# Patient Record
Sex: Male | Born: 1935
Health system: Southern US, Community
[De-identification: ages and names within clinical notes are randomized; demographics above are authoritative.]

## PROBLEM LIST (undated history)

## (undated) DIAGNOSIS — E559 Vitamin D deficiency, unspecified: Secondary | ICD-10-CM

## (undated) DIAGNOSIS — K649 Unspecified hemorrhoids: Secondary | ICD-10-CM

## (undated) DIAGNOSIS — J189 Pneumonia, unspecified organism: Secondary | ICD-10-CM

## (undated) DIAGNOSIS — K219 Gastro-esophageal reflux disease without esophagitis: Secondary | ICD-10-CM

## (undated) DIAGNOSIS — I10 Essential (primary) hypertension: Secondary | ICD-10-CM

## (undated) DIAGNOSIS — R972 Elevated prostate specific antigen [PSA]: Secondary | ICD-10-CM

## (undated) DIAGNOSIS — C801 Malignant (primary) neoplasm, unspecified: Secondary | ICD-10-CM

## (undated) DIAGNOSIS — E785 Hyperlipidemia, unspecified: Secondary | ICD-10-CM

## (undated) HISTORY — DX: Hyperlipidemia, unspecified: E78.5

## (undated) HISTORY — DX: Gastro-esophageal reflux disease without esophagitis: K21.9

## (undated) HISTORY — PX: HERNIA REPAIR: SHX51

## (undated) HISTORY — DX: Essential (primary) hypertension: I10

## (undated) HISTORY — DX: Elevated prostate specific antigen (PSA): R97.20

## (undated) HISTORY — DX: Vitamin D deficiency, unspecified: E55.9

---

## 1999-08-08 ENCOUNTER — Encounter (INDEPENDENT_AMBULATORY_CARE_PROVIDER_SITE_OTHER): Payer: Self-pay

## 1999-08-08 ENCOUNTER — Ambulatory Visit (HOSPITAL_COMMUNITY): Admission: RE | Admit: 1999-08-08 | Discharge: 1999-08-08 | Payer: Self-pay | Admitting: Gastroenterology

## 2008-01-08 ENCOUNTER — Ambulatory Visit (HOSPITAL_COMMUNITY): Admission: RE | Admit: 2008-01-08 | Discharge: 2008-01-08 | Payer: Self-pay | Admitting: Pulmonary Disease

## 2009-01-28 ENCOUNTER — Emergency Department (HOSPITAL_COMMUNITY): Admission: EM | Admit: 2009-01-28 | Discharge: 2009-01-28 | Payer: Self-pay | Admitting: Emergency Medicine

## 2009-04-25 LAB — HM COLONOSCOPY

## 2010-09-04 ENCOUNTER — Ambulatory Visit (HOSPITAL_COMMUNITY)
Admission: RE | Admit: 2010-09-04 | Discharge: 2010-09-04 | Payer: Self-pay | Source: Home / Self Care | Admitting: General Surgery

## 2010-12-19 LAB — DIFFERENTIAL
Eosinophils Absolute: 0.3 10*3/uL (ref 0.0–0.7)
Eosinophils Relative: 5 % (ref 0–5)
Lymphs Abs: 1.6 10*3/uL (ref 0.7–4.0)
Monocytes Absolute: 0.5 10*3/uL (ref 0.1–1.0)
Monocytes Relative: 9 % (ref 3–12)

## 2010-12-19 LAB — CBC
HCT: 42.4 % (ref 39.0–52.0)
Hemoglobin: 14.4 g/dL (ref 13.0–17.0)
MCH: 30.6 pg (ref 26.0–34.0)
MCV: 90.3 fL (ref 78.0–100.0)
RBC: 4.69 MIL/uL (ref 4.22–5.81)

## 2010-12-19 LAB — BASIC METABOLIC PANEL
CO2: 30 mEq/L (ref 19–32)
Chloride: 107 mEq/L (ref 96–112)
GFR calc Af Amer: >60 mL/min (ref >60)
Sodium: 143 mEq/L (ref 135–145)

## 2011-01-17 LAB — COMPREHENSIVE METABOLIC PANEL
Alkaline Phosphatase: 51 U/L (ref 39–117)
BUN: 14 mg/dL (ref 6–23)
Chloride: 100 mEq/L (ref 96–112)
GFR calc non Af Amer: >60 mL/min (ref >60)
Glucose, Bld: 135 mg/dL — ABNORMAL HIGH (ref 70–99)
Potassium: 3.9 mEq/L (ref 3.5–5.1)
Total Bilirubin: 1.1 mg/dL (ref 0.3–1.2)
Total Protein: 6.2 g/dL (ref 6.0–8.3)

## 2011-01-17 LAB — URINALYSIS, ROUTINE W REFLEX MICROSCOPIC
Glucose, UA: NEGATIVE mg/dL
Hgb urine dipstick: NEGATIVE
Protein, ur: NEGATIVE mg/dL

## 2011-01-17 LAB — DIFFERENTIAL
Basophils Absolute: 0 10*3/uL (ref 0.0–0.1)
Basophils Relative: 0 % (ref 0–1)
Neutro Abs: 6.8 10*3/uL (ref 1.7–7.7)
Neutrophils Relative %: 80 % — ABNORMAL HIGH (ref 43–77)

## 2011-01-17 LAB — CBC
HCT: 44.1 % (ref 39.0–52.0)
Hemoglobin: 15 g/dL (ref 13.0–17.0)
RDW: 13.1 % (ref 11.5–15.5)

## 2011-01-17 LAB — LACTIC ACID, PLASMA: Lactic Acid, Venous: 1.2 mmol/L (ref 0.5–2.2)

## 2011-01-17 LAB — LIPASE, BLOOD: Lipase: 20 U/L (ref 11–59)

## 2011-03-13 ENCOUNTER — Ambulatory Visit (HOSPITAL_COMMUNITY)
Admission: RE | Admit: 2011-03-13 | Discharge: 2011-03-13 | Disposition: A | Discharge status: Home / Self Care | Payer: Medicare Other | Source: Ambulatory Visit | Attending: Internal Medicine | Admitting: Internal Medicine

## 2011-03-13 ENCOUNTER — Other Ambulatory Visit (HOSPITAL_COMMUNITY): Payer: Self-pay | Admitting: Internal Medicine

## 2011-03-13 DIAGNOSIS — M546 Pain in thoracic spine: Secondary | ICD-10-CM | POA: Insufficient documentation

## 2011-03-13 DIAGNOSIS — M4120 Other idiopathic scoliosis, site unspecified: Secondary | ICD-10-CM | POA: Insufficient documentation

## 2011-03-13 DIAGNOSIS — M25519 Pain in unspecified shoulder: Secondary | ICD-10-CM | POA: Insufficient documentation

## 2011-03-13 DIAGNOSIS — I77810 Thoracic aortic ectasia: Secondary | ICD-10-CM | POA: Insufficient documentation

## 2011-03-13 DIAGNOSIS — R52 Pain, unspecified: Secondary | ICD-10-CM

## 2011-03-13 DIAGNOSIS — M479 Spondylosis, unspecified: Secondary | ICD-10-CM | POA: Insufficient documentation

## 2013-08-28 ENCOUNTER — Other Ambulatory Visit: Payer: Self-pay | Admitting: Physician Assistant

## 2013-08-28 MED ORDER — CLOTRIMAZOLE-BETAMETHASONE 1-0.05 % EX CREA: 1.0000 "application " | TOPICAL_CREAM | Freq: Two times a day (BID) | CUTANEOUS | Status: DC

## 2013-09-13 ENCOUNTER — Encounter: Payer: Self-pay | Admitting: Internal Medicine

## 2013-09-13 DIAGNOSIS — E782 Mixed hyperlipidemia: Secondary | ICD-10-CM | POA: Insufficient documentation

## 2013-09-13 DIAGNOSIS — R0989 Other specified symptoms and signs involving the circulatory and respiratory systems: Secondary | ICD-10-CM | POA: Insufficient documentation

## 2013-09-13 DIAGNOSIS — K219 Gastro-esophageal reflux disease without esophagitis: Secondary | ICD-10-CM | POA: Insufficient documentation

## 2013-09-13 DIAGNOSIS — E559 Vitamin D deficiency, unspecified: Secondary | ICD-10-CM | POA: Insufficient documentation

## 2013-09-13 DIAGNOSIS — R972 Elevated prostate specific antigen [PSA]: Secondary | ICD-10-CM | POA: Insufficient documentation

## 2013-09-14 ENCOUNTER — Encounter: Payer: Self-pay | Admitting: Internal Medicine

## 2013-09-14 ENCOUNTER — Ambulatory Visit (INDEPENDENT_AMBULATORY_CARE_PROVIDER_SITE_OTHER): Payer: Medicare Other | Admitting: Internal Medicine

## 2013-09-14 VITALS — BP 138/70 | HR 72 | Temp 97.9°F | Resp 18 | Wt 164.6 lb

## 2013-09-14 DIAGNOSIS — D485 Neoplasm of uncertain behavior of skin: Secondary | ICD-10-CM

## 2013-09-14 DIAGNOSIS — R7303 Prediabetes: Secondary | ICD-10-CM | POA: Insufficient documentation

## 2013-09-14 DIAGNOSIS — I10 Essential (primary) hypertension: Secondary | ICD-10-CM

## 2013-09-14 DIAGNOSIS — M839 Adult osteomalacia, unspecified: Secondary | ICD-10-CM

## 2013-09-14 DIAGNOSIS — E782 Mixed hyperlipidemia: Secondary | ICD-10-CM

## 2013-09-14 DIAGNOSIS — R7309 Other abnormal glucose: Secondary | ICD-10-CM

## 2013-09-14 DIAGNOSIS — Z79899 Other long term (current) drug therapy: Secondary | ICD-10-CM

## 2013-09-14 DIAGNOSIS — E559 Vitamin D deficiency, unspecified: Secondary | ICD-10-CM

## 2013-09-14 LAB — LIPID PANEL
Cholesterol: 149 mg/dL (ref 0–200)
HDL: 44 mg/dL (ref >39)
LDL Cholesterol: 74 mg/dL (ref 0–99)
Triglycerides: 155 mg/dL — ABNORMAL HIGH (ref <150)

## 2013-09-14 LAB — HEPATIC FUNCTION PANEL
Albumin: 3.7 g/dL (ref 3.5–5.2)
Alkaline Phosphatase: 65 U/L (ref 39–117)
Total Bilirubin: 1.3 mg/dL — ABNORMAL HIGH (ref 0.3–1.2)

## 2013-09-14 LAB — BASIC METABOLIC PANEL WITH GFR
Chloride: 104 mEq/L (ref 96–112)
Creat: 0.81 mg/dL (ref 0.50–1.35)
GFR, Est African American: >89 mL/min
GFR, Est Non African American: 85 mL/min
Potassium: 4.3 mEq/L (ref 3.5–5.3)

## 2013-09-14 LAB — CBC WITH DIFFERENTIAL/PLATELET
Basophils Absolute: 0 10*3/uL (ref 0.0–0.1)
Basophils Relative: 1 % (ref 0–1)
Lymphocytes Relative: 25 % (ref 12–46)
Neutro Abs: 3.8 10*3/uL (ref 1.7–7.7)
Platelets: 168 10*3/uL (ref 150–400)
RDW: 13.9 % (ref 11.5–15.5)
WBC: 6 10*3/uL (ref 4.0–10.5)

## 2013-09-14 LAB — HEMOGLOBIN A1C: Mean Plasma Glucose: 126 mg/dL — ABNORMAL HIGH (ref <117)

## 2013-09-14 NOTE — Patient Instructions (Signed)

## 2013-09-14 NOTE — Progress Notes (Signed)
Patient ID: Keith [name], male   DOB: 1931/10/20, 79 y.o.   MRN: [id number]   This very nice 79 yo MWM presents for 3 month follow up with Hypertension, Hyperlipidemia, Pre-Diabetes and Vitamin D Deficiency and also for excision of a suspicious skin lesion of his Lt upper back.    BP has been controlled at home. Today's BP is 138/70. Patient denies any cardiac type chest pain, palpitations, dyspnea/orthopnea/PND, dizziness, claudication, or dependent edema.   Hyperlipidemia is controlled with diet & meds. Last Cholesterol was 168, Triglycerides were 142, HDL 49 and LDL 83 in August - all at goal. Patient denies myalgias or other med SE's.    Also, the patient has history of PreDiabetes with last A1c of 5.8% in August ( was 5.9% in February). Patient denies any symptoms of reactive hypoglycemia, diabetic polys, paresthesias or visual blurring.   Further, Patient has history of Vitamin D Deficiency with last vitamin D of 60 in August (was 27 in 2009). Patient supplements vitamin D without any suspected side-effects.  Current Outpatient Prescriptions on File Prior to Visit  Medication Sig Dispense Refill  . atorvastatin (LIPITOR) 40 MG tablet Take 40 mg by mouth daily.      . Cholecalciferol (VITAMIN D PO) Take 2,000 Int'l Units by mouth daily.      . clotrimazole-betamethasone (LOTRISONE) cream Apply 1 application topically 2 (two) times daily.  15 g  2  . Ranitidine HCl (ZANTAC PO) Take by mouth daily.         Allergies  Allergen Reactions  . Penicillins     PMHx:   Past Medical History  Diagnosis Date  . Hypertension   . Hyperlipidemia   . GERD (gastroesophageal reflux disease)   . Vitamin D deficiency   . Elevated PSA     FHx:    Reviewed / unchanged  SHx:    Reviewed / unchanged  Systems Review: Constitutional: Denies fever, chills, wt changes, headaches, insomnia, fatigue, night sweats, change in appetite. Eyes: Denies redness, blurred vision, diplopia, discharge, itchy,  watery eyes.  ENT: Denies discharge, congestion, post nasal drip, epistaxis, sore throat, earache, hearing loss, dental pain, tinnitus, vertigo, sinus pain, snoring.  CV: Denies chest pain, palpitations, irregular heartbeat, syncope, dyspnea, diaphoresis, orthopnea, PND, claudication, edema. Respiratory: denies cough, dyspnea, DOE, pleurisy, hoarseness, laryngitis, wheezing.  Gastrointestinal: Denies dysphagia, odynophagia, heartburn, reflux, water brash, abdominal pain or cramps, nausea, vomiting, bloating, diarrhea, constipation, hematemesis, melena, hematochezia,  or hemorrhoids. Genitourinary: Denies dysuria, frequency, urgency, nocturia, hesitancy, discharge, hematuria, flank pain. Musculoskeletal: Denies arthralgias, myalgias, stiffness, jt. swelling, pain, limp, strain/sprain.  Skin: Denies pruritus, rash, hives, warts, acne, eczema, but does have suspicious skin lesion as above.. Neuro: No weakness, tremor, incoordination, spasms, paresthesia, or pain. Psychiatric: Denies confusion, memory loss, or sensory loss. Endo: Denies change in weight, skin, hair change.  Heme/Lymph: No excessive bleeding, bruising, or enlarged lymph nodes.  Filed Vitals:   09/14/13 0901  BP: 138/70  Pulse: 72  Temp: 97.9 F (36.6 C)  Resp: 18    There is no height on file to calculate BMI.  On Exam: Appears well nourished - in no distress. Eyes: PERRLA, EOMs, conjunctiva no swelling or erythema. Sinuses: No frontal/maxillary tenderness ENT/Mouth: EAC's clear, TM's nl w/o erythema, bulging. Nares clear w/o erythema, swelling, exudates. Oropharynx clear without erythema or exudates. Oral hygiene is good. Tongue normal, non obstructing. Hearing intact.  Neck: Supple. Thyroid nl. Car 2+/2+ without bruits, nodes or JVD. Chest: Respirations nl with BS clear &  equal w/o rales, rhonchi, wheezing or stridor.  Cor: Heart sounds normal w/ regular rate and rhythm without sig. murmurs, gallops, clicks, or rubs.  Peripheral pulses normal and equal  without edema.  Abdomen: Soft & bowel sounds normal. Non-tender w/o guarding, rebound, hernias, masses, or organomegaly.  Lymphatics: Unremarkable.  Musculoskeletal: Full ROM all peripheral extremities, joint stability, 5/5 strength, and normal gait.  Skin: Warm, dry without exposed rashes, lesions, ecchymosis apparent.  Neuro: Cranial nerves intact, reflexes equal bilaterally. Sensory-motor testing grossly intact. Tendon reflexes grossly intact.  Pysch: Alert & oriented x 3. Insight and judgement nl & appropriate. No ideations.  Assessment and Plan:  1. Hypertension - Continue monitor blood pressure at home. Continue diet/meds same.  2. Hyperlipidemia - Continue diet/meds, exercise,& lifestyle modifications. Continue monitor periodic cholesterol/liver & renal functions   3. Pre-diabetes/Insulin Resistance - Continue diet, exercise, lifestyle modifications. Monitor appropriate labs.  4. Vitamin D Deficiency - Continue supplementation.  Recommended regular exercise, BP monitoring, weight control, and discussed med and SE's. Recommended labs to assess and monitor clinical status. Further disposition pending results of labs.  ================================================================================  Exam: there is an 8 x 12 mm erythematous raised crusty lesion in the Left upper back area    The risks, benefits, indications, potential complications, and alternatives were explained to the patient.  Procedure Details  After informed consent and local anesthesia with xylocaine   marcaine with epi 1%, the area was prepped by sterile/aseptic technique with isopropyl alcohol.  A  # 15 blade was used to excise an elliptical area of skin approximately   12 mm x 18mm with an ~ 1-2 mm margin around the lesion. The lesion was sharply dissected and delivered for pathologic examination. The wound was closed with   using # 6 vertical mattress  sutures with Nylon  3-0.  Antibiotic ointment and a sterile dressing applied and covered with a 4" x 6" tegoderm gauzeThe specimen was sent for pathologic examination. The patient tolerated the procedure well with minimal blood loss.   Condition: Stable  Complications:  None  Diagnosis:  1. Skin lesion, uknown significance - r/o atypia   Procedure code: 16109   Plan: 1.Patient was instructed to keep the wound dry and covered for 24-48 hours and clean thereafter. 2. Warning signs of infection were reviewed & pt advised to call if any questions/problems.    3. Recommended that the patient use Tylenol, Advil or Aleve as needed for pain.   4. Return for suture removal in  2 weeks

## 2013-09-15 LAB — INSULIN, FASTING: Insulin fasting, serum: 12 u[IU]/mL (ref 3–28)

## 2013-09-15 LAB — VITAMIN D 25 HYDROXY (VIT D DEFICIENCY, FRACTURES): Vit D, 25-Hydroxy: 61 ng/mL (ref 30–89)

## 2013-09-16 ENCOUNTER — Telehealth: Payer: Self-pay | Admitting: *Deleted

## 2013-09-16 ENCOUNTER — Other Ambulatory Visit: Payer: Self-pay | Admitting: Internal Medicine

## 2013-09-16 DIAGNOSIS — K2100 Gastro-esophageal reflux disease with esophagitis, without bleeding: Secondary | ICD-10-CM

## 2013-09-16 DIAGNOSIS — E782 Mixed hyperlipidemia: Secondary | ICD-10-CM

## 2013-09-16 MED ORDER — ATORVASTATIN CALCIUM 40 MG PO TABS: 40.0000 mg | ORAL_TABLET | Freq: Every day | ORAL | Status: DC

## 2013-09-16 MED ORDER — RANITIDINE HCL 300 MG PO TABS: 300.0000 mg | ORAL_TABLET | Freq: Every day | ORAL | Status: DC

## 2013-09-16 NOTE — Telephone Encounter (Signed)
Message copied by Reggy Eye on Wed Sep 16, 2013 12:48 PM ------      Message from: Lucky Cowboy      Created: Tue Sep 15, 2013 11:21 PM       A1c 6.0% and ave glucose 126 in early diabetic range - need stricter diet - avoid sweets/candy- white rice- white potatoes - whote flour- pizza crust and white pasta -       Vegetarian pastas - brown/wild rice - sweet potatoes and multigrain breads ok      Vit D 61 - sl low ideal betw 70 to 100 - suggest add 2,000 units more on a daily basis       All else nl and ok ------

## 2013-09-28 ENCOUNTER — Ambulatory Visit: Payer: Commercial Managed Care - HMO | Admitting: Internal Medicine

## 2013-09-28 ENCOUNTER — Encounter: Payer: Self-pay | Admitting: Internal Medicine

## 2013-09-28 VITALS — BP 122/64 | HR 60 | Temp 97.7°F | Resp 16 | Wt 164.9 lb

## 2013-09-28 DIAGNOSIS — D485 Neoplasm of uncertain behavior of skin: Secondary | ICD-10-CM

## 2013-09-28 NOTE — Progress Notes (Signed)
Subjective:     Patient ID: Keith [name], male   DOB: July 05, 1932, 79 y.o.   MRN: [id number]  HPI   F/U post excisional Bx of lesion Left upper back - path report still pending   Review of Systems denies problems after the Bx     Objective:   Physical Exam Area of recent Bx appears clean and well healed w/o sign of infection - sutures removed and bacitracin applied and covered with 2"x2" gauze      Assessment:     Skin lesion of ukn significance (238.2)      Plan:     F/u pending path

## 2013-10-09 ENCOUNTER — Encounter: Payer: Self-pay | Admitting: Internal Medicine

## 2013-10-09 ENCOUNTER — Ambulatory Visit (INDEPENDENT_AMBULATORY_CARE_PROVIDER_SITE_OTHER): Payer: Medicare HMO | Admitting: Internal Medicine

## 2013-10-09 VITALS — BP 132/80 | HR 64 | Temp 98.1°F | Resp 18 | Wt 165.8 lb

## 2013-10-09 DIAGNOSIS — L723 Sebaceous cyst: Secondary | ICD-10-CM

## 2013-10-09 NOTE — Progress Notes (Signed)
Subjective:     Patient ID: Keith [name], male   DOB: 10-01-1932, 80 y.o.   MRN: [id number]  HPI patients presents for concern about recent wound site of Lt upper back appearing slightly red and concerned re: possible infection   Review of Systems no fever and no tenderness or drainage from the wound site     Objective:   Physical Exam    Wound site appears clean with slight  separation at incision and no tenderness , erythema, warmth, drainage or signs of cellulitis. There is normal appearing redness of healing at the wound margins . Wound base is clean and dry.     Assessment:     Healing wound by 2sd intention w/o signs of infection.    Plan:     Patient reassured and advised shower as usual and cleanse area with soap and water, blot dry & leave open to air. RTC as needed.

## 2013-10-23 ENCOUNTER — Encounter: Payer: Self-pay | Admitting: Physician Assistant

## 2013-10-23 ENCOUNTER — Ambulatory Visit (INDEPENDENT_AMBULATORY_CARE_PROVIDER_SITE_OTHER): Payer: Medicare HMO | Admitting: Physician Assistant

## 2013-10-23 VITALS — BP 130/62 | HR 60 | Temp 97.9°F | Resp 16 | Wt 165.0 lb

## 2013-10-23 DIAGNOSIS — L259 Unspecified contact dermatitis, unspecified cause: Secondary | ICD-10-CM

## 2013-10-23 MED ORDER — PERMETHRIN 5 % EX CREA: TOPICAL_CREAM | CUTANEOUS | Status: DC

## 2013-10-23 MED ORDER — PREDNISONE 20 MG PO TABS: ORAL_TABLET | ORAL | Status: DC

## 2013-10-23 MED ORDER — HYDROXYZINE HCL 25 MG PO TABS: 25.0000 mg | ORAL_TABLET | Freq: Four times a day (QID) | ORAL | Status: DC | PRN

## 2013-10-23 NOTE — Patient Instructions (Signed)
Please stop the Vitamin D, fish oil and the lipitor for now. Use dove soap and no dryer sheets.  Start the prednisone and follow up in 2-4 weeks.   Rash A rash is a change in the color or texture of your skin. There are many different types of rashes. You may have other problems that accompany your rash. CAUSES   Infections.  Allergic reactions. This can include allergies to pets or foods.  Certain medicines.  Exposure to certain chemicals, soaps, or cosmetics.  Heat.  Exposure to poisonous plants.  Tumors, both cancerous and noncancerous. SYMPTOMS   Redness.  Scaly skin.  Itchy skin.  Dry or cracked skin.  Bumps.  Blisters.  Pain. DIAGNOSIS  Your caregiver may do a physical exam to determine what type of rash you have. A skin sample (biopsy) may be taken and examined under a microscope. TREATMENT  Treatment depends on the type of rash you have. Your caregiver may prescribe certain medicines. For serious conditions, you may need to see a skin doctor (dermatologist). HOME CARE INSTRUCTIONS   Avoid the substance that caused your rash.  Do not scratch your rash. This can cause infection.  You may take cool baths to help stop itching.  Only take over-the-counter or prescription medicines as directed by your caregiver.  Keep all follow-up appointments as directed by your caregiver. SEEK IMMEDIATE MEDICAL CARE IF:  You have increasing pain, swelling, or redness.  You have a fever.  You have new or severe symptoms.  You have body aches, diarrhea, or vomiting.  Your rash is not better after 3 days. MAKE SURE YOU:  Understand these instructions.  Will watch your condition.  Will get help right away if you are not doing well or get worse. Document Released: 09/14/2002 Document Revised: 12/17/2011 Document Reviewed: 07/09/2011 [facility] Patient Information 2014 [place], [place].

## 2013-10-23 NOTE — Progress Notes (Signed)
   Subjective:    Patient ID: Keith [name], male    DOB: April 26, 1932, 80 y.o.   MRN: [id number]  Rash This is a recurrent problem. Episode onset: 2-3 months. The problem has been waxing and waning since onset. The rash is diffuse. The rash is characterized by itchiness and redness (grouped vesicles/macules). He was exposed to nothing. Pertinent negatives include no anorexia, congestion, cough, diarrhea, eye pain, facial edema, fatigue, fever, joint pain, nail changes, rhinorrhea, shortness of breath, sore throat or vomiting. Past treatments include topical steroids, anti-itch cream and antibiotic cream. The treatment provided mild relief.  ? Possible new addition of fish oil and increase in Vitamin D   Review of Systems  Constitutional: Negative.  Negative for fever, chills and fatigue.  HENT: Negative.  Negative for congestion, rhinorrhea and sore throat.   Eyes: Negative.  Negative for pain.  Respiratory: Negative.  Negative for cough and shortness of breath.   Cardiovascular: Negative.   Gastrointestinal: Negative.  Negative for vomiting, diarrhea and anorexia.  Endocrine: Negative.   Genitourinary: Negative.   Musculoskeletal: Negative.  Negative for joint pain.  Skin: Positive for rash and wound (nonhealing 15x8 mm ulcer on left back from mole removal). Negative for nail changes.  Neurological: Negative.   Hematological: Negative.        Objective:   Physical Exam  Constitutional: He is oriented to person, place, and time. He appears well-developed and well-nourished.  HENT:  Head: Normocephalic and atraumatic.  Right Ear: External ear normal.  Left Ear: External ear normal.  Mouth/Throat: Oropharynx is clear and moist.  Eyes: Conjunctivae and EOM are normal. Pupils are equal, round, and reactive to light.  Neck: Normal range of motion. Neck supple.  Cardiovascular: Normal rate, regular rhythm and normal heart sounds.   Pulmonary/Chest: Effort normal and breath sounds normal.   Abdominal: Soft. Bowel sounds are normal.  Musculoskeletal: Normal range of motion.  Neurological: He is alert and oriented to person, place, and time. No cranial nerve deficit.  Skin: Skin is warm and dry. Rash noted. Rash is maculopapular. No cyanosis. Nails show no clubbing.  nonhealing 15x8 mm erythematous ulcer without exudate/warmth on left back from mole removal In Dec.  Diffuse clustered macular/papular erythematous non blanchable rash along stomach, back, arms, legs.   Psychiatric: He has a normal mood and affect. His behavior is normal.       Assessment & Plan:  Contact dermatitis/Drug eruption - Plan: hydrOXYzine (ATARAX/VISTARIL) 25 MG tablet, predniSONE (DELTASONE) 20 MG tablet  Stop the fish oil, Vitamin D and Lipitor for now. Follow up in 2-4 weeks and we will restart the Lipitor first then Vitamin D, then maybe fish oil.

## 2013-10-27 ENCOUNTER — Other Ambulatory Visit: Payer: Self-pay

## 2013-10-27 DIAGNOSIS — L723 Sebaceous cyst: Secondary | ICD-10-CM

## 2013-10-27 DIAGNOSIS — Z1283 Encounter for screening for malignant neoplasm of skin: Secondary | ICD-10-CM

## 2013-11-12 ENCOUNTER — Encounter: Payer: Self-pay | Admitting: Physician Assistant

## 2013-11-12 ENCOUNTER — Ambulatory Visit (INDEPENDENT_AMBULATORY_CARE_PROVIDER_SITE_OTHER): Payer: Medicare HMO | Admitting: Physician Assistant

## 2013-11-12 VITALS — BP 120/62 | HR 64 | Temp 98.1°F | Resp 16 | Ht 71.5 in | Wt 164.0 lb

## 2013-11-12 DIAGNOSIS — L259 Unspecified contact dermatitis, unspecified cause: Secondary | ICD-10-CM

## 2013-11-12 DIAGNOSIS — R5381 Other malaise: Secondary | ICD-10-CM

## 2013-11-12 DIAGNOSIS — M2550 Pain in unspecified joint: Secondary | ICD-10-CM

## 2013-11-12 DIAGNOSIS — R5383 Other fatigue: Secondary | ICD-10-CM

## 2013-11-12 LAB — BASIC METABOLIC PANEL WITH GFR
BUN: 18 mg/dL (ref 6–23)
CO2: 29 mEq/L (ref 19–32)
CREATININE: 0.81 mg/dL (ref 0.50–1.35)
Calcium: 9.5 mg/dL (ref 8.4–10.5)
Chloride: 99 mEq/L (ref 96–112)
GFR, EST NON AFRICAN AMERICAN: 85 mL/min
Glucose, Bld: 88 mg/dL (ref 70–99)
Potassium: 4.7 mEq/L (ref 3.5–5.3)
SODIUM: 140 meq/L (ref 135–145)

## 2013-11-12 LAB — CBC WITH DIFFERENTIAL/PLATELET
BASOS ABS: 0 10*3/uL (ref 0.0–0.1)
BASOS PCT: 0 % (ref 0–1)
EOS ABS: 0.2 10*3/uL (ref 0.0–0.7)
Eosinophils Relative: 4 % (ref 0–5)
HCT: 46.4 % (ref 39.0–52.0)
HEMOGLOBIN: 15.5 g/dL (ref 13.0–17.0)
Lymphocytes Relative: 22 % (ref 12–46)
Lymphs Abs: 1.4 10*3/uL (ref 0.7–4.0)
MCH: 29.8 pg (ref 26.0–34.0)
MCHC: 33.4 g/dL (ref 30.0–36.0)
MCV: 89.2 fL (ref 78.0–100.0)
MONO ABS: 0.6 10*3/uL (ref 0.1–1.0)
MONOS PCT: 9 % (ref 3–12)
NEUTROS PCT: 65 % (ref 43–77)
Neutro Abs: 3.9 10*3/uL (ref 1.7–7.7)
Platelets: 158 10*3/uL (ref 150–400)
RBC: 5.2 MIL/uL (ref 4.22–5.81)
RDW: 14.1 % (ref 11.5–15.5)
WBC: 6.1 10*3/uL (ref 4.0–10.5)

## 2013-11-12 LAB — HEPATIC FUNCTION PANEL
ALT: 17 U/L (ref 0–53)
AST: 19 U/L (ref 0–37)
Albumin: 3.9 g/dL (ref 3.5–5.2)
Alkaline Phosphatase: 59 U/L (ref 39–117)
BILIRUBIN DIRECT: 0.2 mg/dL (ref 0.0–0.3)
BILIRUBIN TOTAL: 0.9 mg/dL (ref 0.2–1.2)
Indirect Bilirubin: 0.7 mg/dL (ref 0.2–1.2)
Total Protein: 6.4 g/dL (ref 6.0–8.3)

## 2013-11-12 LAB — SEDIMENTATION RATE: Sed Rate: 4 mm/hr (ref 0–16)

## 2013-11-12 MED ORDER — HYDROXYZINE HCL 25 MG PO TABS: 25.0000 mg | ORAL_TABLET | Freq: Four times a day (QID) | ORAL | Status: DC | PRN

## 2013-11-12 MED ORDER — DEXAMETHASONE SODIUM PHOSPHATE 100 MG/10ML IJ SOLN
10.0000 mg | Freq: Once | INTRAMUSCULAR | Status: AC
  Administered 2013-11-12: 10 mg via INTRAMUSCULAR

## 2013-11-12 NOTE — Progress Notes (Signed)
   Subjective:    Patient ID: Keith [name], male    DOB: Jan 03, 1932, 80 y.o.   MRN: [id number]  HPI Patient was here 10/23/2013 for recurrent rash. At that visit we stopped his lipitor, fish oil and vitamin D and put him on Prednisone and Atarax, he was doing better the first week but feels the rash is back on his back, ankles and arms. 8-9 months ago had a tick bit.    Review of Systems  Constitutional: Positive for fatigue. Negative for fever and chills.  HENT: Negative.  Negative for congestion, rhinorrhea and sore throat.   Eyes: Negative.  Negative for pain.  Respiratory: Negative.  Negative for cough and shortness of breath.   Cardiovascular: Negative.   Gastrointestinal: Negative.  Negative for vomiting, diarrhea and anorexia.  Endocrine: Negative.   Genitourinary: Negative.   Musculoskeletal: Negative.  Negative for joint pain.  Skin: Positive for rash and wound (nonhealing 15x8 mm ulcer on left back from mole removal). Negative for nail changes.  Neurological: Negative.   Hematological: Negative.        Objective:   Physical Exam  Constitutional: He is oriented to person, place, and time. He appears well-developed and well-nourished.  HENT:  Head: Normocephalic and atraumatic.  Right Ear: External ear normal.  Left Ear: External ear normal.  Mouth/Throat: Oropharynx is clear and moist.  Eyes: Conjunctivae and EOM are normal. Pupils are equal, round, and reactive to light.  Neck: Normal range of motion. Neck supple.  Cardiovascular: Normal rate, regular rhythm and normal heart sounds.   Pulmonary/Chest: Effort normal and breath sounds normal.  Abdominal: Soft. Bowel sounds are normal.  Musculoskeletal: Normal range of motion.  Neurological: He is alert and oriented to person, place, and time. No cranial nerve deficit.  Skin: Skin is warm and dry. Rash noted. Rash is maculopapular. No cyanosis. Nails show no clubbing.  Slowly healing 10x8 mm erythematous ulcer without  exudate/warmth on left back from mole removal In Dec.  Diffuse clustered macular/papular erythematous non blanchable rash along back, arms, legs with questionable burrows.   Psychiatric: He has a normal mood and affect. His behavior is normal.      Assessment & Plan:  Dermatitis/Scabies- get on elimite, wash clothes, Prednisone 56m IM, Atarax 272mrefill.   Will do referral to Derm and  if not better.   History of Tick bite with fatigue- check CBC, BMP, LFTs, ESR, Lyme and RMSF

## 2013-11-12 NOTE — Patient Instructions (Signed)
Scabies Scabies are small bugs (mites) that burrow under the skin and cause red bumps and severe itching. These bugs can only be seen with a microscope. Scabies are highly contagious. They can spread easily from person to person by direct contact. They are also spread through sharing clothing or linens that have the scabies mites living in them. It is not unusual for an entire family to become infected through shared towels, clothing, or bedding.  HOME CARE INSTRUCTIONS   Your caregiver may prescribe a cream or lotion to kill the mites. If cream is prescribed, massage the cream into the entire body from the neck to the bottom of both feet. Also massage the cream into the scalp and face if your child is less than 44 year old. Avoid the eyes and mouth. Do not wash your hands after application.  Leave the cream on for 8 to 12 hours. Your child should bathe or shower after the 8 to 12 hour application period. Sometimes it is helpful to apply the cream to your child right before bedtime.  One treatment is usually effective and will eliminate approximately 95% of infestations. For severe cases, your caregiver may decide to repeat the treatment in 1 week. Everyone in your household should be treated with one application of the cream.  New rashes or burrows should not appear within 24 to 48 hours after successful treatment. However, the itching and rash may last for 2 to 4 weeks after successful treatment. Your caregiver may prescribe a medicine to help with the itching or to help the rash go away more quickly.  Scabies can live on clothing or linens for up to 3 days. All of your child's recently used clothing, towels, stuffed toys, and bed linens should be washed in hot water and then dried in a dryer for at least 20 minutes on high heat. Items that cannot be washed should be enclosed in a plastic bag for at least 3 days.  To help relieve itching, bathe your child in a cool bath or apply cool washcloths to the  affected areas.  Your child may return to school after treatment with the prescribed cream. SEEK MEDICAL CARE IF:   The itching persists longer than 4 weeks after treatment.  The rash spreads or becomes infected. Signs of infection include red blisters or yellow-tan crust. Document Released: 09/24/2005 Document Revised: 12/17/2011 Document Reviewed: 02/02/2009 [facility] Patient Information 2014 [place]. Contact Dermatitis Contact dermatitis is a reaction to certain substances that touch the skin. Contact dermatitis can be either irritant contact dermatitis or allergic contact dermatitis. Irritant contact dermatitis does not require previous exposure to the substance for a reaction to occur.Allergic contact dermatitis only occurs if you have been exposed to the substance before. Upon a repeat exposure, your body reacts to the substance.  CAUSES  Many substances can cause contact dermatitis. Irritant dermatitis is most commonly caused by repeated exposure to mildly irritating substances, such as:  Makeup.  Soaps.  Detergents.  Bleaches.  Acids.  Metal salts, such as nickel. Allergic contact dermatitis is most commonly caused by exposure to:  Poisonous plants.  Chemicals (deodorants, shampoos).  Jewelry.  Latex.  Neomycin in triple antibiotic cream.  Preservatives in products, including clothing. SYMPTOMS  The area of skin that is exposed may develop:  Dryness or flaking.  Redness.  Cracks.  Itching.  Pain or a burning sensation.  Blisters. With allergic contact dermatitis, there may also be swelling in areas such as the eyelids, mouth, or  genitals.  DIAGNOSIS  Your caregiver can usually tell what the problem is by doing a physical exam. In cases where the cause is uncertain and an allergic contact dermatitis is suspected, a patch skin test may be performed to help determine the cause of your dermatitis. TREATMENT Treatment includes protecting the skin  from further contact with the irritating substance by avoiding that substance if possible. Barrier creams, powders, and gloves may be helpful. Your caregiver may also recommend:  Steroid creams or ointments applied 2 times daily. For best results, soak the rash area in cool water for 20 minutes. Then apply the medicine. Cover the area with a plastic wrap. You can store the steroid cream in the refrigerator for a "chilly" effect on your rash. That may decrease itching. Oral steroid medicines may be needed in more severe cases.  Antibiotics or antibacterial ointments if a skin infection is present.  Antihistamine lotion or an antihistamine taken by mouth to ease itching.  Lubricants to keep moisture in your skin.  Burow's solution to reduce redness and soreness or to dry a weeping rash. Mix one packet or tablet of solution in 2 cups cool water. Dip a clean washcloth in the mixture, wring it out a bit, and put it on the affected area. Leave the cloth in place for 30 minutes. Do this as often as possible throughout the day.  Taking several cornstarch or baking soda baths daily if the area is too large to cover with a washcloth. Harsh chemicals, such as alkalis or acids, can cause skin damage that is like a burn. You should flush your skin for 15 to 20 minutes with cold water after such an exposure. You should also seek immediate medical care after exposure. Bandages (dressings), antibiotics, and pain medicine may be needed for severely irritated skin.  HOME CARE INSTRUCTIONS  Avoid the substance that caused your reaction.  Keep the area of skin that is affected away from hot water, soap, sunlight, chemicals, acidic substances, or anything else that would irritate your skin.  Do not scratch the rash. Scratching may cause the rash to become infected.  You may take cool baths to help stop the itching.  Only take over-the-counter or prescription medicines as directed by your caregiver.  See your  caregiver for follow-up care as directed to make sure your skin is healing properly. SEEK MEDICAL CARE IF:   Your condition is not better after 3 days of treatment.  You seem to be getting worse.  You see signs of infection such as swelling, tenderness, redness, soreness, or warmth in the affected area.  You have any problems related to your medicines. Document Released: 09/21/2000 Document Revised: 12/17/2011 Document Reviewed: 02/27/2011 [facility] Patient Information 2014 [place], [place].

## 2013-11-13 LAB — LYME ABY, WSTRN BLT IGG & IGM W/BANDS
B burgdorferi IgG Abs (IB): NEGATIVE
B burgdorferi IgM Abs (IB): NEGATIVE
LYME DISEASE 39 KD IGG: NONREACTIVE
LYME DISEASE 41 KD IGG: NONREACTIVE
LYME DISEASE 41 KD IGM: NONREACTIVE
LYME DISEASE 66 KD IGG: NONREACTIVE
LYME DISEASE 93 KD IGG: NONREACTIVE
Lyme Disease 18 kD IgG: NONREACTIVE
Lyme Disease 23 kD IgG: NONREACTIVE
Lyme Disease 23 kD IgM: NONREACTIVE
Lyme Disease 28 kD IgG: NONREACTIVE
Lyme Disease 30 kD IgG: NONREACTIVE
Lyme Disease 39 kD IgM: NONREACTIVE
Lyme Disease 45 kD IgG: NONREACTIVE
Lyme Disease 58 kD IgG: NONREACTIVE

## 2013-11-13 LAB — ANTI-DNA ANTIBODY, DOUBLE-STRANDED: ds DNA Ab: 3 IU/mL

## 2013-11-13 LAB — ROCKY MTN SPOTTED FVR ABS PNL(IGG+IGM)
RMSF IGG: 0.12 IV
RMSF IGM: 0.06 IV

## 2013-11-13 LAB — ANA: ANA: NEGATIVE

## 2013-11-14 NOTE — Addendum Note (Signed)
Addended by: Vladimir Crofts on: 11/14/2013 08:34 AM   Modules accepted: Orders

## 2013-12-22 ENCOUNTER — Ambulatory Visit: Payer: Self-pay | Admitting: Emergency Medicine

## 2014-02-18 ENCOUNTER — Other Ambulatory Visit: Payer: Self-pay

## 2014-02-18 DIAGNOSIS — C3410 Malignant neoplasm of upper lobe, unspecified bronchus or lung: Secondary | ICD-10-CM

## 2014-03-02 ENCOUNTER — Ambulatory Visit (INDEPENDENT_AMBULATORY_CARE_PROVIDER_SITE_OTHER): Payer: Commercial Managed Care - HMO | Admitting: Internal Medicine

## 2014-03-02 ENCOUNTER — Other Ambulatory Visit: Payer: Self-pay | Admitting: *Deleted

## 2014-03-02 ENCOUNTER — Encounter: Payer: Self-pay | Admitting: Internal Medicine

## 2014-03-02 VITALS — BP 154/80 | HR 60 | Temp 97.7°F | Resp 18 | Ht 70.5 in | Wt 163.6 lb

## 2014-03-02 DIAGNOSIS — E782 Mixed hyperlipidemia: Secondary | ICD-10-CM

## 2014-03-02 DIAGNOSIS — Z1212 Encounter for screening for malignant neoplasm of rectum: Secondary | ICD-10-CM

## 2014-03-02 DIAGNOSIS — Z789 Other specified health status: Secondary | ICD-10-CM

## 2014-03-02 DIAGNOSIS — R7309 Other abnormal glucose: Secondary | ICD-10-CM

## 2014-03-02 DIAGNOSIS — I10 Essential (primary) hypertension: Secondary | ICD-10-CM

## 2014-03-02 DIAGNOSIS — E785 Hyperlipidemia, unspecified: Secondary | ICD-10-CM

## 2014-03-02 DIAGNOSIS — Z79899 Other long term (current) drug therapy: Secondary | ICD-10-CM

## 2014-03-02 DIAGNOSIS — Z1331 Encounter for screening for depression: Secondary | ICD-10-CM

## 2014-03-02 DIAGNOSIS — Z125 Encounter for screening for malignant neoplasm of prostate: Secondary | ICD-10-CM

## 2014-03-02 DIAGNOSIS — Z0000 Encounter for general adult medical examination without abnormal findings: Secondary | ICD-10-CM

## 2014-03-02 DIAGNOSIS — E559 Vitamin D deficiency, unspecified: Secondary | ICD-10-CM

## 2014-03-02 LAB — CBC WITH DIFFERENTIAL/PLATELET
Basophils Absolute: 0 10*3/uL (ref 0.0–0.1)
Basophils Relative: 0 % (ref 0–1)
EOS PCT: 4 % (ref 0–5)
Eosinophils Absolute: 0.2 10*3/uL (ref 0.0–0.7)
HEMATOCRIT: 43.2 % (ref 39.0–52.0)
Hemoglobin: 14.8 g/dL (ref 13.0–17.0)
Lymphocytes Relative: 36 % (ref 12–46)
Lymphs Abs: 2 10*3/uL (ref 0.7–4.0)
MCH: 29.5 pg (ref 26.0–34.0)
MCHC: 34.3 g/dL (ref 30.0–36.0)
MCV: 86.2 fL (ref 78.0–100.0)
MONOS PCT: 9 % (ref 3–12)
Monocytes Absolute: 0.5 10*3/uL (ref 0.1–1.0)
Neutro Abs: 2.8 10*3/uL (ref 1.7–7.7)
Neutrophils Relative %: 51 % (ref 43–77)
Platelets: 160 10*3/uL (ref 150–400)
RBC: 5.01 MIL/uL (ref 4.22–5.81)
RDW: 14.4 % (ref 11.5–15.5)
WBC: 5.5 10*3/uL (ref 4.0–10.5)

## 2014-03-02 LAB — HEMOGLOBIN A1C
Hgb A1c MFr Bld: 6 % — ABNORMAL HIGH (ref <5.7)
Mean Plasma Glucose: 126 mg/dL — ABNORMAL HIGH (ref <117)

## 2014-03-02 MED ORDER — RANITIDINE HCL 150 MG PO TABS: 150.0000 mg | ORAL_TABLET | Freq: Two times a day (BID) | ORAL | Status: DC

## 2014-03-02 MED ORDER — ATORVASTATIN CALCIUM 40 MG PO TABS: 40.0000 mg | ORAL_TABLET | Freq: Every day | ORAL | Status: DC

## 2014-03-02 NOTE — Progress Notes (Signed)
Patient ID: Keith [name], male   DOB: 10/29/1931, 80 y.o.   MRN: [id number]   Annual Screening Comprehensive Examination  This very nice 80 y.o.  MWM presents for complete physical.  Patient has been followed for HTN, Prediabetes, Hyperlipidemia, and Vitamin D Deficiency. Other problems include GERD controlled on meds .   HTN predates at least since 1991 when he presented w/ACS and underwent PTCA and has done well since with Cardiolyte in 2008 negative with EF 66%. Patient's BP has been controlled at home, altho today's BP: 154/80 mmHg. Patient denies any cardiac symptoms as chest pain, palpitations, shortness of breath, dizziness or ankle swelling.   Patient's hyperlipidemia is controlled with diet and medications. Patient denies myalgias or other medication SE's. Last lipids in Dec 2014 as below were at goal.   Lab Results  Component Value Date   CHOL 149 09/14/2013   HDL 44 09/14/2013   LDLCALC 74 09/14/2013   TRIG 155* 09/14/2013   CHOLHDL 3.4 09/14/2013    Patient has prediabetes with A1c 5.7% in nov 2013 and last A1c of 5.8% in Aug 2014. Patient denies reactive hypoglycemic symptoms, visual blurring, diabetic polys or paresthesias.    Finally, patient has history of Vitamin D Deficiency of  27 in 2009 and last vitamin D 63 in Aug 2014.   Medication List       aspirin 81 MG tablet  Take 81 mg by mouth daily.     atorvastatin 40 MG tablet  Commonly known as:  LIPITOR  Take 1 tablet (40 mg total) by mouth daily.     FISH OIL PO  Take by mouth daily.     ranitidine 150 MG tablet  Commonly known as:  ZANTAC  Take 1 tablet (150 mg total) by mouth 2 (two) times daily. Takes daily     triamcinolone ointment 0.1 %  Commonly known as:  KENALOG     VITAMIN D PO  Take 2,000 Int'l Units by mouth daily.       Allergies  Allergen Reactions  . Penicillins    Past Medical History  Diagnosis Date  . Hypertension   . Hyperlipidemia   . GERD (gastroesophageal reflux disease)   .  Vitamin D deficiency   . Elevated PSA     Past Surgical History  Procedure Laterality Date  . Hemorrhoid surgery  2011    Family History  Problem Relation Age of Onset  . Heart disease Mother   . Heart disease Father     History   Social History  . Marital Status: Married    Spouse Name: N/A    Number of Children: N/A  . Years of Education: N/A   Occupational History  . Retired Scientist, research (life sciences)   Social History Main Topics  . Smoking status: Former Research scientist (life sciences)  . Smokeless tobacco: Not on file  . Alcohol Use: Yes     Comment: rarely  . Drug Use: No  . Sexual Activity: Not on file    ROS Constitutional: Denies fever, chills, weight loss/gain, headaches, insomnia, fatigue, night sweats or change in appetite. Eyes: Denies redness, blurred vision, diplopia, discharge, itchy or watery eyes.  ENT: Denies discharge, congestion, post nasal drip, epistaxis, sore throat, earache, hearing loss, dental pain, Tinnitus, Vertigo, Sinus pain or snoring.  Cardio: Denies chest pain, palpitations, irregular heartbeat, syncope, dyspnea, diaphoresis, orthopnea, PND, claudication or edema Respiratory: denies cough, dyspnea, DOE, pleurisy, hoarseness, laryngitis or wheezing.  Gastrointestinal: Denies dysphagia, heartburn, reflux, water brash, pain, cramps, nausea,  vomiting, bloating, diarrhea, constipation, hematemesis, melena, hematochezia, jaundice or hemorrhoids Genitourinary: Denies dysuria, frequency, urgency, nocturia, hesitancy, discharge, hematuria or flank pain Musculoskeletal: Denies arthralgia, myalgia, stiffness, Jt. Swelling, pain, limp or strain/sprain. Skin: Denies puritis, rash, hives, warts, acne, eczema or change in skin lesion Neuro: No weakness, tremor, incoordination, spasms, paresthesia or pain Psychiatric: Denies confusion, memory loss or sensory loss Endocrine: Denies change in weight, skin, hair change, nocturia, and paresthesia, diabetic polys, visual blurring or hyper / hypo  glycemic episodes.  Heme/Lymph: No excessive bleeding, bruising or enlarged lymph nodes.  Physical Exam  BP 154/80  Pulse 60  Temp 97.7 F   Resp 18  Ht 5' 10.5"   Wt 163 lb 9.6 oz   BMI 23.13 kg/m2  General Appearance: Well nourished, in no apparent distress. Eyes: PERRLA, EOMs, conjunctiva no swelling or erythema, normal fundi and vessels. Sinuses: No frontal/maxillary tenderness ENT/Mouth: EACs patent / TMs  nl. Nares clear without erythema, swelling, mucoid exudates. Oral hygiene is good. No erythema, swelling, or exudate. Tongue normal, non-obstructing. Tonsils not swollen or erythematous. Hearing normal.  Neck: Supple, thyroid normal. No bruits, nodes or JVD. Respiratory: Respiratory effort normal.  BS equal and clear bilateral without rales, rhonci, wheezing or stridor. Cardio: Heart sounds are normal with regular rate and rhythm and no murmurs, rubs or gallops. Peripheral pulses are normal and equal bilaterally without edema. No aortic or femoral bruits. Chest: symmetric with normal excursions and percussion.  Abdomen: Flat, soft, with bowl sounds. Nontender, no guarding, rebound, hernias, masses, or organomegaly.  Lymphatics: Non tender without lymphadenopathy.  Genitourinary: No hernias.Testes nl. DRE - prostate nl for age - smooth & firm w/o nodules. Musculoskeletal: Full ROM all peripheral extremities, joint stability, 5/5 strength, and normal gait. Skin: Warm and dry without rashes, lesions, cyanosis, clubbing or  ecchymosis.  Neuro: Cranial nerves intact, reflexes equal bilaterally. Normal muscle tone, no cerebellar symptoms. Sensation intact.  Pysch: Awake and oriented X 3, normal affect, insight and judgment appropriate.   Assessment and Plan  1. Annual Screening Examination 2. Hypertension  3. Hyperlipidemia 4. Pre Diabetes 5. Vitamin D Deficiency 6. Hx/o Elevated PSA  Continue prudent diet as discussed, weight control, BP monitoring, regular exercise, and  medications as discussed.  Discussed med effects and SE's. Routine screening labs and tests as requested with regular follow-up as recommended.

## 2014-03-02 NOTE — Patient Instructions (Signed)

## 2014-03-03 LAB — MICROALBUMIN / CREATININE URINE RATIO
Creatinine, Urine: 141.3 mg/dL
MICROALB UR: 0.5 mg/dL (ref 0.00–1.89)
Microalb Creat Ratio: 3.5 mg/g (ref 0.0–30.0)

## 2014-03-03 LAB — LIPID PANEL
Cholesterol: 130 mg/dL (ref 0–200)
HDL: 47 mg/dL (ref >39)
LDL CALC: 63 mg/dL (ref 0–99)
Total CHOL/HDL Ratio: 2.8 Ratio
Triglycerides: 101 mg/dL (ref <150)
VLDL: 20 mg/dL (ref 0–40)

## 2014-03-03 LAB — BASIC METABOLIC PANEL WITH GFR
BUN: 15 mg/dL (ref 6–23)
CO2: 26 meq/L (ref 19–32)
CREATININE: 0.76 mg/dL (ref 0.50–1.35)
Calcium: 9 mg/dL (ref 8.4–10.5)
Chloride: 104 mEq/L (ref 96–112)
GFR, Est African American: >89 mL/min
GFR, Est Non African American: 87 mL/min
Glucose, Bld: 90 mg/dL (ref 70–99)
Potassium: 4.1 mEq/L (ref 3.5–5.3)
Sodium: 141 mEq/L (ref 135–145)

## 2014-03-03 LAB — HEPATIC FUNCTION PANEL
ALBUMIN: 3.9 g/dL (ref 3.5–5.2)
ALK PHOS: 56 U/L (ref 39–117)
ALT: 17 U/L (ref 0–53)
AST: 26 U/L (ref 0–37)
Bilirubin, Direct: 0.3 mg/dL (ref 0.0–0.3)
Indirect Bilirubin: 1.1 mg/dL (ref 0.2–1.2)
Total Bilirubin: 1.4 mg/dL — ABNORMAL HIGH (ref 0.2–1.2)
Total Protein: 6 g/dL (ref 6.0–8.3)

## 2014-03-03 LAB — URINALYSIS, MICROSCOPIC ONLY
BACTERIA UA: NONE SEEN
CASTS: NONE SEEN
Crystals: NONE SEEN
Squamous Epithelial / LPF: NONE SEEN

## 2014-03-03 LAB — TSH: TSH: 1.264 u[IU]/mL (ref 0.350–4.500)

## 2014-03-03 LAB — VITAMIN D 25 HYDROXY (VIT D DEFICIENCY, FRACTURES): Vit D, 25-Hydroxy: 73 ng/mL (ref 30–89)

## 2014-03-03 LAB — MAGNESIUM: Magnesium: 1.7 mg/dL (ref 1.5–2.5)

## 2014-03-03 LAB — INSULIN, FASTING: INSULIN FASTING, SERUM: 20 u[IU]/mL (ref 3–28)

## 2014-03-03 LAB — PSA: PSA: 4.08 ng/mL — ABNORMAL HIGH (ref <=4.00)

## 2014-03-15 ENCOUNTER — Other Ambulatory Visit (INDEPENDENT_AMBULATORY_CARE_PROVIDER_SITE_OTHER): Payer: Commercial Managed Care - HMO

## 2014-03-15 ENCOUNTER — Ambulatory Visit: Payer: Self-pay | Admitting: Internal Medicine

## 2014-03-15 DIAGNOSIS — Z1212 Encounter for screening for malignant neoplasm of rectum: Secondary | ICD-10-CM

## 2014-03-15 LAB — POC HEMOCCULT BLD/STL (HOME/3-CARD/SCREEN)
Card #2 Fecal Occult Blod, POC: NEGATIVE
FECAL OCCULT BLD: NEGATIVE
Fecal Occult Blood, POC: NEGATIVE

## 2014-06-04 ENCOUNTER — Ambulatory Visit: Payer: Self-pay | Admitting: Physician Assistant

## 2014-06-15 ENCOUNTER — Encounter: Payer: Self-pay | Admitting: Physician Assistant

## 2014-06-15 ENCOUNTER — Ambulatory Visit (INDEPENDENT_AMBULATORY_CARE_PROVIDER_SITE_OTHER): Payer: Commercial Managed Care - HMO | Admitting: Physician Assistant

## 2014-06-15 VITALS — BP 110/62 | HR 60 | Temp 97.9°F | Resp 16 | Ht 71.5 in | Wt 165.0 lb

## 2014-06-15 DIAGNOSIS — K219 Gastro-esophageal reflux disease without esophagitis: Secondary | ICD-10-CM

## 2014-06-15 DIAGNOSIS — E785 Hyperlipidemia, unspecified: Secondary | ICD-10-CM

## 2014-06-15 DIAGNOSIS — Z1331 Encounter for screening for depression: Secondary | ICD-10-CM

## 2014-06-15 DIAGNOSIS — Z79899 Other long term (current) drug therapy: Secondary | ICD-10-CM

## 2014-06-15 DIAGNOSIS — E559 Vitamin D deficiency, unspecified: Secondary | ICD-10-CM

## 2014-06-15 DIAGNOSIS — R7309 Other abnormal glucose: Secondary | ICD-10-CM

## 2014-06-15 DIAGNOSIS — N181 Chronic kidney disease, stage 1: Secondary | ICD-10-CM

## 2014-06-15 DIAGNOSIS — Z0000 Encounter for general adult medical examination without abnormal findings: Secondary | ICD-10-CM

## 2014-06-15 DIAGNOSIS — I10 Essential (primary) hypertension: Secondary | ICD-10-CM

## 2014-06-15 DIAGNOSIS — R972 Elevated prostate specific antigen [PSA]: Secondary | ICD-10-CM

## 2014-06-15 DIAGNOSIS — Z789 Other specified health status: Secondary | ICD-10-CM

## 2014-06-15 DIAGNOSIS — Z23 Encounter for immunization: Secondary | ICD-10-CM

## 2014-06-15 LAB — HEPATIC FUNCTION PANEL
ALK PHOS: 63 U/L (ref 39–117)
ALT: 18 U/L (ref 0–53)
AST: 23 U/L (ref 0–37)
Albumin: 4 g/dL (ref 3.5–5.2)
BILIRUBIN DIRECT: 0.3 mg/dL (ref 0.0–0.3)
BILIRUBIN INDIRECT: 1 mg/dL (ref 0.2–1.2)
BILIRUBIN TOTAL: 1.3 mg/dL — AB (ref 0.2–1.2)
Total Protein: 6.4 g/dL (ref 6.0–8.3)

## 2014-06-15 LAB — CBC WITH DIFFERENTIAL/PLATELET
BASOS ABS: 0 10*3/uL (ref 0.0–0.1)
BASOS PCT: 0 % (ref 0–1)
Eosinophils Absolute: 0.3 10*3/uL (ref 0.0–0.7)
Eosinophils Relative: 5 % (ref 0–5)
HCT: 42.5 % (ref 39.0–52.0)
Hemoglobin: 14.7 g/dL (ref 13.0–17.0)
LYMPHS PCT: 29 % (ref 12–46)
Lymphs Abs: 1.5 10*3/uL (ref 0.7–4.0)
MCH: 29.3 pg (ref 26.0–34.0)
MCHC: 34.6 g/dL (ref 30.0–36.0)
MCV: 84.7 fL (ref 78.0–100.0)
MONO ABS: 0.5 10*3/uL (ref 0.1–1.0)
Monocytes Relative: 10 % (ref 3–12)
NEUTROS ABS: 3 10*3/uL (ref 1.7–7.7)
NEUTROS PCT: 56 % (ref 43–77)
Platelets: 162 10*3/uL (ref 150–400)
RBC: 5.02 MIL/uL (ref 4.22–5.81)
RDW: 13.8 % (ref 11.5–15.5)
WBC: 5.3 10*3/uL (ref 4.0–10.5)

## 2014-06-15 LAB — LIPID PANEL
CHOL/HDL RATIO: 3.2 ratio
Cholesterol: 145 mg/dL (ref 0–200)
HDL: 45 mg/dL (ref >39)
LDL CALC: 73 mg/dL (ref 0–99)
Triglycerides: 135 mg/dL (ref <150)
VLDL: 27 mg/dL (ref 0–40)

## 2014-06-15 LAB — BASIC METABOLIC PANEL WITH GFR
BUN: 13 mg/dL (ref 6–23)
CHLORIDE: 104 meq/L (ref 96–112)
CO2: 29 mEq/L (ref 19–32)
Calcium: 9.2 mg/dL (ref 8.4–10.5)
Creat: 0.79 mg/dL (ref 0.50–1.35)
GFR, Est Non African American: 85 mL/min
Glucose, Bld: 105 mg/dL — ABNORMAL HIGH (ref 70–99)
POTASSIUM: 4.3 meq/L (ref 3.5–5.3)
SODIUM: 140 meq/L (ref 135–145)

## 2014-06-15 LAB — TSH: TSH: 1.339 u[IU]/mL (ref 0.350–4.500)

## 2014-06-15 LAB — HEMOGLOBIN A1C
Hgb A1c MFr Bld: 5.9 % — ABNORMAL HIGH (ref <5.7)
Mean Plasma Glucose: 123 mg/dL — ABNORMAL HIGH (ref <117)

## 2014-06-15 LAB — MAGNESIUM: Magnesium: 2 mg/dL (ref 1.5–2.5)

## 2014-06-15 NOTE — Patient Instructions (Addendum)
Dr. Amedeo Plenty Phone: [phone]- call to set up appointment  Preventative Care for Adults, Male       [place]:  A routine yearly physical is a good way to check in with your primary care provider about your health and preventive screening. It is also an opportunity to share updates about your health and any concerns you have, and receive a thorough all-over exam.   Most health insurance companies pay for at least some preventative services.  Check with your health plan for specific coverages.  WHAT PREVENTATIVE SERVICES DO MEN NEED?  Adult men should have their weight and blood pressure checked regularly.   Men age 78 and older should have their cholesterol levels checked regularly.  Beginning at age 78 and continuing to age 78, men should be screened for colorectal cancer.  Certain people should may need continued testing until age 78.  Other cancer screening may include exams for testicular and prostate cancer.  Updating vaccinations is part of preventative care.  Vaccinations help protect against diseases such as the flu.  Lab tests are generally done as part of preventative care to screen for anemia and blood disorders, to screen for problems with the kidneys and liver, to screen for bladder problems, to check blood sugar, and to check your cholesterol level.  Preventative services generally include counseling about diet, exercise, avoiding tobacco, drugs, excessive alcohol consumption, and sexually transmitted infections.    GENERAL RECOMMENDATIONS FOR GOOD HEALTH:  Healthy diet:  Eat a variety of foods, including fruit, vegetables, animal or vegetable protein, such as meat, fish, chicken, and eggs, or beans, lentils, tofu, and grains, such as rice.  Drink plenty of water daily.  Decrease saturated fat in the diet, avoid lots of red meat, processed foods, sweets, fast foods, and fried foods.  Exercise:  Aerobic exercise helps maintain good heart health. At least  30-40 minutes of moderate-intensity exercise is recommended. For example, a brisk walk that increases your heart rate and breathing. This should be done on most days of the week.   Find a type of exercise or a variety of exercises that you enjoy so that it becomes a part of your daily life.  Examples are running, walking, swimming, water aerobics, and biking.  For motivation and support, explore group exercise such as aerobic class, spin class, Zumba, Yoga,or  martial arts, etc.    Set exercise goals for yourself, such as a certain weight goal, walk or run in a race such as a 5k walk/run.  Speak to your primary care provider about exercise goals.  Disease prevention:  If you smoke or chew tobacco, find out from your caregiver how to quit. It can literally save your life, no matter how long you have been a tobacco user. If you do not use tobacco, never begin.   Maintain a healthy diet and normal weight. Increased weight leads to problems with blood pressure and diabetes.   The Body Mass Index or BMI is a way of measuring how much of your body is fat. Having a BMI above 27 increases the risk of heart disease, diabetes, hypertension, stroke and other problems related to obesity. Your caregiver can help determine your BMI and based on it develop an exercise and dietary program to help you achieve or maintain this important measurement at a healthful level.  High blood pressure causes heart and blood vessel problems.  Persistent high blood pressure should be treated with medicine if weight loss and exercise do not work.  Fat and cholesterol leaves deposits in your arteries that can block them. This causes heart disease and vessel disease elsewhere in your body.  If your cholesterol is found to be high, or if you have heart disease or certain other medical conditions, then you may need to have your cholesterol monitored frequently and be treated with medication.   Ask if you should have a stress test if  your history suggests this. A stress test is a test done on a treadmill that looks for heart disease. This test can find disease prior to there being a problem.  Avoid drinking alcohol in excess (more than two drinks per day).  Avoid use of street drugs. Do not share needles with anyone. Ask for professional help if you need assistance or instructions on stopping the use of alcohol, cigarettes, and/or drugs.  Brush your teeth twice a day with fluoride toothpaste, and floss once a day. Good oral hygiene prevents tooth decay and gum disease. The problems can be painful, unattractive, and can cause other health problems. Visit your dentist for a routine oral and dental check up and preventive care every 6-12 months.   Look at your skin regularly.  Use a mirror to look at your back. Notify your caregivers of changes in moles, especially if there are changes in shapes, colors, a size larger than a pencil eraser, an irregular border, or development of new moles.  Safety:  Use seatbelts 100% of the time, whether driving or as a passenger.  Use safety devices such as hearing protection if you work in environments with loud noise or significant background noise.  Use safety glasses when doing any work that could send debris in to the eyes.  Use a helmet if you ride a bike or motorcycle.  Use appropriate safety gear for contact sports.  Talk to your caregiver about gun safety.  Use sunscreen with a SPF (or skin protection factor) of 15 or greater.  Lighter skinned people are at a greater risk of skin cancer. Don't forget to also wear sunglasses in order to protect your eyes from too much damaging sunlight. Damaging sunlight can accelerate cataract formation.   Practice safe sex. Use condoms. Condoms are used for birth control and to help reduce the spread of sexually transmitted infections (or STIs).  Some of the STIs are gonorrhea (the clap), chlamydia, syphilis, trichomonas, herpes, HPV (human papilloma virus)  and HIV (human immunodeficiency virus) which causes AIDS. The herpes, HIV and HPV are viral illnesses that have no cure. These can result in disability, cancer and death.   Keep carbon monoxide and smoke detectors in your home functioning at all times. Change the batteries every 6 months or use a model that plugs into the wall.   Vaccinations:  Stay up to date with your tetanus shots and other required immunizations. You should have a booster for tetanus every 10 years. Be sure to get your flu shot every year, since 5%-20% of the [place] population comes down with the flu. The flu vaccine changes each year, so being vaccinated once is not enough. Get your shot in the fall, before the flu season peaks.   Other vaccines to consider:  Pneumococcal vaccine to protect against certain types of pneumonia.  This is normally recommended for adults age 63 or older.  However, adults younger than 78 years old with certain underlying conditions such as diabetes, heart or lung disease should also receive the vaccine.  Shingles vaccine to protect against Varicella Zoster if  you are older than age 9, or younger than 78 years old with certain underlying illness.  Hepatitis A vaccine to protect against a form of infection of the liver by a virus acquired from food.  Hepatitis B vaccine to protect against a form of infection of the liver by a virus acquired from blood or body fluids, particularly if you work in health care.  If you plan to travel internationally, check with your local health department for specific vaccination recommendations.  Cancer Screening:  Most routine colon cancer screening begins at the age of 39. On a yearly basis, doctors may provide special easy to use take-home tests to check for hidden blood in the stool. Sigmoidoscopy or colonoscopy can detect the earliest forms of colon cancer and is life saving. These tests use a small camera at the end of a tube to directly examine the colon. Speak  to your caregiver about this at age 79, when routine screening begins (and is repeated every 5 years unless early forms of pre-cancerous polyps or small growths are found).   At the age of 79 men usually start screening for prostate cancer every year. Screening may begin at a younger age for those with higher risk. Those at higher risk include African-Americans or having a family history of prostate cancer. There are two types of tests for prostate cancer:   Prostate-specific antigen (PSA) testing. Recent studies raise questions about prostate cancer using PSA and you should discuss this with your caregiver.   Digital rectal exam (in which your doctor's lubricated and gloved finger feels for enlargement of the prostate through the anus).   Screening for testicular cancer.  Do a monthly exam of your testicles. Gently roll each testicle between your thumb and fingers, feeling for any abnormal lumps. The best time to do this is after a hot shower or bath when the tissues are looser. Notify your caregivers of any lumps, tenderness or changes in size or shape immediately.

## 2014-06-15 NOTE — Progress Notes (Signed)
MEDICARE ANNUAL WELLNESS VISIT AND FOLLOW UP Assessment:   1. Essential hypertension - CBC with Differential - BASIC METABOLIC PANEL WITH GFR - Hepatic function panel - TSH  2. Encounter for long-term (current) use of other medications - Magnesium  3. Hyperlipidemia -continue medications, check lipids, decrease fatty foods, increase activity. - Lipid panel  4. PreDiabetes Discussed general issues about diabetes pathophysiology and management., Educational material distributed., Suggested low cholesterol diet., Encouraged aerobic exercise., Discussed foot care., Reminded to get yearly retinal exam. - Hemoglobin A1c - HM DIABETES FOOT EXAM  5. Vitamin D deficiency  6. Chronic kidney disease, stage 1 Check BMP, control HTN, Chol, Sugars  7. Need for prophylactic vaccination and inoculation against influenza - Flu vaccine HIGH DOSE PF  8. Need for prophylactic vaccination against Streptococcus pneumoniae (pneumococcus) - Pneumococcal conjugate vaccine 13-valent IM  9. Gastroesophageal reflux disease without esophagitis Diet discussed  10. Elevated PSA Stable  10. Need for colonoscopy Long discussion with patient, he is very healthy 80 year old, good quality of life, will get 1 more colonoscopy and then stop   Plan:   During the course of the visit the patient was educated and counseled about appropriate screening and preventive services including:    Pneumococcal vaccine   Influenza vaccine  Td vaccine  Screening electrocardiogram  Colorectal cancer screening  Diabetes screening  Glaucoma screening  Nutrition counseling   Screening recommendations, referrals: Vaccinations: Tdap vaccine not indicated Influenza vaccine ordered Pneumococcal vaccine ordered Shingles vaccine not indicated Hep B vaccine not indicated  Nutrition assessed and recommended  Colonoscopy requested Recommended yearly ophthalmology/optometry visit for glaucoma screening and  checkup Recommended yearly dental visit for hygiene and checkup Advanced directives - requested  Conditions/risks identified: BMI: Discussed weight loss, diet, and increase physical activity.  Increase physical activity: AHA recommends 150 minutes of physical activity a week.  Medications reviewed Diabetes is at goal, ACE/ARB therapy: Yes. Urinary Incontinence is not an issue: discussed non pharmacology and pharmacology options.  Fall risk: low- discussed PT, home fall assessment, medications.    Subjective:  Keith [name] is a 80 y.o. male who presents for Medicare Annual Wellness Visit and 3 month follow up for HTN, hyperlipidemia, prediabetes, and vitamin D Def.  Date of last medicare wellness visit was is unknown.  80 y.o. male  presents for 3 month follow up with hypertension, hyperlipidemia, prediabetes and vitamin D. His blood pressure has been controlled at home, today their BP is BP: 110/62 mmHg He does workout, walks and goes to the [facility] . He denies chest pain, shortness of breath, dizziness.  He is on cholesterol medication and denies myalgias. His cholesterol is at goal. The cholesterol last visit was:   Lab Results  Component Value Date   CHOL 130 03/02/2014   HDL 47 03/02/2014   LDLCALC 63 03/02/2014   TRIG 101 03/02/2014   CHOLHDL 2.8 03/02/2014   He has been working on diet and exercise for prediabetes, and denies paresthesia of the feet, polydipsia and polyuria. Last A1C in the office was:  Lab Results  Component Value Date   HGBA1C 6.0* 03/02/2014   Patient is on Vitamin D supplement.   Lab Results  Component Value Date   VD25OH 73 03/02/2014      Names of Other Physician/Practitioners you currently use: 1. [facility] here for primary care Patient Care Team: Unk Pinto, MD as PCP - General (Internal Medicine) Dyke Maes, OD (Optometry) Jorja Loa, MD as  Consulting Physician (Urology) Arsenio Loader, MD  as Referring Physician (Orthopedic Surgery) Missy Sabins, MD as Consulting Physician (Gastroenterology)  Medication Review: Current Outpatient Prescriptions on File Prior to Visit  Medication Sig Dispense Refill  . aspirin 81 MG tablet Take 81 mg by mouth daily.      Marland Kitchen atorvastatin (LIPITOR) 40 MG tablet Take 1 tablet (40 mg total) by mouth daily.  90 tablet  4  . Cholecalciferol (VITAMIN D PO) Take 2,000 Int'l Units by mouth daily.      . Omega-3 Fatty Acids (FISH OIL PO) Take by mouth daily.      . ranitidine (ZANTAC) 150 MG tablet Take 1 tablet (150 mg total) by mouth 2 (two) times daily. Takes daily  180 tablet  4  . triamcinolone ointment (KENALOG) 0.1 %        No current facility-administered medications on file prior to visit.    Current Problems (verified) Patient Active Problem List   Diagnosis Date Noted  . Encounter for long-term (current) use of other medications 03/02/2014  . PreDiabetes 09/14/2013  . Hypertension   . Chronic kidney disease   . Hyperlipidemia   . GERD (gastroesophageal reflux disease)   . Vitamin D deficiency   . Elevated PSA     Screening Tests Health Maintenance  Topic Date Due  . Pneumococcal Polysaccharide Vaccine Age 73 And Over  05/06/1999  . Influenza Vaccine  05/08/2014  . Colonoscopy  04/26/2019  . Tetanus/tdap  02/10/2020  . Zostavax  Completed    Immunization History  Administered Date(s) Administered  . Influenza Split 07/29/2013  . Td 02/09/2010  . Zoster 10/08/2000    Preventative care: Last colonoscopy: 2010 due 2015  Prior vaccinations: TD or Tdap: 2011  Influenza: 2014 DUE  Pneumococcal: 2004 Shingles/Zostavax: 2002  History reviewed: allergies, current medications, past family history, past medical history, past social history, past surgical history and problem list   Risk Factors: Tobacco History  Substance Use Topics  . Smoking status: Former Research scientist (life sciences)  . Smokeless tobacco: Not on file  . Alcohol Use: Yes      Comment: rarely   He does not smoke.  Patient is not a former smoker. Are there smokers in your home (other than you)?  No  Alcohol Current alcohol use: social drinker  Caffeine Current caffeine use: coffee 1-2 /day  Exercise Current exercise: walking  Nutrition/Diet Current diet: in general, a "healthy" diet    Cardiac risk factors: advanced age (older than 1 for men, 72 for women), hypertension and male gender.  Depression Screen (Note: if answer to either of the following is "Yes", a more complete depression screening is indicated)   Q1: Over the past two weeks, have you felt down, depressed or hopeless? No  Q2: Over the past two weeks, have you felt little interest or pleasure in doing things? No  Have you lost interest or pleasure in daily life? No  Do you often feel hopeless? No  Do you cry easily over simple problems? No  Activities of Daily Living In your present state of health, do you have any difficulty performing the following activities?:  Driving? No Managing money?  No Feeding yourself? No Getting from bed to chair? No Climbing a flight of stairs? No Preparing food and eating?: No Bathing or showering? No Getting dressed: No Getting to the toilet? No Using the toilet:No Moving around from place to place: No In the past year have you fallen or had a near fall?:No  Are you sexually active?  No  Do you have more than one partner?  No  Vision Difficulties: No  Hearing Difficulties: No Do you often ask people to speak up or repeat themselves? No Do you experience ringing or noises in your ears? No Do you have difficulty understanding soft or whispered voices? No  Cognition  Do you feel that you have a problem with memory?No  Do you often misplace items? No  Do you feel safe at home?  Yes  Advanced directives Does patient have a [place]? Yes Does patient have a Living Will? Yes   Objective:   Blood pressure 110/62,  pulse 60, temperature 97.9 F (36.6 C), resp. rate 16, height 5' 11.5" (1.816 m), weight 165 lb (74.844 kg). Body mass index is 22.69 kg/(m^2).  General appearance: alert, no distress, WD/WN, male Cognitive Testing  Alert? Yes  Normal Appearance?Yes  Oriented to person? Yes  Place? Yes   Time? Yes  Recall of three objects?  Yes  Can perform simple calculations? Yes  Displays appropriate judgment?Yes  Can read the correct time from a watch face?Yes  HEENT: normocephalic, sclerae anicteric, TMs pearly, nares patent, no discharge or erythema, pharynx normal Oral cavity: MMM, no lesions Neck: supple, no lymphadenopathy, no thyromegaly, no masses Heart: RRR, normal S1, S2, no murmurs Lungs: CTA bilaterally, no wheezes, rhonchi, or rales Abdomen: +bs, soft, non tender, non distended, no masses, no hepatomegaly, no splenomegaly Musculoskeletal: nontender, no swelling, no obvious deformity Extremities: no edema, no cyanosis, no clubbing Pulses: 2+ symmetric, upper and lower extremities, normal cap refill Neurological: alert, oriented x 3, CN2-12 intact, strength normal upper extremities and lower extremities, sensation normal throughout, DTRs 2+ throughout, no cerebellar signs, gait normal Psychiatric: normal affect, behavior normal, pleasant   Medicare Attestation I have personally reviewed: The patient's medical and social history Their use of alcohol, tobacco or illicit drugs Their current medications and supplements The patient's functional ability including ADLs,fall risks, home safety risks, cognitive, and hearing and visual impairment Diet and physical activities Evidence for depression or mood disorders  The patient's weight, height, BMI, and visual acuity have been recorded in the chart.  I have made referrals, counseling, and provided education to the patient based on review of the above and I have provided the patient with a written personalized care plan for preventive  services.     Vicie Mutters, PA-C   06/15/2014

## 2014-09-13 ENCOUNTER — Ambulatory Visit: Payer: Self-pay | Admitting: Internal Medicine

## 2014-09-19 NOTE — Progress Notes (Signed)
Patient ID: Keith [name], male   DOB: April 05, 1932, 80 y.o.   MRN: [id number]   This very nice 80 y.o.MWM presents for 3 month follow up with Hypertension, Hyperlipidemia, Pre-Diabetes and Vitamin D Deficiency.    Patient is treated for HTN & BP has been controlled at home. Today's BP: 110/64 mmHg. Patient did have a PTCA in 1991 and a negative Cardiolite in 2008.  Patient has had no complaints of any cardiac type chest pain, palpitations, dyspnea/orthopnea/PND, dizziness, claudication, or dependent edema.   Hyperlipidemia is controlled with diet & meds. Patient denies myalgias or other med SE's. Last Lipids were at goal -  Total Chol 145; HDL  45; LDL  73; Trig 135 on 06/15/2014.   Also, the patient has history of PreDiabetes and has had no symptoms of reactive hypoglycemia, diabetic polys, paresthesias or visual blurring.  Last A1c was  5.9% on  06/15/2014   Further, the patient also has history of Vitamin D Deficiency and supplements vitamin D without any suspected side-effects. Last vitamin D was  73 on 03/02/2014.   Patient does report occasional diarrhea 3-4 x week since taking Fish Oil & Magnesium.   Medication List   atorvastatin 40 MG tablet  Commonly known as:  LIPITOR  Take 1 tablet (40 mg total) by mouth daily.     FISH OIL PO  Take by mouth daily.     ranitidine 150 MG tablet  Commonly known as:  ZANTAC  Take 1 tablet (150 mg total) by mouth 2 (two) times daily. Takes daily     triamcinolone ointment 0.1 %  Commonly known as:  KENALOG     VITAMIN D PO  Take 2,000 Int'l Units by mouth daily.     Allergies  Allergen Reactions  . Penicillins    PMHx:   Past Medical History  Diagnosis Date  . Hypertension   . Hyperlipidemia   . GERD (gastroesophageal reflux disease)   . Vitamin D deficiency   . Elevated PSA    Immunization History  Administered Date(s) Administered  . Influenza Split 07/29/2013  . Influenza, High Dose Seasonal PF 06/15/2014  . Pneumococcal  Conjugate-13 06/15/2014  . Td 02/09/2010  . Zoster 10/08/2000   Past Surgical History  Procedure Laterality Date  . Hemorrhoid surgery  2011   FHx:    Reviewed / unchanged  SHx:    Reviewed / unchanged  Systems Review:  Constitutional: Denies fever, chills, wt changes, headaches, insomnia, fatigue, night sweats, change in appetite. Eyes: Denies redness, blurred vision, diplopia, discharge, itchy, watery eyes.  ENT: Denies discharge, congestion, post nasal drip, epistaxis, sore throat, earache, hearing loss, dental pain, tinnitus, vertigo, sinus pain, snoring.  CV: Denies chest pain, palpitations, irregular heartbeat, syncope, dyspnea, diaphoresis, orthopnea, PND, claudication or edema. Respiratory: denies cough, dyspnea, DOE, pleurisy, hoarseness, laryngitis, wheezing.  Gastrointestinal: Denies dysphagia, odynophagia, heartburn, reflux, water brash, abdominal pain or cramps, nausea, vomiting, bloating, diarrhea, constipation, hematemesis, melena, hematochezia  or hemorrhoids. Genitourinary: Denies dysuria, frequency, urgency, nocturia, hesitancy, discharge, hematuria or flank pain. Musculoskeletal: Denies arthralgias, myalgias, stiffness, jt. swelling, pain, limping or strain/sprain.  Skin: Denies pruritus, rash, hives, warts, acne, eczema or change in skin lesion(s). Neuro: No weakness, tremor, incoordination, spasms, paresthesia or pain. Psychiatric: Denies confusion, memory loss or sensory loss. Endo: Denies change in weight, skin or hair change.  Heme/Lymph: No excessive bleeding, bruising or enlarged lymph nodes.  Physical Exam  BP 110/64  Pulse 76  Temp98.1 F   Resp 16  Ht 5' 11.5"   Wt 169 lb 6.4 oz   BMI 23.30   Appears well nourished and in no distress. Eyes: PERRLA, EOMs, conjunctiva no swelling or erythema. Sinuses: No frontal/maxillary tenderness ENT/Mouth: EAC's clear, TM's nl w/o erythema, bulging. Nares clear w/o erythema, swelling, exudates. Oropharynx clear  without erythema or exudates. Oral hygiene is good. Tongue normal, non obstructing. Hearing intact.  Neck: Supple. Thyroid nl. Car 2+/2+ without bruits, nodes or JVD. Chest: Respirations nl with BS clear & equal w/o rales, rhonchi, wheezing or stridor.  Cor: Heart sounds normal w/ regular rate and rhythm without sig. murmurs, gallops, clicks, or rubs. Peripheral pulses normal and equal  without edema.  Abdomen: Soft & bowel sounds normal. Non-tender w/o guarding, rebound, hernias, masses, or organomegaly.  Lymphatics: Unremarkable.  Musculoskeletal: Full ROM all peripheral extremities, joint stability, 5/5 strength, and normal gait.  Skin: Warm, dry without exposed rashes, lesions or ecchymosis apparent.  Neuro: Cranial nerves intact, reflexes equal bilaterally. Sensory-motor testing grossly intact. Tendon reflexes grossly intact.  Pysch: Alert & oriented x 3.  Insight and judgement nl & appropriate. No ideations.  Assessment and Plan:  1. Hypertension - Continue monitor blood pressure at home. Continue diet/meds same.  2. Hyperlipidemia - Continue diet/meds, exercise,& lifestyle modifications. Continue monitor periodic cholesterol/liver & renal functions   3. Pre-Diabetes - Continue diet, exercise, lifestyle modifications. Monitor appropriate labs.  4. Vitamin D Deficiency - Continue supplementation.   Recommended regular exercise, BP monitoring, weight control, and discussed med and SE's. Recommended labs to assess and monitor clinical status. Further disposition pending results of labs. Advised to withhold either and or both FO & Mg to evaluate for contribution toward diarrhea.

## 2014-09-19 NOTE — Patient Instructions (Signed)

## 2014-09-20 ENCOUNTER — Ambulatory Visit (INDEPENDENT_AMBULATORY_CARE_PROVIDER_SITE_OTHER): Payer: Commercial Managed Care - HMO | Admitting: Internal Medicine

## 2014-09-20 ENCOUNTER — Encounter: Payer: Self-pay | Admitting: Internal Medicine

## 2014-09-20 VITALS — BP 110/64 | HR 76 | Temp 98.1°F | Resp 16 | Ht 71.5 in | Wt 169.4 lb

## 2014-09-20 DIAGNOSIS — I10 Essential (primary) hypertension: Secondary | ICD-10-CM

## 2014-09-20 DIAGNOSIS — E785 Hyperlipidemia, unspecified: Secondary | ICD-10-CM

## 2014-09-20 DIAGNOSIS — E559 Vitamin D deficiency, unspecified: Secondary | ICD-10-CM

## 2014-09-20 DIAGNOSIS — Z79899 Other long term (current) drug therapy: Secondary | ICD-10-CM

## 2014-09-20 DIAGNOSIS — R7309 Other abnormal glucose: Secondary | ICD-10-CM

## 2014-09-20 DIAGNOSIS — R7303 Prediabetes: Secondary | ICD-10-CM

## 2014-09-21 LAB — BASIC METABOLIC PANEL WITH GFR
BUN: 13 mg/dL (ref 6–23)
CHLORIDE: 105 meq/L (ref 96–112)
CO2: 29 mEq/L (ref 19–32)
Calcium: 9.2 mg/dL (ref 8.4–10.5)
Creat: 0.77 mg/dL (ref 0.50–1.35)
GFR, Est African American: >89 mL/min
GFR, Est Non African American: 86 mL/min
GLUCOSE: 115 mg/dL — AB (ref 70–99)
POTASSIUM: 4.2 meq/L (ref 3.5–5.3)
SODIUM: 140 meq/L (ref 135–145)

## 2014-09-21 LAB — CBC WITH DIFFERENTIAL/PLATELET
Basophils Absolute: 0 10*3/uL (ref 0.0–0.1)
Basophils Relative: 0 % (ref 0–1)
Eosinophils Absolute: 0.2 10*3/uL (ref 0.0–0.7)
Eosinophils Relative: 5 % (ref 0–5)
HEMATOCRIT: 43.6 % (ref 39.0–52.0)
Hemoglobin: 14.6 g/dL (ref 13.0–17.0)
LYMPHS ABS: 1.6 10*3/uL (ref 0.7–4.0)
Lymphocytes Relative: 33 % (ref 12–46)
MCH: 29.1 pg (ref 26.0–34.0)
MCHC: 33.5 g/dL (ref 30.0–36.0)
MCV: 86.9 fL (ref 78.0–100.0)
MONOS PCT: 6 % (ref 3–12)
MPV: 10.4 fL (ref 9.4–12.4)
Monocytes Absolute: 0.3 10*3/uL (ref 0.1–1.0)
NEUTROS PCT: 56 % (ref 43–77)
Neutro Abs: 2.7 10*3/uL (ref 1.7–7.7)
Platelets: 154 10*3/uL (ref 150–400)
RBC: 5.02 MIL/uL (ref 4.22–5.81)
RDW: 14.3 % (ref 11.5–15.5)
WBC: 4.8 10*3/uL (ref 4.0–10.5)

## 2014-09-21 LAB — INSULIN, FASTING: Insulin fasting, serum: 76.3 u[IU]/mL — ABNORMAL HIGH (ref 2.0–19.6)

## 2014-09-21 LAB — HEPATIC FUNCTION PANEL
ALK PHOS: 63 U/L (ref 39–117)
ALT: 24 U/L (ref 0–53)
AST: 25 U/L (ref 0–37)
Albumin: 3.6 g/dL (ref 3.5–5.2)
BILIRUBIN DIRECT: 0.3 mg/dL (ref 0.0–0.3)
BILIRUBIN INDIRECT: 0.9 mg/dL (ref 0.2–1.2)
Total Bilirubin: 1.2 mg/dL (ref 0.2–1.2)
Total Protein: 6.1 g/dL (ref 6.0–8.3)

## 2014-09-21 LAB — LIPID PANEL
CHOLESTEROL: 148 mg/dL (ref 0–200)
HDL: 44 mg/dL (ref >39)
LDL CALC: 77 mg/dL (ref 0–99)
TRIGLYCERIDES: 135 mg/dL (ref <150)
Total CHOL/HDL Ratio: 3.4 Ratio
VLDL: 27 mg/dL (ref 0–40)

## 2014-09-21 LAB — TSH: TSH: 1.556 u[IU]/mL (ref 0.350–4.500)

## 2014-09-21 LAB — MAGNESIUM: Magnesium: 1.6 mg/dL (ref 1.5–2.5)

## 2014-09-21 LAB — HEMOGLOBIN A1C
Hgb A1c MFr Bld: 5.8 % — ABNORMAL HIGH (ref <5.7)
Mean Plasma Glucose: 120 mg/dL — ABNORMAL HIGH (ref <117)

## 2014-09-21 LAB — VITAMIN D 25 HYDROXY (VIT D DEFICIENCY, FRACTURES): Vit D, 25-Hydroxy: 51 ng/mL (ref 30–100)

## 2014-10-04 ENCOUNTER — Encounter: Payer: Self-pay | Admitting: Physician Assistant

## 2014-10-04 ENCOUNTER — Ambulatory Visit (INDEPENDENT_AMBULATORY_CARE_PROVIDER_SITE_OTHER): Payer: Commercial Managed Care - HMO | Admitting: Physician Assistant

## 2014-10-04 ENCOUNTER — Ambulatory Visit (HOSPITAL_COMMUNITY)
Admission: RE | Admit: 2014-10-04 | Discharge: 2014-10-04 | Disposition: A | Discharge status: Home / Self Care | Payer: Commercial Managed Care - HMO | Source: Ambulatory Visit | Attending: Physician Assistant | Admitting: Physician Assistant

## 2014-10-04 VITALS — BP 138/64 | HR 58 | Temp 98.2°F | Resp 16 | Ht 71.5 in | Wt 166.0 lb

## 2014-10-04 DIAGNOSIS — R05 Cough: Secondary | ICD-10-CM | POA: Insufficient documentation

## 2014-10-04 DIAGNOSIS — R059 Cough, unspecified: Secondary | ICD-10-CM

## 2014-10-04 DIAGNOSIS — J209 Acute bronchitis, unspecified: Secondary | ICD-10-CM

## 2014-10-04 MED ORDER — PROMETHAZINE-CODEINE 6.25-10 MG/5ML PO SYRP: 5.0000 mL | ORAL_SOLUTION | Freq: Four times a day (QID) | ORAL | Status: DC | PRN

## 2014-10-04 MED ORDER — PREDNISONE 10 MG PO TABS: ORAL_TABLET | ORAL | Status: AC

## 2014-10-04 MED ORDER — AZITHROMYCIN 250 MG PO TABS: ORAL_TABLET | ORAL | Status: AC

## 2014-10-04 MED ORDER — ALBUTEROL SULFATE 108 (90 BASE) MCG/ACT IN AEPB: 2.0000 | INHALATION_SPRAY | Freq: Four times a day (QID) | RESPIRATORY_TRACT | Status: DC | PRN

## 2014-10-04 NOTE — Patient Instructions (Addendum)
-Take Z-Pak as prescribed. -Take Prednisone as prescribed for inflammation -take Promethazine-codeine as prescribed for cough. -Please get chest x-ray and I will call you with results. -Take Proair Respiclick as prescribed for shortness of breath  Make sure you are drinking plenty of water to stay hydrated.  If you are not better in 10-14 days, then please call the office If chest x-ray comes back pneumonia, then come back in 1 week.  Acute Bronchitis Bronchitis is inflammation of the airways that extend from the windpipe into the lungs (bronchi). The inflammation often causes mucus to develop. This leads to a cough, which is the most common symptom of bronchitis.  In acute bronchitis, the condition usually develops suddenly and goes away over time, usually in a couple weeks. Smoking, allergies, and asthma can make bronchitis worse. Repeated episodes of bronchitis may cause further lung problems.  CAUSES Acute bronchitis is most often caused by the same virus that causes a cold. The virus can spread from person to person (contagious) through coughing, sneezing, and touching contaminated objects. SIGNS AND SYMPTOMS   Cough.   Fever.   Coughing up mucus.   Body aches.   Chest congestion.   Chills.   Shortness of breath.   Sore throat.  DIAGNOSIS  Acute bronchitis is usually diagnosed through a physical exam. Your health care provider will also ask you questions about your medical history. Tests, such as chest X-rays, are sometimes done to rule out other conditions.  TREATMENT  Acute bronchitis usually goes away in a couple weeks. Oftentimes, no medical treatment is necessary. Medicines are sometimes given for relief of fever or cough. Antibiotic medicines are usually not needed but may be prescribed in certain situations. In some cases, an inhaler may be recommended to help reduce shortness of breath and control the cough. A cool mist vaporizer may also be used to help thin  bronchial secretions and make it easier to clear the chest.  HOME CARE INSTRUCTIONS  Get plenty of rest.   Drink enough fluids to keep your urine clear or pale yellow (unless you have a medical condition that requires fluid restriction). Increasing fluids may help thin your respiratory secretions (sputum) and reduce chest congestion, and it will prevent dehydration.   Take medicines only as directed by your health care provider.  If you were prescribed an antibiotic medicine, finish it all even if you start to feel better.  Avoid smoking and secondhand smoke. Exposure to cigarette smoke or irritating chemicals will make bronchitis worse. If you are a smoker, consider using nicotine gum or skin patches to help control withdrawal symptoms. Quitting smoking will help your lungs heal faster.   Reduce the chances of another bout of acute bronchitis by washing your hands frequently, avoiding people with cold symptoms, and trying not to touch your hands to your mouth, nose, or eyes.   Keep all follow-up visits as directed by your health care provider.  SEEK MEDICAL CARE IF: Your symptoms do not improve after 1 week of treatment.  SEEK IMMEDIATE MEDICAL CARE IF:  You develop an increased fever or chills.   You have chest pain.   You have severe shortness of breath.  You have bloody sputum.   You develop dehydration.  You faint or repeatedly feel like you are going to pass out.  You develop repeated vomiting.  You develop a severe headache. MAKE SURE YOU:   Understand these instructions.  Will watch your condition.  Will get help right away if you  are not doing well or get worse. Document Released: 11/01/2004 Document Revised: 02/08/2014 Document Reviewed: 03/17/2013 [facility] Patient Information 2015 [place], [place]. This information is not intended to replace advice given to you by your health care provider. Make sure you discuss any questions you have with your health care  provider.

## 2014-10-04 NOTE — Progress Notes (Signed)
Subjective:    Patient ID: Keith [name], male    DOB: March 20, 1932, 80 y.o.   MRN: [id number]  Cough This is a new problem. Episode onset: 1 week ago. The problem has been unchanged. The problem occurs constantly. Cough characteristics: light yellow color. Associated symptoms include headaches, postnasal drip, rhinorrhea and a sore throat. Pertinent negatives include no ear pain, rash, shortness of breath or wheezing. Treatments tried: cough medication, mucinex. The treatment provided no relief.  Former Smoker- Quit 1966 GFR= 86 on 09/20/14 Review of Systems  Constitutional: Negative.   HENT: Positive for congestion, postnasal drip, rhinorrhea, sore throat and trouble swallowing. Negative for ear discharge, ear pain and sinus pressure.   Respiratory: Positive for cough. Negative for chest tightness, shortness of breath and wheezing.   Cardiovascular: Negative.   Gastrointestinal: Negative.   Skin: Negative.  Negative for rash.  Neurological: Positive for headaches. Negative for dizziness and light-headedness.   Past Medical History  Diagnosis Date  . Hypertension   . Hyperlipidemia   . GERD (gastroesophageal reflux disease)   . Vitamin D deficiency   . Elevated PSA    Current Outpatient Prescriptions on File Prior to Visit  Medication Sig Dispense Refill  . aspirin 81 MG tablet Take 81 mg by mouth daily.    Marland Kitchen atorvastatin (LIPITOR) 40 MG tablet Take 1 tablet (40 mg total) by mouth daily. 90 tablet 4  . Cholecalciferol (VITAMIN D PO) Take 2,000 Int'l Units by mouth daily.    . Magnesium 250 MG TABS Take 1 tablet by mouth.    . Omega-3 Fatty Acids (FISH OIL PO) Take by mouth daily.    . ranitidine (ZANTAC) 150 MG tablet Take 1 tablet (150 mg total) by mouth 2 (two) times daily. Takes daily 180 tablet 4  . triamcinolone ointment (KENALOG) 0.1 %      No current facility-administered medications on file prior to visit.   Allergies  Allergen Reactions  . Penicillins      BP 138/64  mmHg  Pulse 58  Temp(Src) 98.2 F (36.8 C) (Temporal)  Resp 16  Ht 5' 11.5" (1.816 m)  Wt 166 lb (75.297 kg)  BMI 22.83 kg/m2  SpO2 97% Wt Readings from Last 3 Encounters:  10/04/14 166 lb (75.297 kg)  09/20/14 169 lb 6.4 oz (76.839 kg)  06/15/14 165 lb (74.844 kg)   Objective:   Physical Exam  Constitutional: He is oriented to person, place, and time. He appears well-developed and well-nourished. He has a sickly appearance. No distress.  HENT:  Head: Normocephalic.  Right Ear: External ear and ear canal normal. Tympanic membrane is bulging.  Left Ear: External ear and ear canal normal. Tympanic membrane is bulging.  Nose: Nose normal. No mucosal edema. Right sinus exhibits no maxillary sinus tenderness and no frontal sinus tenderness. Left sinus exhibits no maxillary sinus tenderness and no frontal sinus tenderness.  Mouth/Throat: Uvula is midline and mucous membranes are normal. Mucous membranes are not pale and not dry. No trismus in the jaw. No uvula swelling. Posterior oropharyngeal erythema present. No oropharyngeal exudate, posterior oropharyngeal edema or tonsillar abscesses.  TMs bulging with clear fluid and normal light reflexes bilaterally.  TMs non-erythematous and non-edematous bilaterally. Turbinates erythematous bilaterally.  Eyes: Conjunctivae and lids are normal. Pupils are equal, round, and reactive to light. Right eye exhibits no discharge. Left eye exhibits no discharge. No scleral icterus.  Neck: Trachea normal, normal range of motion and phonation normal. Neck supple. No tracheal tenderness present. No  tracheal deviation present.  Cardiovascular: Normal rate, regular rhythm, S1 normal, S2 normal, normal heart sounds and normal pulses.  Exam reveals no gallop, no distant heart sounds and no friction rub.   No murmur heard. Pulmonary/Chest: Effort normal. No stridor. No respiratory distress. He has no decreased breath sounds. He has no wheezes. He has no rhonchi. He  has rales. He exhibits no tenderness.  Rales in all lung fields.  Abdominal: Soft. Bowel sounds are normal. There is no tenderness. There is no rebound and no guarding.  Lymphadenopathy:  No tenderness or LAD.  Neurological: He is alert and oriented to person, place, and time. Gait normal.  Skin: Skin is warm, dry and intact. No rash noted. He is not diaphoretic. No pallor.  Psychiatric: He has a normal mood and affect. His speech is normal and behavior is normal. Judgment and thought content normal. Cognition and memory are normal.  Vitals reviewed.  Assessment & Plan:  1. Acute bronchitis, unspecified organism -Take Z-Pak as prescribed- azithromycin (ZITHROMAX) 250 MG tablet; Take 2 tablets PO on day 1, then 1 tablet PO Q24H x 4 days  Dispense: 6 tablet; Refill: 1 - Take Prednisone as prescribed for inflammation- predniSONE (DELTASONE) 10 MG tablet; Take 3 tablets PO for 2 days, then take 2 tablets PO for 2 days, then take 1 tablet PO for 3 days.  Dispense: 13 tablet; Refill: 0 -Take Promethazine-Codeine as prescribed for cough-  promethazine-codeine (PHENERGAN WITH CODEINE) 6.25-10 MG/5ML syrup; Take 5 mLs by mouth every 6 (six) hours as needed for cough. Max: 20mL per day  Dispense: 240 mL; Refill: 0 - Take Proair Respiclick as prescribed to help open airways- Albuterol Sulfate (PROAIR RESPICLICK) 108 (90 BASE) MCG/ACT AEPB; Inhale 2 puffs into the lungs every 6 (six) hours as needed.  Dispense: 1 each; Refill: 1  2. Cough Ordered chest x-ray to R/O Pneumonia - DG Chest 2 View; Future  Discussed medication effects and SE's.  Pt agreed to treatment plan. If you are not feeling better in 10-14 days, then please call the office. Please keep your follow up appt on 12/23/14.  Addendum:  Chest x-ray showed: No acute cardiopulmonary abnormality seen.  Continue medications as prescribed.    Owin Vignola, Lise Auer, PA-C 3:05 PM [facility]

## 2014-12-23 ENCOUNTER — Encounter: Payer: Self-pay | Admitting: Physician Assistant

## 2014-12-23 ENCOUNTER — Ambulatory Visit (INDEPENDENT_AMBULATORY_CARE_PROVIDER_SITE_OTHER): Payer: Commercial Managed Care - HMO | Admitting: Physician Assistant

## 2014-12-23 VITALS — BP 120/70 | HR 64 | Temp 97.8°F | Resp 16 | Ht 71.5 in | Wt 170.0 lb

## 2014-12-23 DIAGNOSIS — I10 Essential (primary) hypertension: Secondary | ICD-10-CM

## 2014-12-23 DIAGNOSIS — E559 Vitamin D deficiency, unspecified: Secondary | ICD-10-CM

## 2014-12-23 DIAGNOSIS — Z79899 Other long term (current) drug therapy: Secondary | ICD-10-CM

## 2014-12-23 DIAGNOSIS — R7303 Prediabetes: Secondary | ICD-10-CM

## 2014-12-23 DIAGNOSIS — R7309 Other abnormal glucose: Secondary | ICD-10-CM

## 2014-12-23 DIAGNOSIS — E785 Hyperlipidemia, unspecified: Secondary | ICD-10-CM

## 2014-12-23 DIAGNOSIS — N181 Chronic kidney disease, stage 1: Secondary | ICD-10-CM

## 2014-12-23 LAB — LIPID PANEL
CHOL/HDL RATIO: 3 ratio
Cholesterol: 142 mg/dL (ref 0–200)
HDL: 48 mg/dL (ref >=40)
LDL CALC: 73 mg/dL (ref 0–99)
TRIGLYCERIDES: 105 mg/dL (ref <150)
VLDL: 21 mg/dL (ref 0–40)

## 2014-12-23 LAB — CBC WITH DIFFERENTIAL/PLATELET
BASOS ABS: 0.1 10*3/uL (ref 0.0–0.1)
Basophils Relative: 1 % (ref 0–1)
Eosinophils Absolute: 0.3 10*3/uL (ref 0.0–0.7)
Eosinophils Relative: 5 % (ref 0–5)
HCT: 43.4 % (ref 39.0–52.0)
Hemoglobin: 14.6 g/dL (ref 13.0–17.0)
LYMPHS PCT: 33 % (ref 12–46)
Lymphs Abs: 1.7 10*3/uL (ref 0.7–4.0)
MCH: 29.7 pg (ref 26.0–34.0)
MCHC: 33.6 g/dL (ref 30.0–36.0)
MCV: 88.4 fL (ref 78.0–100.0)
MONO ABS: 0.4 10*3/uL (ref 0.1–1.0)
MPV: 10.2 fL (ref 8.6–12.4)
Monocytes Relative: 8 % (ref 3–12)
NEUTROS ABS: 2.7 10*3/uL (ref 1.7–7.7)
Neutrophils Relative %: 53 % (ref 43–77)
Platelets: 148 10*3/uL — ABNORMAL LOW (ref 150–400)
RBC: 4.91 MIL/uL (ref 4.22–5.81)
RDW: 13.4 % (ref 11.5–15.5)
WBC: 5 10*3/uL (ref 4.0–10.5)

## 2014-12-23 LAB — HEPATIC FUNCTION PANEL
ALK PHOS: 61 U/L (ref 39–117)
ALT: 17 U/L (ref 0–53)
AST: 23 U/L (ref 0–37)
Albumin: 3.7 g/dL (ref 3.5–5.2)
Bilirubin, Direct: 0.2 mg/dL (ref 0.0–0.3)
Indirect Bilirubin: 0.9 mg/dL (ref 0.2–1.2)
Total Bilirubin: 1.1 mg/dL (ref 0.2–1.2)
Total Protein: 6.3 g/dL (ref 6.0–8.3)

## 2014-12-23 LAB — BASIC METABOLIC PANEL WITH GFR
BUN: 17 mg/dL (ref 6–23)
CALCIUM: 9 mg/dL (ref 8.4–10.5)
CO2: 28 mEq/L (ref 19–32)
CREATININE: 0.85 mg/dL (ref 0.50–1.35)
Chloride: 107 mEq/L (ref 96–112)
GFR, Est Non African American: 82 mL/min
GLUCOSE: 104 mg/dL — AB (ref 70–99)
POTASSIUM: 4.5 meq/L (ref 3.5–5.3)
Sodium: 141 mEq/L (ref 135–145)

## 2014-12-23 LAB — TSH: TSH: 1.698 u[IU]/mL (ref 0.350–4.500)

## 2014-12-23 LAB — MAGNESIUM: Magnesium: 1.7 mg/dL (ref 1.5–2.5)

## 2014-12-23 LAB — HEMOGLOBIN A1C
Hgb A1c MFr Bld: 5.6 % (ref <5.7)
Mean Plasma Glucose: 114 mg/dL (ref <117)

## 2014-12-23 NOTE — Progress Notes (Signed)
Assessment and Plan:  1. Hypertension -Continue medication, monitor blood pressure at home. Continue DASH diet.  Reminder to go to the ER if any CP, SOB, nausea, dizziness, severe HA, changes vision/speech, left arm numbness and tingling and jaw pain.  2. Cholesterol -Continue diet and exercise. Check cholesterol.   3. Prediabetes  -Continue diet and exercise. Check A1C  4. Vitamin D Def - check level and continue medications.   5. Pharyngitis No lymphadenopathy, dysphagia, or worrisome findings, does have allergies with post nasal drip and GERD history- will switch claritin to allegra and increase zantac to 300 BID for 2 weeks then 300 mg QD. If not better or worsening sx's in 4 weeks will refer to ENT for evaluation with remote smoking history.   Continue diet and meds as discussed. Further disposition pending results of labs.  HPI 81 y.o. male  presents for 3 month follow up   has a past medical history of Hypertension; Hyperlipidemia; GERD (gastroesophageal reflux disease); Vitamin D deficiency; and Elevated PSA.  His blood pressure has been controlled at home, today their BP is BP: 120/70 mmHg  He does not workout. He denies chest pain, shortness of breath, dizziness.  He is on cholesterol medication and denies myalgias. His cholesterol is at goal. The cholesterol last visit was:   Lab Results  Component Value Date   CHOL 148 09/20/2014   HDL 44 09/20/2014   LDLCALC 77 09/20/2014   TRIG 135 09/20/2014   CHOLHDL 3.4 09/20/2014   He has been working on diet and exercise for prediabetes, and denies paresthesia of the feet, polydipsia, polyuria and visual disturbances. Last A1C in the office was:  Lab Results  Component Value Date   HGBA1C 5.8* 09/20/2014  Patient is on Vitamin D supplement.   Lab Results  Component Value Date   VD25OH 26 09/20/2014   He states that he had a cold in the winter, got ABX and cold resolved but the sore throat has not. He continues to have left  sided throat tenderness/scratchiness. No globulus sensation, no dysphagia, no food/pills/liquid getting caught. He does have post nasal drip, and uses peppermint candy for irritation. Worse at the day goes on. On zantac 150 once a day and will occ take a 2nd pill. Denies hoarseness, weight loss, night sweats.  He is not on an ACE, history of smoking x 7-8 years Q 1966.   Current Medications:  Current Outpatient Prescriptions on File Prior to Visit  Medication Sig Dispense Refill  . Albuterol Sulfate (PROAIR RESPICLICK) 811 (90 BASE) MCG/ACT AEPB Inhale 2 puffs into the lungs every 6 (six) hours as needed. 1 each 1  . aspirin 81 MG tablet Take 81 mg by mouth daily.    Marland Kitchen atorvastatin (LIPITOR) 40 MG tablet Take 1 tablet (40 mg total) by mouth daily. 90 tablet 4  . Cholecalciferol (VITAMIN D PO) Take 2,000 Int'l Units by mouth daily.    . Magnesium 250 MG TABS Take 1 tablet by mouth.    . Omega-3 Fatty Acids (FISH OIL PO) Take by mouth daily.    . promethazine-codeine (PHENERGAN WITH CODEINE) 6.25-10 MG/5ML syrup Take 5 mLs by mouth every 6 (six) hours as needed for cough. Max: 42mL per day 240 mL 0  . ranitidine (ZANTAC) 150 MG tablet Take 1 tablet (150 mg total) by mouth 2 (two) times daily. Takes daily 180 tablet 4  . triamcinolone ointment (KENALOG) 0.1 %      No current facility-administered medications on file  prior to visit.   Medical History:  Past Medical History  Diagnosis Date  . Hypertension   . Hyperlipidemia   . GERD (gastroesophageal reflux disease)   . Vitamin D deficiency   . Elevated PSA    Allergies:  Allergies  Allergen Reactions  . Penicillins      Review of Systems:  Review of Systems  Constitutional: Negative.  Negative for fever, chills, weight loss and malaise/fatigue.  HENT: Positive for congestion and sore throat. Negative for ear discharge, ear pain, hearing loss, nosebleeds and tinnitus.   Eyes: Negative.   Respiratory: Negative.  Negative for cough,  shortness of breath, wheezing and stridor.   Cardiovascular: Negative.   Gastrointestinal: Positive for heartburn and diarrhea (with addition of magnesiium). Negative for nausea, vomiting, abdominal pain, constipation, blood in stool and melena.  Genitourinary: Negative.   Musculoskeletal: Negative.   Skin: Negative.   Neurological: Negative.  Negative for headaches.  Endo/Heme/Allergies: Negative.   Psychiatric/Behavioral: Negative.     Family history- Review and unchanged Social history- Review and unchanged Physical Exam: BP 120/70 mmHg  Pulse 64  Temp(Src) 97.8 F (36.6 C)  Resp 16  Ht 5' 11.5" (1.816 m)  Wt 170 lb (77.111 kg)  BMI 23.38 kg/m2 Wt Readings from Last 3 Encounters:  12/23/14 170 lb (77.111 kg)  10/04/14 166 lb (75.297 kg)  09/20/14 169 lb 6.4 oz (76.839 kg)   General Appearance: Well nourished, in no apparent distress. Eyes: PERRLA, EOMs, conjunctiva no swelling or erythema Sinuses: No Frontal/maxillary tenderness ENT/Mouth: Ext aud canals clear, TMs without erythema, bulging. No erythema, swelling, or exudate on post pharynx.  Tonsils not swollen or erythematous. Hearing normal.  Neck: Supple, thyroid normal.  Respiratory: Respiratory effort normal, BS equal bilaterally without rales, rhonchi, wheezing or stridor.  Cardio: RRR with no MRGs. Brisk peripheral pulses without edema.  Abdomen: Soft, + BS, mild tenderness epigastric, no guarding, rebound, hernias, masses. Lymphatics: Non tender WITHOUT lymphadenopathy.  Musculoskeletal: Full ROM, 5/5 strength, Normal gait Skin: Warm, dry without rashes, lesions, ecchymosis.  Neuro: Cranial nerves intact. Normal muscle tone, no cerebellar symptoms. Psych: Awake and oriented X 3, normal affect, Insight and Judgment appropriate.    Vicie Mutters, PA-C 9:35 AM [facility]

## 2014-12-23 NOTE — Patient Instructions (Signed)
Four common causes of cough/hoarseness/sorethroat:   Allergies, Viral Infections, Acid Reflux and Bacterial Infections. 1) Allergies and viral infections cause a cough or SORE THROAT by post nasal drip and are often worse at night, can also have sneezing, lower grade fevers, clear/yellow mucus. This is best treated with allergy medications or nasal sprays.  Please get on allegra for 1-2 weeks The strongest is allegra or fexafinadine  Cheapest at [employer], [employer], [employer]  2) Silent reflux/GERD can cause a cough/SORE THROAT/or hoarseness WITHOUT heart burn because the esophagus that goes to the stomach and trachea that goes to the lungs are very close and when you lay down the acid can irritate your throat and lungs. This can cause hoarseness, cough, and wheezing. Please stop any alcohol or anti-inflammatories like aleve/advil/ibuprofen and start an over the counter Zantac 300mg  1 pill twice a day for 2 weeks, then switch to over the counter zantac/ratinidine  once at night for 2 weeks.   4) sometimes irritation causes more irritation. Try voice rest, use sugar free cough drops to prevent coughing, and try to stop clearing your throat.   If you ever have a cough that does not go away after trying these things please make a follow up visit for further evaluation or we can refer you to a specialist. Or if you ever have shortness of breath or chest pain go to the ER.       Bad carbs also include fruit juice, alcohol, and sweet tea. These are empty calories that do not signal to your brain that you are full.   Please remember the good carbs are still carbs which convert into sugar. So please measure them out no more than 1/2-1 cup of rice, oatmeal, pasta, and beans  Veggies are however free foods! Pile them on.   Not all fruit is created equal. Please see the list below, the fruit at the bottom is higher in sugars than the fruit at the top. Please avoid all dried fruits.

## 2014-12-24 LAB — VITAMIN D 25 HYDROXY (VIT D DEFICIENCY, FRACTURES): Vit D, 25-Hydroxy: 44 ng/mL (ref 30–100)

## 2015-01-14 ENCOUNTER — Other Ambulatory Visit: Payer: Self-pay | Admitting: Internal Medicine

## 2015-01-14 DIAGNOSIS — R070 Pain in throat: Secondary | ICD-10-CM

## 2015-03-04 ENCOUNTER — Encounter: Payer: Self-pay | Admitting: Internal Medicine

## 2015-05-06 ENCOUNTER — Other Ambulatory Visit: Payer: Self-pay | Admitting: Internal Medicine

## 2015-05-06 ENCOUNTER — Encounter: Payer: Self-pay | Admitting: Internal Medicine

## 2015-05-06 ENCOUNTER — Ambulatory Visit (INDEPENDENT_AMBULATORY_CARE_PROVIDER_SITE_OTHER): Payer: Commercial Managed Care - HMO | Admitting: Internal Medicine

## 2015-05-06 VITALS — BP 132/84 | HR 60 | Temp 97.9°F | Resp 16 | Ht 71.0 in | Wt 167.8 lb

## 2015-05-06 DIAGNOSIS — Z9181 History of falling: Secondary | ICD-10-CM

## 2015-05-06 DIAGNOSIS — K219 Gastro-esophageal reflux disease without esophagitis: Secondary | ICD-10-CM

## 2015-05-06 DIAGNOSIS — Z0000 Encounter for general adult medical examination without abnormal findings: Secondary | ICD-10-CM

## 2015-05-06 DIAGNOSIS — Z1212 Encounter for screening for malignant neoplasm of rectum: Secondary | ICD-10-CM

## 2015-05-06 DIAGNOSIS — I10 Essential (primary) hypertension: Secondary | ICD-10-CM

## 2015-05-06 DIAGNOSIS — E559 Vitamin D deficiency, unspecified: Secondary | ICD-10-CM

## 2015-05-06 DIAGNOSIS — E785 Hyperlipidemia, unspecified: Secondary | ICD-10-CM

## 2015-05-06 DIAGNOSIS — Z1331 Encounter for screening for depression: Secondary | ICD-10-CM

## 2015-05-06 DIAGNOSIS — Z79899 Other long term (current) drug therapy: Secondary | ICD-10-CM

## 2015-05-06 DIAGNOSIS — N182 Chronic kidney disease, stage 2 (mild): Secondary | ICD-10-CM

## 2015-05-06 DIAGNOSIS — Z6823 Body mass index (BMI) 23.0-23.9, adult: Secondary | ICD-10-CM

## 2015-05-06 DIAGNOSIS — R972 Elevated prostate specific antigen [PSA]: Secondary | ICD-10-CM

## 2015-05-06 DIAGNOSIS — Z125 Encounter for screening for malignant neoplasm of prostate: Secondary | ICD-10-CM

## 2015-05-06 DIAGNOSIS — R7303 Prediabetes: Secondary | ICD-10-CM

## 2015-05-06 LAB — CBC WITH DIFFERENTIAL/PLATELET
BASOS ABS: 0 10*3/uL (ref 0.0–0.1)
Basophils Relative: 0 % (ref 0–1)
Eosinophils Absolute: 0.3 10*3/uL (ref 0.0–0.7)
Eosinophils Relative: 4 % (ref 0–5)
HCT: 44.2 % (ref 39.0–52.0)
Hemoglobin: 15 g/dL (ref 13.0–17.0)
Lymphocytes Relative: 34 % (ref 12–46)
Lymphs Abs: 2.1 10*3/uL (ref 0.7–4.0)
MCH: 29.8 pg (ref 26.0–34.0)
MCHC: 33.9 g/dL (ref 30.0–36.0)
MCV: 87.7 fL (ref 78.0–100.0)
MONO ABS: 0.5 10*3/uL (ref 0.1–1.0)
MPV: 10 fL (ref 8.6–12.4)
Monocytes Relative: 8 % (ref 3–12)
NEUTROS ABS: 3.4 10*3/uL (ref 1.7–7.7)
NEUTROS PCT: 54 % (ref 43–77)
PLATELETS: 161 10*3/uL (ref 150–400)
RBC: 5.04 MIL/uL (ref 4.22–5.81)
RDW: 14.3 % (ref 11.5–15.5)
WBC: 6.3 10*3/uL (ref 4.0–10.5)

## 2015-05-06 LAB — TSH: TSH: 1.624 u[IU]/mL (ref 0.350–4.500)

## 2015-05-06 LAB — HEMOGLOBIN A1C
Hgb A1c MFr Bld: 6 % — ABNORMAL HIGH (ref <5.7)
Mean Plasma Glucose: 126 mg/dL — ABNORMAL HIGH (ref <117)

## 2015-05-06 MED ORDER — RANITIDINE HCL 150 MG PO TABS: 150.0000 mg | ORAL_TABLET | Freq: Two times a day (BID) | ORAL | Status: DC

## 2015-05-06 NOTE — Patient Instructions (Signed)

## 2015-05-06 NOTE — Progress Notes (Signed)
Patient ID: Keith [name], male   DOB: 1932-01-31, 81 y.o.   MRN: [id number]    Comprehensive Examination  This very nice 81 y.o. MWM presents for complete physical.  Patient has been followed for labile HTN, ASCAD s/p PTCA, Prediabetes, Hyperlipidemia, and Vitamin D Deficiency.   Patient has labile HTN monitored expectantly for many years. In 1991 he had a PTCA. In 2008 he had a negative Cardiolite.  Patient's BP has been controlled at home. Today's BP: 132/84 mmHg. Patient denies any cardiac symptoms as chest pain, palpitations, shortness of breath, dizziness or ankle swelling.   Patient's hyperlipidemia is controlled with diet and medications. Patient denies myalgias or other medication SE's. Last lipids were at goal -  Cholesterol 142; HDL 48; LDL 73; Triglycerides 105 on  12/23/2014.   Patient has prediabetes since Nov 2013 with A1c of 5.7%  and patient denies reactive hypoglycemic symptoms, visual blurring, diabetic polys or paresthesias. Last A1c was 5.6% on 12/23/2014.   Patient has GERD controlled with diet and takes #1 ranitidine daily and occasionally takes a 2sd dose. Finally, patient has history of Vitamin D Deficiency of 27 in 2009 and last vitamin D was 44 on 12/23/2014  Medication Sig  . aspirin 81 MG tablet Take 81 mg by mouth daily.  Marland Kitchen atorvastatin  40 MG tablet Take 1 tablet (40 mg total) by mouth daily.  Marland Kitchen VITAMIN D  Take 2,000 Int'l Units by mouth daily.  . Magnesium 250 MG TABS Take 1 tablet by mouth.  Marland Kitchen FISH OIL Take by mouth daily.  . ranitidine 150 MG tablet Take 1 tablet (150 mg total) by mouth 2 (two) times daily. Takes daily  . Albuterol  (PROAIR )  Inhale 2 puffs into the lungs every 6 (six) hours as needed.  . triamcinolone oint (KENALOG) 0.1 %    Allergies  Allergen Reactions  . Penicillins    Past Medical History  Diagnosis Date  . Hypertension   . Hyperlipidemia   . GERD (gastroesophageal reflux disease)   . Vitamin D deficiency   . Elevated PSA     Health Maintenance  Topic Date Due  . INFLUENZA VACCINE  05/09/2015  . PNA vac Low Risk Adult (2 of 2 - PPSV23) 06/16/2015  . COLONOSCOPY  04/26/2019  . TETANUS/TDAP  02/10/2020  . ZOSTAVAX  Completed   Immunization History  Administered Date(s) Administered  . Influenza Split 07/29/2013  . Influenza, High Dose Seasonal PF 06/15/2014  . Pneumococcal Conjugate-13 06/15/2014  . Td 02/09/2010  . Zoster 10/08/2000   Past Surgical History  Procedure Laterality Date  . Hemorrhoid surgery  2011   Family History  Problem Relation Age of Onset  . Heart disease Mother   . Heart disease Father    History   Social History  . Marital Status: Married    Spouse Name: N/A  . Number of Children: N/A  . Years of Education: N/A   Occupational History  . Retired 2013.    Social History Main Topics  . Smoking status: Former Smoker    Quit date: 10/04/1965  . Smokeless tobacco: Not on file  . Alcohol Use: 0.0 oz/week    0 Standard drinks or equivalent per week     Comment: rarely  . Drug Use: No  . Sexual Activity: No    ROS Constitutional: Denies fever, chills, weight loss/gain, headaches, insomnia,  night sweats or change in appetite. Does c/o fatigue. Eyes: Denies redness, blurred vision, diplopia, discharge, itchy or  watery eyes.  ENT: Denies discharge, congestion, post nasal drip, epistaxis, sore throat, earache, hearing loss, dental pain, Tinnitus, Vertigo, Sinus pain or snoring.  Cardio: Denies chest pain, palpitations, irregular heartbeat, syncope, dyspnea, diaphoresis, orthopnea, PND, claudication or edema Respiratory: denies cough, dyspnea, DOE, pleurisy, hoarseness, laryngitis or wheezing.  Gastrointestinal: Denies dysphagia, heartburn, reflux, water brash, pain, cramps, nausea, vomiting, bloating, diarrhea, constipation, hematemesis, melena, hematochezia, jaundice or hemorrhoids Genitourinary: Denies dysuria, frequency, urgency, nocturia, hesitancy, discharge, hematuria  or flank pain Musculoskeletal: Denies arthralgia, myalgia, stiffness, Jt. Swelling, pain, limp or strain/sprain. Denies Falls. Skin: Denies puritis, rash, hives, warts, acne, eczema or change in skin lesion Neuro: No weakness, tremor, incoordination, spasms, paresthesia or pain Psychiatric: Denies confusion, memory loss or sensory loss. Denies Depression. Endocrine: Denies change in weight, skin, hair change, nocturia, and paresthesia, diabetic polys, visual blurring or hyper / hypo glycemic episodes.  Heme/Lymph: No excessive bleeding, bruising or enlarged lymph nodes.  Physical Exam  BP 132/84   Pulse 60  Temp 97.9 F   Resp 16  Ht '5\' 11"'$   Wt 167 lb 12.8 oz     BMI 23.41   General Appearance: Well nourished, in no apparent distress. Eyes: PERRLA, EOMs, conjunctiva no swelling or erythema, normal fundi and vessels. Sinuses: No frontal/maxillary tenderness ENT/Mouth: EACs patent / TMs  nl. Nares clear without erythema, swelling, mucoid exudates. Oral hygiene is good. No erythema, swelling, or exudate. Tongue normal, non-obstructing. Tonsils not swollen or erythematous. Hearing normal.  Neck: Supple, thyroid normal. No bruits, nodes or JVD. Respiratory: Respiratory effort normal.  BS equal and clear bilateral without rales, rhonci, wheezing or stridor. Cardio: Heart sounds are normal with regular rate and rhythm and no murmurs, rubs or gallops. Peripheral pulses are normal and equal bilaterally without edema. No aortic or femoral bruits. Chest: symmetric with normal excursions and percussion.  Abdomen: Flat, soft, with bowel sounds. Nontender, no guarding, rebound, hernias, masses, or organomegaly.  Lymphatics: Non tender without lymphadenopathy.  Genitourinary: Deferred for age. Musculoskeletal: Full ROM all peripheral extremities, joint stability, 5/5 strength, and normal gait. Skin: Warm and dry without rashes, lesions, cyanosis, clubbing or  ecchymosis.  Neuro: Cranial nerves  intact, reflexes equal bilaterally. Normal muscle tone, no cerebellar symptoms. Sensation intact.  Pysch: Awake and oriented X 3 with normal affect, insight and judgment appropriate.   Assessment and Plan  1. Essential hypertension  - Microalbumin / creatinine urine ratio - EKG 12-Lead - [place], RETROPERITNL ABD,  LTD - TSH  2. Hyperlipidemia  - Lipid panel  3. Prediabetes  - Hemoglobin A1c - Insulin, random  4. Vitamin D deficiency  - Vit D  25 hydroxy   5. Gastroesophageal reflux disease   6. Elevated PSA   7. Chronic kidney disease, stage 2    8. Screening for rectal cancer  - POC Hemoccult Bld/Stl   9. Prostate cancer screening  - PSA  10. Depression screen  - Screen Negative  11. Medication management  - Urine Microscopic - CBC with Differential/Platelet - BASIC METABOLIC PANEL WITH GFR - Hepatic function panel - Magnesium   Continue prudent diet as discussed, weight control, BP monitoring, regular exercise, and medications as discussed.  Discussed med effects and SE's. Routine screening labs and tests as requested with regular follow-up as recommended.  Over 40 minutes of exam, counseling &  chart review was performed

## 2015-05-07 LAB — URINALYSIS, MICROSCOPIC ONLY
BACTERIA UA: NONE SEEN [HPF]
Casts: NONE SEEN [LPF]
Crystals: NONE SEEN [HPF]
RBC / HPF: NONE SEEN RBC/HPF (ref <=2)
Squamous Epithelial / LPF: NONE SEEN [HPF] (ref <=5)
WBC UA: NONE SEEN WBC/HPF (ref <=5)
YEAST: NONE SEEN [HPF]

## 2015-05-07 LAB — HEPATIC FUNCTION PANEL
ALBUMIN: 3.9 g/dL (ref 3.6–5.1)
ALK PHOS: 64 U/L (ref 40–115)
ALT: 16 U/L (ref 9–46)
AST: 23 U/L (ref 10–35)
BILIRUBIN DIRECT: 0.2 mg/dL (ref <=0.2)
Indirect Bilirubin: 1 mg/dL (ref 0.2–1.2)
Total Bilirubin: 1.2 mg/dL (ref 0.2–1.2)
Total Protein: 6.3 g/dL (ref 6.1–8.1)

## 2015-05-07 LAB — MAGNESIUM: MAGNESIUM: 1.8 mg/dL (ref 1.5–2.5)

## 2015-05-07 LAB — BASIC METABOLIC PANEL WITH GFR
BUN: 15 mg/dL (ref 7–25)
CHLORIDE: 105 mmol/L (ref 98–110)
CO2: 28 mmol/L (ref 20–31)
Calcium: 9.1 mg/dL (ref 8.6–10.3)
Creat: 0.76 mg/dL (ref 0.70–1.11)
GFR, Est African American: >89 mL/min (ref >=60)
GFR, Est Non African American: 86 mL/min (ref >=60)
Glucose, Bld: 98 mg/dL (ref 65–99)
POTASSIUM: 4.4 mmol/L (ref 3.5–5.3)
Sodium: 140 mmol/L (ref 135–146)

## 2015-05-07 LAB — PSA: PSA: 3.74 ng/mL (ref <=4.00)

## 2015-05-07 LAB — LIPID PANEL
CHOL/HDL RATIO: 3.2 ratio (ref <=5.0)
CHOLESTEROL: 142 mg/dL (ref 125–200)
HDL: 45 mg/dL (ref >=40)
LDL CALC: 74 mg/dL (ref <130)
Triglycerides: 116 mg/dL (ref <150)
VLDL: 23 mg/dL (ref <30)

## 2015-05-07 LAB — INSULIN, RANDOM: Insulin: 31.6 u[IU]/mL — ABNORMAL HIGH (ref 2.0–19.6)

## 2015-05-07 LAB — MICROALBUMIN / CREATININE URINE RATIO
Creatinine, Urine: 102.9 mg/dL
MICROALB UR: 0.6 mg/dL (ref <2.0)
MICROALB/CREAT RATIO: 5.8 mg/g (ref 0.0–30.0)

## 2015-05-07 LAB — VITAMIN D 25 HYDROXY (VIT D DEFICIENCY, FRACTURES): Vit D, 25-Hydroxy: 51 ng/mL (ref 30–100)

## 2015-05-16 ENCOUNTER — Other Ambulatory Visit: Payer: Self-pay | Admitting: Internal Medicine

## 2015-05-24 LAB — COLOGUARD

## 2015-05-25 ENCOUNTER — Other Ambulatory Visit: Payer: Self-pay

## 2015-05-25 DIAGNOSIS — Z1211 Encounter for screening for malignant neoplasm of colon: Secondary | ICD-10-CM

## 2015-05-26 ENCOUNTER — Encounter: Payer: Self-pay | Admitting: Internal Medicine

## 2015-06-24 ENCOUNTER — Other Ambulatory Visit: Payer: Self-pay | Admitting: Gastroenterology

## 2015-06-24 NOTE — Addendum Note (Signed)
Addended by: Arta Silence on: 06/24/2015 04:51 PM   Modules accepted: Orders

## 2015-06-28 ENCOUNTER — Other Ambulatory Visit: Payer: Self-pay | Admitting: Gastroenterology

## 2015-06-28 NOTE — Addendum Note (Signed)
Addended by: Arta Silence on: 06/28/2015 01:59 PM   Modules accepted: Orders

## 2015-06-29 ENCOUNTER — Encounter (HOSPITAL_COMMUNITY): Payer: Self-pay | Admitting: *Deleted

## 2015-06-29 ENCOUNTER — Encounter (HOSPITAL_COMMUNITY)
Admission: RE | Disposition: A | Discharge status: Home / Self Care | Payer: Self-pay | Source: Ambulatory Visit | Attending: Gastroenterology

## 2015-06-29 ENCOUNTER — Ambulatory Visit (HOSPITAL_COMMUNITY)
Admission: RE | Admit: 2015-06-29 | Discharge: 2015-06-29 | Disposition: A | Discharge status: Home / Self Care | Payer: Commercial Managed Care - HMO | Source: Ambulatory Visit | Attending: Gastroenterology | Admitting: Gastroenterology

## 2015-06-29 DIAGNOSIS — I129 Hypertensive chronic kidney disease with stage 1 through stage 4 chronic kidney disease, or unspecified chronic kidney disease: Secondary | ICD-10-CM | POA: Diagnosis not present

## 2015-06-29 DIAGNOSIS — Z87891 Personal history of nicotine dependence: Secondary | ICD-10-CM | POA: Diagnosis not present

## 2015-06-29 DIAGNOSIS — Z79899 Other long term (current) drug therapy: Secondary | ICD-10-CM | POA: Insufficient documentation

## 2015-06-29 DIAGNOSIS — K644 Residual hemorrhoidal skin tags: Secondary | ICD-10-CM | POA: Diagnosis not present

## 2015-06-29 DIAGNOSIS — R1900 Intra-abdominal and pelvic swelling, mass and lump, unspecified site: Secondary | ICD-10-CM | POA: Diagnosis not present

## 2015-06-29 DIAGNOSIS — K219 Gastro-esophageal reflux disease without esophagitis: Secondary | ICD-10-CM | POA: Insufficient documentation

## 2015-06-29 DIAGNOSIS — R195 Other fecal abnormalities: Secondary | ICD-10-CM | POA: Diagnosis present

## 2015-06-29 DIAGNOSIS — Z7982 Long term (current) use of aspirin: Secondary | ICD-10-CM | POA: Insufficient documentation

## 2015-06-29 DIAGNOSIS — E785 Hyperlipidemia, unspecified: Secondary | ICD-10-CM | POA: Diagnosis not present

## 2015-06-29 DIAGNOSIS — I2510 Atherosclerotic heart disease of native coronary artery without angina pectoris: Secondary | ICD-10-CM | POA: Insufficient documentation

## 2015-06-29 DIAGNOSIS — N183 Chronic kidney disease, stage 3 (moderate): Secondary | ICD-10-CM | POA: Insufficient documentation

## 2015-06-29 HISTORY — PX: EUS: SHX5427

## 2015-06-29 SURGERY — ULTRASOUND, LOWER GI TRACT, ENDOSCOPIC
Anesthesia: Moderate Sedation

## 2015-06-29 MED ORDER — MIDAZOLAM HCL 10 MG/2ML IJ SOLN
INTRAMUSCULAR | Status: DC | PRN
  Administered 2015-06-29: 2 mg via INTRAVENOUS
  Administered 2015-06-29: 1 mg via INTRAVENOUS
  Administered 2015-06-29: 2 mg via INTRAVENOUS

## 2015-06-29 MED ORDER — FENTANYL CITRATE (PF) 100 MCG/2ML IJ SOLN
INTRAMUSCULAR | Status: AC
  Filled 2015-06-29: qty 2

## 2015-06-29 MED ORDER — MIDAZOLAM HCL 5 MG/ML IJ SOLN
INTRAMUSCULAR | Status: AC
  Filled 2015-06-29: qty 2

## 2015-06-29 MED ORDER — FENTANYL CITRATE (PF) 100 MCG/2ML IJ SOLN
INTRAMUSCULAR | Status: DC | PRN
  Administered 2015-06-29 (×2): 25 ug via INTRAVENOUS

## 2015-06-29 MED ORDER — SODIUM CHLORIDE 0.9 % IV SOLN
INTRAVENOUS | Status: DC
  Administered 2015-06-29: 500 mL via INTRAVENOUS

## 2015-06-29 MED ORDER — SODIUM CHLORIDE 0.9 % IV SOLN
INTRAVENOUS | Status: DC
Start: 2015-06-29 — End: 2015-06-29

## 2015-06-29 MED ORDER — DIPHENHYDRAMINE HCL 50 MG/ML IJ SOLN
INTRAMUSCULAR | Status: AC
  Filled 2015-06-29: qty 1

## 2015-06-29 NOTE — H&P (Signed)
Patient interval history reviewed.  Patient examined again.  There has been no change from documented H/P dated 06/15/15 (scanned into chart from our office) except as documented above.  Assessment:  1.  Rectal cancer.  Plan:  1.  Endorectal ultrasound. 2.  Risks (bleeding, infection, bowel perforation that could require surgery, sedation-related changes in cardiopulmonary systems), benefits (identification and possible treatment of source of symptoms, exclusion of certain causes of symptoms), and alternatives (watchful waiting, radiographic imaging studies, empiric medical treatment) of endorectal ultrasound (RUS) were explained to patient/family in detail and patient wishes to proceed.

## 2015-06-29 NOTE — Discharge Instructions (Signed)
Endorectal ultrasound (RUS)  Post procedure instructions:  Read the instructions outlined below and refer to this sheet in the next few weeks. These discharge instructions provide you with general information on caring for yourself after you leave the hospital. Your doctor may also give you specific instructions. While your treatment has been planned according to the most current medical practices available, unavoidable complications occasionally occur. If you have any problems or questions after discharge, call Dr. Paulita Fujita at [facility] [phone]).  HOME CARE INSTRUCTIONS  ACTIVITY:  You may resume your regular activity, but move at a slower pace for the next 24 hours.   Take frequent rest periods for the next 24 hours.   Walking will help get rid of the air and reduce the bloated feeling in your belly (abdomen).   No driving for 24 hours (because of the medicine (anesthesia) used during the test).   You may shower.   Do not sign any important legal documents or operate any machinery for 24 hours (because of the anesthesia used during the test).  NUTRITION:  Drink plenty of fluids.   You may resume your normal diet as instructed by your doctor.   Begin with a light meal and progress to your normal diet. Heavy or fried foods are harder to digest and may make you feel sick to your stomach (nauseated).   Avoid alcoholic beverages for 24 hours or as instructed.  MEDICATIONS:  You may resume your normal medications unless your doctor tells you otherwise.  WHAT TO EXPECT TODAY:  Some feelings of bloating in the abdomen.   Passage of more gas than usual.   Spotting of blood in your stool or on the toilet paper.  IF YOU HAD POLYPS REMOVED DURING THE COLONOSCOPY:  No aspirin products for 7 days or as instructed.   No alcohol for 7 days or as instructed.   Eat a soft diet for the next 24 hours.   FINDING OUT THE RESULTS OF YOUR TEST  Not all test results are  available during your visit. If your test results are not back during the visit, make an appointment with your caregiver to find out the results. Do not assume everything is normal if you have not heard from your caregiver or the medical facility. It is important for you to follow up on all of your test results.     SEEK IMMEDIATE MEDICAL CARE IF:   You have more than a spotting of blood in your stool.   Your belly is swollen (abdominal distention).   You are nauseated or vomiting.   You have a fever.   You have abdominal pain or discomfort that is severe or gets worse throughout the day.    Document Released: 05/08/2004 Document Revised: 06/06/2011 Document Reviewed: 05/06/2008 [facility] Patient Information 2012 [place].

## 2015-06-29 NOTE — Op Note (Signed)
[facility] [place], [address]   OPERATIVE PROCEDURE REPORT  PATIENT: Keith [name], Keith [name]  MR#: [id number] BIRTHDATE: 03/16/1932  GENDER: male ENDOSCOPIST: Arta Silence, MD REFERRED BY:  Harlan Stains, M.D. PROCEDURE DATE:  06/29/2015 PROCEDURE:   Flexible sigmoidoscopy with endorectal ultrasound (RUS)  ASA CLASS:   Class III INDICATIONS:1.  rectosigmoid cancer (adenocarcinoma within polyp). MEDICATIONS: Fentanyl 50 mcg IV and Versed 5 mg IV  DESCRIPTION OF PROCEDURE:   After the risks benefits and alternatives of the procedure were thoroughly explained, informed consent was obtained.  Throughout the procedure, the patients blood pressure, pulse and oxygen saturations were monitored continuously. Under direct visualization, the radial forward-viewing echoendoscope was introduced through the anus  and advanced to the sigmoid colon .  Water was used as necessary to provide an acoustic interface.  Imaging was obtained at 7.5 and 12Mhz. Upon completion of the imaging, water was removed and the patient was sent to the recovery room in satisfactory condition. Estimated blood loss is zero unless otherwise noted in this procedure report.    FINDINGS:   External hemorrhoids, otherwise normal digital rectal examination; no palpable mass.  Polypectomy scar noted for distal rectal polyp without residual polyp tissue identified.  Ulcerated mass seen near 20cm from the anal verge, consistent with partial prior polypectomy site, with neighboring tattoo markings noted. After instilling water within the colonic lumen to facilitate acoustic coupling, the lesion was visualized via endorectal ultrasound.  Due to angulation, I was unable to traverse the entire lesion, but was able to visualize the majority of the lesion under ultrasound.  Visualized portion of the lesion appears to be confined to the muscularis propria.  No perilesional adenopathy was seen.   Please note that cautery artifact can distort tissue layers, and makes both the sensitivity and specificity of endorectal ultrasound in this setting less accurate.  STAGING:  T2 N0 Mx via endorectal ultrasound  ENDOSCOPIC IMPRESSION: As above.  RECOMMENDATIONS: 1.  Watch for potential complications of procedure. 2.  Arrange PET-CT. 3.  Outpatient consultations (oncology, surgery, radiation oncology) will be pending PET-CT findings. 4.  Will discuss with Dr. Amedeo Plenty.   _______________________________ Lorrin MaisArta Silence, MD 06/29/2015 12:26 PM   CC:

## 2015-07-01 ENCOUNTER — Other Ambulatory Visit (HOSPITAL_COMMUNITY): Payer: Self-pay | Admitting: Gastroenterology

## 2015-07-01 ENCOUNTER — Encounter (HOSPITAL_COMMUNITY): Payer: Self-pay | Admitting: Gastroenterology

## 2015-07-01 DIAGNOSIS — C20 Malignant neoplasm of rectum: Secondary | ICD-10-CM

## 2015-07-05 ENCOUNTER — Other Ambulatory Visit: Payer: Self-pay | Admitting: Internal Medicine

## 2015-07-05 ENCOUNTER — Ambulatory Visit (HOSPITAL_COMMUNITY)
Admission: RE | Admit: 2015-07-05 | Discharge: 2015-07-05 | Disposition: A | Discharge status: Home / Self Care | Payer: Commercial Managed Care - HMO | Source: Ambulatory Visit | Attending: Gastroenterology | Admitting: Gastroenterology

## 2015-07-05 DIAGNOSIS — K769 Liver disease, unspecified: Secondary | ICD-10-CM | POA: Diagnosis not present

## 2015-07-05 DIAGNOSIS — C20 Malignant neoplasm of rectum: Secondary | ICD-10-CM

## 2015-07-05 LAB — GLUCOSE, CAPILLARY: GLUCOSE-CAPILLARY: 93 mg/dL (ref 65–99)

## 2015-07-05 MED ORDER — FLUDEOXYGLUCOSE F - 18 (FDG) INJECTION
8.8000 | Freq: Once | INTRAVENOUS | Status: DC | PRN
  Administered 2015-07-05: 8.8 via INTRAVENOUS
  Filled 2015-07-05: qty 8.8

## 2015-07-11 ENCOUNTER — Telehealth: Payer: Self-pay | Admitting: Oncology

## 2015-07-11 ENCOUNTER — Other Ambulatory Visit: Payer: Self-pay | Admitting: General Surgery

## 2015-07-11 MED ORDER — DEXTROSE 5 % IV SOLN
900.0000 mg | INTRAVENOUS | Status: AC
  Administered 2015-08-02: 900 mg via INTRAVENOUS

## 2015-07-11 MED ORDER — GENTAMICIN SULFATE 40 MG/ML IJ SOLN
5.0000 mg/kg | INTRAVENOUS | Status: AC
  Administered 2015-08-02: 380 mg via INTRAVENOUS

## 2015-07-11 NOTE — Telephone Encounter (Signed)
New patient appt-s/w patient wife and gave np appt for 10/07 @ 8:15 w/Dr. Benay Spice

## 2015-07-13 ENCOUNTER — Telehealth: Payer: Self-pay | Admitting: *Deleted

## 2015-07-13 NOTE — Telephone Encounter (Signed)
After discussion in GI Conference today, was determined that appointment with radiation oncology not indicated. Appointment cancelled and left VM at patient's home with this information and that arrival does not need to occur until 10:15 for 10:30 with Dr. Benay Spice. Requested he call back to confirm.

## 2015-07-15 ENCOUNTER — Encounter: Payer: Self-pay | Admitting: *Deleted

## 2015-07-15 ENCOUNTER — Ambulatory Visit (HOSPITAL_BASED_OUTPATIENT_CLINIC_OR_DEPARTMENT_OTHER): Payer: Commercial Managed Care - HMO | Admitting: Oncology

## 2015-07-15 ENCOUNTER — Ambulatory Visit: Payer: Commercial Managed Care - HMO | Admitting: Nutrition

## 2015-07-15 ENCOUNTER — Ambulatory Visit: Payer: Commercial Managed Care - HMO | Admitting: Physical Therapy

## 2015-07-15 ENCOUNTER — Ambulatory Visit (HOSPITAL_BASED_OUTPATIENT_CLINIC_OR_DEPARTMENT_OTHER): Payer: Commercial Managed Care - HMO

## 2015-07-15 ENCOUNTER — Ambulatory Visit: Payer: Commercial Managed Care - HMO | Admitting: Oncology

## 2015-07-15 ENCOUNTER — Ambulatory Visit: Payer: Commercial Managed Care - HMO | Admitting: Radiation Oncology

## 2015-07-15 ENCOUNTER — Encounter: Payer: Self-pay | Admitting: Oncology

## 2015-07-15 ENCOUNTER — Telehealth: Payer: Self-pay | Admitting: Oncology

## 2015-07-15 VITALS — BP 142/62 | HR 58 | Temp 97.6°F | Resp 18 | Ht 71.0 in | Wt 165.2 lb

## 2015-07-15 DIAGNOSIS — C19 Malignant neoplasm of rectosigmoid junction: Secondary | ICD-10-CM | POA: Diagnosis not present

## 2015-07-15 DIAGNOSIS — C187 Malignant neoplasm of sigmoid colon: Secondary | ICD-10-CM

## 2015-07-15 DIAGNOSIS — I2510 Atherosclerotic heart disease of native coronary artery without angina pectoris: Secondary | ICD-10-CM | POA: Diagnosis not present

## 2015-07-15 DIAGNOSIS — C189 Malignant neoplasm of colon, unspecified: Secondary | ICD-10-CM | POA: Insufficient documentation

## 2015-07-15 DIAGNOSIS — C20 Malignant neoplasm of rectum: Secondary | ICD-10-CM | POA: Diagnosis present

## 2015-07-15 DIAGNOSIS — Z87891 Personal history of nicotine dependence: Secondary | ICD-10-CM

## 2015-07-15 HISTORY — DX: Malignant neoplasm of colon, unspecified: C18.9

## 2015-07-15 NOTE — Therapy (Signed)
[facility] [address] [place], [place], [address] Phone: [phone]   Fax:  [phone]  Physical Therapy Evaluation  Patient Details  Name: Keith [name] MRN: [id number] Date of Birth: [birth year] Referring Provider:  Ladell Pier, MD  Encounter Date: 07/15/2015    Past Medical History  Diagnosis Date  . Hypertension   . Hyperlipidemia   . GERD (gastroesophageal reflux disease)   . Vitamin D deficiency   . Elevated PSA     Past Surgical History  Procedure Laterality Date  . Hemorrhoid surgery  2011  . Eus N/A 06/29/2015    Procedure: LOWER ENDOSCOPIC ULTRASOUND (EUS);  Surgeon: Arta Silence, MD;  Location: [facility];  Service: Endoscopy;  Laterality: N/A;    There were no vitals filed for this visit.  Visit Diagnosis:  Rectal cancer [facility]) - Plan: PT plan of care cert/re-cert      Subjective Assessment - 07/15/15 1154    Subjective Patient is attending GI clinic due to bowel changes resulting in rectal cancer   Patient Stated Goals understand ways to manage symptoms from treatment   Currently in Pain? No/denies            [facility] PT Assessment - 07/15/15 0001    Assessment   Medical Diagnosis Rectal cancer   Onset Date/Surgical Date 06/21/15   Prior Therapy None   Precautions   Precautions Other (comment)  NO ultrasound   Balance Screen   Has the patient fallen in the past 6 months No   Has the patient had a decrease in activity level because of a fear of falling?  No   Is the patient reluctant to leave their home because of a fear of falling?  No   Prior Function   Level of Independence Independent   Cognition   Overall Cognitive Status Within Functional Limits for tasks assessed   Observation/Other Assessments   Focus on Therapeutic Outcomes (FOTO)  therapist discretion 10% limitation due to having to go through cancer treatment   ROM / Strength   AROM / PROM / Strength  AROM;Strength   AROM   Overall AROM Comments lumbar ROM is full   Strength   Overall Strength Comments Bil. hip strength is 5/5                           PT Education - 07/15/15 1121    Education provided Yes   Education Details toileting technique, energy conservation,  pelvic floor exercise,    Person(s) Educated Patient   Methods Explanation;Demonstration;Verbal cues;Handout   Comprehension Verbalized understanding;Returned demonstration             PT Long Term Goals - 07/15/15 1122    PT LONG TERM GOAL #1   Title educated on toileting technique to make it easier to have a bowel movement   Time 1   Period Days   Status Achieved   PT LONG TERM GOAL #2   Title education on pelviic floor exercises to increase bowel control for after surgery   Time 1   Period Days   Status Achieved   PT LONG TERM GOAL #3   Title education on conservation of energy   Time 1   Period Days   Status Achieved   PT LONG TERM GOAL #4   Title --   Time 1   Period --   Status --  Plan - 24-Jul-2015 1124    Clinical Impression Statement Patient is a 81 year old male with diagnosis of rectal cancer.  Patient is attending GI clinic.  Patient will be having treatment for the rectal cancer.  Patient will benefit from physical therapy on ways to manage the effects of treatment.    Pt will benefit from skilled therapeutic intervention in order to improve on the following deficits Decreased activity tolerance   Rehab Potential Excellent   Clinical Impairments Affecting Rehab Potential None   PT Frequency 1x / week   PT Duration --  1 time in GI clinic   PT Treatment/Interventions ADLs/Self Care Home Management;Therapeutic exercise;Therapeutic activities;Patient/family education   PT Next Visit Plan 1 time visit in clinic to be educated on ways to manage effects of rectal cancer treatment   PT Home Exercise Plan current HEP   Recommended Other Services None    Consulted and Agree with Plan of Care Patient          G-Codes - 2015/07/24 1119    Functional Assessment Tool Used therapist discretion 10% limitation due to treatment for cancer   Functional Limitation Other PT primary   Other PT Primary Current Status (Q9476) At least 1 percent but less than 20 percent impaired, limited or restricted   Other PT Primary Goal Status (L4650) At least 1 percent but less than 20 percent impaired, limited or restricted   Other PT Primary Discharge Status (P5465) At least 1 percent but less than 20 percent impaired, limited or restricted       Problem List Patient Active Problem List   Diagnosis Date Noted  . Medication management 03/02/2014  . Prediabetes 09/14/2013  . Hypertension   . Chronic kidney disease, Stage 2 (GFR 82 ml/min)   . Hyperlipidemia   . GERD (gastroesophageal reflux disease)   . Vitamin D deficiency   . Elevated PSA     GRAY,CHERYL,PT 2015/07/24, 11:56 AM  [facility] [address] [place], [place], [address] Phone: [phone]   Fax:  [phone]

## 2015-07-15 NOTE — Progress Notes (Signed)
[facility] New Patient Consult   Referring NI:OEVOJ Keith [name] 81 y.o.  July 06, 1932    Reason for Referral: Colon cancer   HPI: He reports a 6-8 month history of rectal urgency when walking. He saw his primary physician was noted to have a Hemoccult-positive stool. He was referred to Dr. Amedeo Plenty and underwent a colonoscopy on 06/21/2015. A 5 mm polyp was removed from the proximal ascending colon. A six-month meter polyp was removed from the sigmoid colon. A mass was noted at the rectosigmoid colon at 20 cm proximal to the anus. The mass was incompletely removed. The area was tattooed. An 8 mm polyp was removed at 6 cm proximal to the anus. The pathology [id number]) revealed adenocarcinoma involving the rectosigmoid polyp. The polyp at the rectum returned as a tubulovillous number and the ascending colon polyp had no high-grade dysplasia or malignancy. He was referred for a staging PET scan on 07/05/2015. A segment of hypermetabolic thickening was noted at the proximal rectum over a 4 cm segment. No hypermetabolic lymph nodes in the perirectal fat. An 8 mm low-density lesion was noted in the inferior right liver without associated metabolic activity. No evidence of metastatic disease. He was referred to Dr. Paulita Fujita for endoscopic ultrasound on 06/29/2015. An ulcerated mass was noted at 20 cm from the anal verge with a neighboring tattoo mark. The lesion appeared to be confined to the muscular propria with no surrounding adenopathy. The lesion was staged as a T2N0 tumor by ultrasound.  He saw Dr. Barry Dienes and is being scheduled for surgery.  Past Medical History  Diagnosis Date  . Hypertension   . Hyperlipidemia   . GERD (gastroesophageal reflux disease)   . Vitamin D deficiency   . Elevated PSA     .  Coronary artery disease-PTCA at age 64  Past Surgical History  Procedure Laterality Date  . Hemorrhoid surgery  2011  . Eus N/A 06/29/2015    Procedure: LOWER  ENDOSCOPIC ULTRASOUND (EUS);  Surgeon: Arta Silence, MD;  Location: [facility];  Service: Endoscopy;  Laterality: N/A;   .    Inguinal hernia repair  Medications: Reviewed  Allergies:  Allergies  Allergen Reactions  . Penicillins     Family history: His daughter was diagnosed with thyroid cancer in her 93s. No other family history of cancer.  Social History:   He lives in [place]. 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 He quit smoking cigarettes at age 24. No alcohol use. No risk factor for HIV or hepatitis. He was in the [employer] from [place] until 1959.     ROS:   Positives include: Fecal urgency when walking, "cold "in February 2016 with a persistent sore throat (he reports being evaluated by ENT), chronic "arthritis "pain in the back, rash over the back for the past year  A complete ROS was otherwise negative.  Physical Exam:  Blood pressure 142/62, pulse 58, temperature 97.6 F (36.4 C), temperature source Oral, resp. rate 18, height _0  (1.803 m), weight 165 lb 3.2 oz (74.934 kg), SpO2 98 %.  HEENT: Oropharynx without visible mass, neck without mass Lungs: Clear bilaterally Cardiac: Regular rate and rhythm Abdomen: No hepatosplenomegaly, nontender, no mass GU: Testes without mass  Vascular: No leg edema Lymph nodes: No cervical, supraclavicular, axillary, or inguinal nodes Neurologic: The motor exam appears intact in the upper and lower extremities Skin: No apparent rash Musculoskeletal: No spine tenderness  LAB:  CBC  Lab Results  Component Value Date   WBC 6.3 05/06/2015   HGB 15.0 05/06/2015   HCT 44.2 05/06/2015   MCV 87.7 05/06/2015   PLT 161 05/06/2015   NEUTROABS 3.4 05/06/2015     CMP      Component Value Date/Time   NA 140 05/06/2015 1214   K 4.4 05/06/2015 1214   CL 105 05/06/2015 1214   CO2 28 05/06/2015 1214   GLUCOSE 98 05/06/2015 1214   BUN 15 05/06/2015 1214    CREATININE 0.76 05/06/2015 1214   CREATININE 0.76 08/30/2010 0935   CALCIUM 9.1 05/06/2015 1214   PROT 6.3 05/06/2015 1214   ALBUMIN 3.9 05/06/2015 1214   AST 23 05/06/2015 1214   ALT 16 05/06/2015 1214   ALKPHOS 64 05/06/2015 1214   BILITOT 1.2 05/06/2015 1214   GFRNONAA 86 05/06/2015 1214   GFRNONAA >60 08/30/2010 0935   GFRAA >89 05/06/2015 1214   GFRAA  08/30/2010 0935    >60        The eGFR has been calculated using the MDRD equation. This calculation has not been validated in all clinical situations. eGFR's persistently <60 mL/min signify possible Chronic Kidney Disease.      Imaging:  PET scan 07/05/2015 reviewed   Assessment/Plan:   1. Adenocarcinoma at 20 cm from the anal verge, most consistent with sigmoid colon cancer  Staging PET scan 07/05/2015 with no evidence of metastatic disease  Endoscopic ultrasound 06/29/2015 consistent with a T2N0 tumor  2.   History of coronary artery disease  Disposition:   Keith [name] has been diagnosed with colon cancer. He appears to have early stage disease based on the diagnostic evaluation to date. His case was presented at the GI tumor conference on 07/13/2015. The opinion of the GI tumor group is that he most likely has a sigmoid colon tumor. The plan is to proceed with definitive surgery.  I will see Keith [name] following surgery to discuss the pathology report and recommend adjuvant therapy as indicated.  Approximately 45 minutes were spent with the patient today. The majority of the time was used for counseling and coordination of care.  [place], [place] 07/15/2015, 3:34 PM

## 2015-07-15 NOTE — Telephone Encounter (Signed)
Gave and printed appt sched and avs fo rpt; for NOV  °

## 2015-07-15 NOTE — Patient Instructions (Addendum)
Tips for Energy Conservation for Activities of Daily Living . Plan ahead to avoid rushing. . Sit down to bathe and dry off. Wear a terry robe instead of drying off. . Use a shower/bath organizer to decrease leaning and reaching. . Use extension handles on sponges and brushes. Susa Simmonds grab rails in the bathroom or use an elevated toilet seat. Hoyle Barr out clothes and toiletries before dressing. . Minimize leaning over to put on clothes and shoes. Bring your foot to your knee to apply socks and shoes. . Wear comfortable shoes and low-heeled, slip on shoes. Wear button front shirts rather than pullovers. Housekeeping . Schedule household tasks throughout the week. . Do housework sitting down when possible. . Delegate heavy housework, shopping, laundry and child care when possible. . Drag or slide objects rather than lifting. . Sit when ironing and take rest periods. . Stop working before becoming overly tired. Shopping . Organize list by aisle. . Use a grocery cart for support. Marland Kitchen Shop at less busy times. . Ask for help with getting to the car. Meal Preparation . Use convenience and easy-to-prepare foods. . Use small appliances that take less effort to use. Marland Kitchen Prepare meals sitting down. . Soak dishes instead of scrubbing and let dishes air dry. . Prepare double portions and freeze half. Child Care . Plan activities that can be done sitting down, such as drawing pictures, playing games, reading, and computer games. . Encourage children to climb up onto your lap or into the highchair instead of being lifted. . Make a game of the household chores so that children will want to help. . Delegate child care when possible.  Earlie Counts, PT, [facility]; [place], [place], [place] [address] Toileting Techniques for Bowel Movements (Defecation) Using your belly (abdomen) and pelvic floor muscles to have a bowel movement is usually  instinctive.  Sometimes people can have problems with these muscles and have to relearn proper defecation (emptying) techniques.  If you have weakness in your muscles, organs that are falling out, decreased sensation in your pelvis, or ignore your urge to go, you may find yourself straining to have a bowel movement.  You are straining if you are: . holding your breath or taking in a huge gulp of air and holding it  . keeping your lips and jaw tensed and closed tightly . turning red in the face because of excessive pushing or forcing . developing or worsening your  hemorrhoids . getting faint while pushing . not emptying completely and have to defecate many times a day  If you are straining, you are actually making it harder for yourself to have a bowel movement.  Many people find they are pulling up with the pelvic floor muscles and closing off instead of opening the anus. Due to lack pelvic floor relaxation and coordination the abdominal muscles, one has to work harder to push the feces out.  Many people have never been taught how to defecate efficiently and effectively.  Notice what happens to your body when you are having a bowel movement.  While you are sitting on the toilet pay attention to the following areas: . Jaw and mouth position . Angle of your hips   . Whether your feet touch the ground or not . Arm placement  . Spine position . Waist . Belly tension . Anus (opening of the anal canal)  An Evacuation/Defecation Plan   Here are the 4 basic points:  1. Lean forward enough for your elbows to rest on your knees 2. Support your feet on the floor or use a low stool if your feet don't touch the floor  3. Push out your belly as if you have swallowed a beach ball-you should feel a widening of your waist 4. Open and relax your pelvic floor muscles, rather than tightening around the anus      The following conditions my require modifications to your toileting posture:  . If you have  had surgery in the past that limits your back, hip, pelvic, knee or ankle flexibility . Constipation   Your healthcare practitioner may make the following additional suggestions and adjustments:  1) Sit on the toilet  a) Make sure your feet are supported. b) Notice your hip angle and spine position-most people find it effective to lean forward or raise their knees, which can help the muscles around the anus to relax  c) When you lean forward, place your forearms on your thighs for support  2) Relax suggestions a) Breath deeply in through your nose and out slowly through your mouth as if you are smelling the flowers and blowing out the candles. b) To become aware of how to relax your muscles, contracting and releasing muscles can be helpful.  Pull your pelvic floor muscles in tightly by using the image of holding back gas, or closing around the anus (visualize making a circle smaller) and lifting the anus up and in.  Then release the muscles and your anus should drop down and feel open. Repeat 5 times ending with the feeling of relaxation. c) Keep your pelvic floor muscles relaxed; let your belly bulge out. d) The digestive tract starts at the mouth and ends at the anal opening, so be sure to relax both ends of the tube.  Place your tongue on the roof of your mouth with your teeth separated.  This helps relax your mouth and will help to relax the anus at the same time.  3) Empty (defecation) a) Keep your pelvic floor and sphincter relaxed, then bulge your anal muscles.  Make the anal opening wide.  b) Stick your belly out as if you have swallowed a beach ball. c) Make your belly wall hard using your belly muscles while continuing to breathe. Doing this makes it easier to open your anus. d) Breath out and give a grunt (or try using other sounds such as ahhhh, shhhhh, ohhhh or grrrrrrr).  4) Finish a) As you finish your bowel movement, pull the pelvic floor muscles up and in.  This will leave  your anus in the proper place rather than remaining pushed out and down. If you leave your anus pushed out and down, it will start to feel as though that is normal and give you incorrect signals about needing to have a bowel movement.    Earlie Counts, PT [facility] [address] [place], [place] [address] Toileting Techniques for Bowel Movements (Defecation) Using your belly (abdomen) and pelvic floor muscles to have a bowel movement is usually instinctive.  Sometimes people can have problems with these muscles and have to relearn proper defecation (emptying) techniques.  If you have weakness in your muscles, organs that are falling out, decreased sensation in your pelvis, or ignore your urge to go, you may find yourself straining to have a bowel movement.  You are straining if you are: . holding your breath or taking in a huge gulp of air and  holding it  . keeping your lips and jaw tensed and closed tightly . turning red in the face because of excessive pushing or forcing . developing or worsening your  hemorrhoids . getting faint while pushing . not emptying completely and have to defecate many times a day  If you are straining, you are actually making it harder for yourself to have a bowel movement.  Many people find they are pulling up with the pelvic floor muscles and closing off instead of opening the anus. Due to lack pelvic floor relaxation and coordination the abdominal muscles, one has to work harder to push the feces out.  Many people have never been taught how to defecate efficiently and effectively.  Notice what happens to your body when you are having a bowel movement.  While you are sitting on the toilet pay attention to the following areas: . Jaw and mouth position . Angle of your hips   . Whether your feet touch the ground or not . Arm placement  . Spine position . Waist . Belly tension . Anus (opening of the anal canal)  An  Evacuation/Defecation Plan   Here are the 4 basic points:  5. Lean forward enough for your elbows to rest on your knees 6. Support your feet on the floor or use a low stool if your feet don't touch the floor  7. Push out your belly as if you have swallowed a beach ball-you should feel a widening of your waist 8. Open and relax your pelvic floor muscles, rather than tightening around the anus      The following conditions my require modifications to your toileting posture:  . If you have had surgery in the past that limits your back, hip, pelvic, knee or ankle flexibility . Constipation   Your healthcare practitioner may make the following additional suggestions and adjustments:  4) Sit on the toilet  a) Make sure your feet are supported. b) Notice your hip angle and spine position-most people find it effective to lean forward or raise their knees, which can help the muscles around the anus to relax  c) When you lean forward, place your forearms on your thighs for support  5) Relax suggestions a) Breath deeply in through your nose and out slowly through your mouth as if you are smelling the flowers and blowing out the candles. b) To become aware of how to relax your muscles, contracting and releasing muscles can be helpful.  Pull your pelvic floor muscles in tightly by using the image of holding back gas, or closing around the anus (visualize making a circle smaller) and lifting the anus up and in.  Then release the muscles and your anus should drop down and feel open. Repeat 5 times ending with the feeling of relaxation. c) Keep your pelvic floor muscles relaxed; let your belly bulge out. d) The digestive tract starts at the mouth and ends at the anal opening, so be sure to relax both ends of the tube.  Place your tongue on the roof of your mouth with your teeth separated.  This helps relax your mouth and will help to relax the anus at the same time.  6) Empty (defecation) a) Keep your  pelvic floor and sphincter relaxed, then bulge your anal muscles.  Make the anal opening wide.  b) Stick your belly out as if you have swallowed a beach ball. c) Make your belly wall hard using your belly muscles while continuing to breathe. Doing this  makes it easier to open your anus. d) Breath out and give a grunt (or try using other sounds such as ahhhh, shhhhh, ohhhh or grrrrrrr).  4) Finish a) As you finish your bowel movement, pull the pelvic floor muscles up and in.  This will leave your anus in the proper place rather than remaining pushed out and down. If you leave your anus pushed out and down, it will start to feel as though that is normal and give you incorrect signals about needing to have a bowel movement.    Earlie Counts, PT [facility] [address] [place], [place] [address]    Pelvic Floor Exercises for Bowel Control Exercises using both the external anal sphincter and the deep pelvic floor muscles can help you to improve your bowel control. When done correctly, these exercises can tone and strengthen the muscles to help you hold back gas and prevent fecal incontinence (leakage of stool). Exercise programs take time; you may not see any noticeable change in your bowel control immediately.  In some cases it may take several months to regain control. Bowel Control Muscles The anus and the anal canal, has rings of muscle around it. The outer ring of muscle is called the external anal sphincter; it is a voluntary muscle which you can learn to tighten and close more efficiently. When you contract it you will feel the skin around your anus tighten and pull in as if the anus is winking. Try to keep the buttocks muscles relaxed. The inner ring around the anus is the internal anal sphincter. It is an involuntary and automatic muscle; you don't have to think to keep it closed or open.  This muscle should be closed at all times, except when you are actually  trying to have a bowel movement.  In addition to the sphincter muscles, there are deeper muscles called the levator ani that form a sling from your tailbone to your pubic bone. The levator ani muscle has a specific part called the puborectalis that holds stool in until you give the signal to relax and empty.  When you contract these muscles it creates a feeling of lifting the anus inward.   External Anal Sphincter     Levator ani deep layer   Effective Exercises for Control of Gas and Bowels . Identify the specific areas of the pelvic floor muscles you need to use.  This can be done using a mirror to see if you are contracting the correct muscles or by placing the pad of your finger at or just inside the anal opening. . Develop an exercise plan for strength, endurance and quick response of the muscles and stick with it.  You must make the muscles do more than they are used to doing. . Incorporate the exercises into your daily activities.

## 2015-07-15 NOTE — Progress Notes (Signed)
Patient was seen in GI clinic.  81 year old male diagnosed with rectal cancer.  He is a patient of Dr. Julieanne Manson.  Past medical history includes hypertension, chronic kidney disease.  Medications include Lipitor, vitamin D, magnesium, omega-3 fatty acid, and Zantac.  Labs include HG A1c 6.0 on July 29.  Height: 5 feet 11 inches. Weight: 165.2 pounds October 7. Usual body weight: 165-170 pounds. BMI: 23.05.  Patient will be scheduled for surgery first and then decision will be made regarding further treatment. Met with patient and wife. Patient reports no nutrition impact symptoms currently. He is eating well.  He denies weight loss.  Nutrition diagnosis: Food and nutrition related knowledge deficit related to diagnosis of rectal cancer and associated treatments as evidenced by no prior need for nutrition related information.  Intervention:  Educated patient on the importance of adequate calories and protein to promote healing after surgery. Provided fact sheet on foods with increased protein. Briefly educated patient on strategies for following a low fiber diet If he develops diarrhea. Answered questions about processed meats and recommendations to avoid if possible. Contact information was  provided.  Teach back method used.  Monitoring, evaluation, goals: Patient will tolerate adequate calories and protein to promote healing after surgery.  Next visit will be scheduled as needed.    **Disclaimer: This note was dictated with voice recognition software. Similar sounding words can inadvertently be transcribed and this note may contain transcription errors which may not have been corrected upon publication of note.**

## 2015-07-15 NOTE — Progress Notes (Signed)
[facility] Psychosocial Distress Screening Clinical Social Work  Clinical Social Work met with pt and his wife at [facility] to introduce self, explain role of CSW and review distress screening protocol.  The patient scored a 2 on the Psychosocial Distress Thermometer which indicates no distress. Pt reports he "has no social problems" and not in need of CSW support currently. He reports to have a strong support system as well. Clinical Social Worker discussed common emotions experienced by newly diagnosed patients, resources to assist and how to contact CSW/Patient and Family Support Team as needed. Pt and wife appreciated support and information and agree to reach out as needed.    ONCBCN DISTRESS SCREENING 07/15/2015  Screening Type Initial Screening  Distress experienced in past week (1-10) 2  Emotional problem type Adjusting to illness  Information Concerns Type Lack of info about diagnosis  Referral to support programs Yes    Clinical Social Worker follow up needed: No.  If yes, follow up plan: Loren Racer, [place]  [facility] Phone: [phone] Fax: [phone]

## 2015-07-15 NOTE — Progress Notes (Signed)
Oncology Nurse Navigator Documentation  Oncology Nurse Navigator Flowsheets 07/15/2015  Referral date to RadOnc/MedOnc 07/07/2015  Navigator Encounter Type Clinic/MDC  Patient Visit Type Medonc  Treatment Phase Treatment planning  Barriers/Navigation Needs Education  Education Understanding Cancer/ Treatment Options;Preparing for Upcoming Surgery  Interventions Education Method  Education Method Verbal;Written;Teach-back  Support Groups/Services GI;ACS--Patient Health Manager  Time Spent with Patient 15  Met with patient and wife, Pamala Hurry during new patient visit. Explained the role of the GI Nurse Navigator and provided New Patient Packet with information on: 1. Colon cancer 2. Support groups 3. Fall Safety Plan Answered questions, reviewed current treatment plan using TEACH back and provided emotional support. Provided copy of current treatment plan. Explained CEA lab test and significance. Was also seen today by CSW, dietician and PT.  Merceda Elks, RN, BSN GI Oncology [place]

## 2015-07-18 LAB — CEA: CEA: 1.3 ng/mL (ref 0.0–5.0)

## 2015-07-20 ENCOUNTER — Telehealth: Payer: Self-pay | Admitting: *Deleted

## 2015-07-20 NOTE — Telephone Encounter (Signed)
Per Dr. Benay Spice; notified pt's wife that cea is normal, f/u as scheduled.  Wife verbalized understanding and expressed appreciation for call.

## 2015-07-20 NOTE — Telephone Encounter (Signed)
-----   Message from Ladell Pier, MD sent at 07/18/2015  6:36 PM EDT ----- Please call patient, cea is normal, f/u as scheduled

## 2015-07-22 ENCOUNTER — Telehealth: Payer: Self-pay | Admitting: *Deleted

## 2015-07-22 NOTE — Telephone Encounter (Signed)
Oncology Nurse Navigator Documentation  Oncology Nurse Navigator Flowsheets 07/22/2015  Referral date to RadOnc/MedOnc -  Navigator Encounter Type 1 week F/U  Patient Visit Type -  Treatment Phase Preop  Barriers/Navigation Needs No barriers at this time;Education  Education Preparing for Upcoming Surgery-asking how long the surgery takes?  Interventions Message to surgeon's office to inquire.  Education Method -  Support Groups/Services -  Time Spent with Patient 15  Informed him the LOS with his surgery is 4-7 days. Early ambulation will help him go home sooner. Will get back with him regarding the length of time the procedure takes.

## 2015-07-25 NOTE — Telephone Encounter (Signed)
Called wife back and requested she let patient know that according to surgical office, the procedure will last 3-3 1/2 hours without complication. She will relay this to him.

## 2015-07-26 ENCOUNTER — Other Ambulatory Visit: Payer: Self-pay | Admitting: Internal Medicine

## 2015-07-27 NOTE — Patient Instructions (Addendum)
Keith [name]  07/27/2015   Your procedure is scheduled on: Tuesday 08/02/2015  Report to [name]  Entrance take [name]  elevators to 3rd floor to  [place] at  1000 AM.  Call this number if you have problems the morning of surgery [phone]   Remember: ONLY 1 PERSON MAY GO WITH YOU TO SHORT STAY TO GET  READY MORNING OF [name].  Do not eat food or drink liquids :After Midnight.   FOLLOW BOWEL PREP INSTRUCTIONS FROM DR. BYERLY'S OFFICE ON Monday 08/01/2015 AND A CLEAR LIQUID DIET!   Take these medicines the morning of surgery with A SIP OF WATER: ZANTAC              DO NOT TAKE ANY DIABETIC MEDICATIONS DAY OF YOUR SURGERY                               You may not have any metal on your body including hair pins and              piercings  Do not wear jewelry, make-up, lotions, powders or perfumes, deodorant             Do not wear nail polish.  Do not shave  48 hours prior to surgery.              Men may shave face and neck.   Do not bring valuables to the hospital. [name].  Contacts, dentures or bridgework may not be worn into surgery.  Leave suitcase in the car. After surgery it may be brought to your room.     Patients discharged the day of surgery will not be allowed to drive home.  Name and phone number of your driver:  Special Instructions: N/A              Please read over the following fact sheets you were given: _____________________________________________________________________             [name] - Preparing for Surgery Before surgery, you can play an important role.  Because skin is not sterile, your skin needs to be as free of germs as possible.  You can reduce the number of germs on your skin by washing with CHG (chlorahexidine gluconate) soap before surgery.  CHG is an antiseptic cleaner which kills germs and bonds with the skin to continue killing germs  even after washing. Please DO NOT use if you have an allergy to CHG or antibacterial soaps.  If your skin becomes reddened/irritated stop using the CHG and inform your nurse when you arrive at Short Stay. Do not shave (including legs and underarms) for at least 48 hours prior to the first CHG shower.  You may shave your face/neck. Please follow these instructions carefully:  1.  Shower with CHG Soap the night before surgery and the  morning of Surgery.  2.  If you choose to wash your hair, wash your hair first as usual with your  normal  shampoo.  3.  After you shampoo, rinse your hair and body thoroughly to remove the  shampoo.  4.  Use CHG as you would any other liquid soap.  You can apply chg directly  to the skin and wash                       Gently with a scrungie or clean washcloth.  5.  Apply the CHG Soap to your body ONLY FROM THE NECK DOWN.   Do not use on face/ open                           Wound or open sores. Avoid contact with eyes, ears mouth and genitals (private parts).                       Wash face,  Genitals (private parts) with your normal soap.             6.  Wash thoroughly, paying special attention to the area where your surgery  will be performed.  7.  Thoroughly rinse your body with warm water from the neck down.  8.  DO NOT shower/wash with your normal soap after using and rinsing off  the CHG Soap.                9.  Pat yourself dry with a clean towel.            10.  Wear clean pajamas.            11.  Place clean sheets on your bed the night of your first shower and do not  sleep with pets. Day of Surgery : Do not apply any lotions/deodorants the morning of surgery.  Please wear clean clothes to the hospital/surgery [name].  FAILURE TO FOLLOW THESE INSTRUCTIONS MAY RESULT IN THE CANCELLATION OF YOUR SURGERY PATIENT SIGNATURE_________________________________  NURSE  SIGNATURE__________________________________  ________________________________________________________________________   [name]  An incentive spirometer is a tool that can help keep your lungs clear and active. This tool measures how well you are filling your lungs with each breath. Taking long deep breaths may help reverse or decrease the chance of developing breathing (pulmonary) problems (especially infection) following:  A long period of time when you are unable to move or be active. BEFORE THE PROCEDURE   If the spirometer includes an indicator to show your best effort, your nurse or respiratory therapist will set it to a desired goal.  If possible, sit up straight or lean slightly forward. Try not to slouch.  Hold the incentive spirometer in an upright position. INSTRUCTIONS FOR USE   Sit on the edge of your bed if possible, or sit up as far as you can in bed or on a chair.  Hold the incentive spirometer in an upright position.  Breathe out normally.  Place the mouthpiece in your mouth and seal your lips tightly around it.  Breathe in slowly and as deeply as possible, raising the piston or the ball toward the top of the column.  Hold your breath for 3-5 seconds or for as long as possible. Allow the piston or ball to fall to the bottom of the column.  Remove the mouthpiece from your mouth and breathe out normally.  Rest for a few seconds and repeat Steps 1 through 7 at least 10 times every 1-2 hours when you are awake. Take your time and take a few normal breaths between deep breaths.  The spirometer may include an indicator to show  your best effort. Use the indicator as a goal to work toward during each repetition.  After each set of 10 deep breaths, practice coughing to be sure your lungs are clear. If you have an incision (the cut made at the time of surgery), support your incision when coughing by placing a pillow or rolled up towels firmly against it. Once  you are able to get out of bed, walk around indoors and cough well. You may stop using the incentive spirometer when instructed by your caregiver.  RISKS AND COMPLICATIONS  Take your time so you do not get dizzy or light-headed.  If you are in pain, you may need to take or ask for pain medication before doing incentive spirometry. It is harder to take a deep breath if you are having pain. AFTER USE  Rest and breathe slowly and easily.  It can be helpful to keep track of a log of your progress. Your caregiver can provide you with a simple table to help with this. If you are using the spirometer at home, follow these instructions: [place] IF:   You are having difficultly using the spirometer.  You have trouble using the spirometer as often as instructed.  Your pain medication is not giving enough relief while using the spirometer.  You develop fever of 100.5 F (38.1 C) or higher. SEEK IMMEDIATE MEDICAL CARE IF:   You cough up bloody sputum that had not been present before.  You develop fever of 102 F (38.9 C) or greater.  You develop worsening pain at or near the incision site. MAKE SURE YOU:   Understand these instructions.  Will watch your condition.  Will get help right away if you are not doing well or get worse. Document Released: 02/04/2007 Document Revised: 12/17/2011 Document Reviewed: 04/07/2007 ExitCare Patient Information 2014 ExitCare, [place].   ________________________________________________________________________  WHAT IS A BLOOD TRANSFUSION? Blood Transfusion Information  A transfusion is the replacement of blood or some of its parts. Blood is made up of multiple cells which provide different functions.  Red blood cells carry oxygen and are used for blood loss replacement.  White blood cells fight against infection.  Platelets control bleeding.  Plasma helps clot blood.  Other blood products are available for specialized needs, such as  hemophilia or other clotting disorders. BEFORE THE TRANSFUSION  Who gives blood for transfusions?   Healthy volunteers who are fully evaluated to make sure their blood is safe. This is blood bank blood. Transfusion therapy is the safest it has ever been in the practice of medicine. Before blood is taken from a donor, a complete history is taken to make sure that person has no history of diseases nor engages in risky social behavior (examples are intravenous drug use or sexual activity with multiple partners). The donor's travel history is screened to minimize risk of transmitting infections, such as malaria. The donated blood is tested for signs of infectious diseases, such as HIV and hepatitis. The blood is then tested to be sure it is compatible with you in order to minimize the chance of a transfusion reaction. If you or a relative donates blood, this is often done in anticipation of surgery and is not appropriate for emergency situations. It takes many days to process the donated blood. RISKS AND COMPLICATIONS Although transfusion therapy is very safe and saves many lives, the [name] dangers of transfusion include:   Getting an infectious disease.  Developing a transfusion reaction. This is an allergic reaction to  something in the blood you were given. Every precaution is taken to prevent this. The decision to have a blood transfusion has been considered carefully by your caregiver before blood is given. Blood is not given unless the benefits outweigh the risks. AFTER THE TRANSFUSION  Right after receiving a blood transfusion, you will usually feel much better and more energetic. This is especially true if your red blood cells have gotten low (anemic). The transfusion raises the level of the red blood cells which carry oxygen, and this usually causes an energy increase.  The nurse administering the transfusion will monitor you carefully for complications. HOME CARE INSTRUCTIONS  No special  instructions are needed after a transfusion. You may find your energy is better. Speak with your caregiver about any limitations on activity for underlying diseases you may have. SEEK MEDICAL CARE IF:   Your condition is not improving after your transfusion.  You develop redness or irritation at the intravenous (IV) site. SEEK IMMEDIATE MEDICAL CARE IF:  Any of the following symptoms occur over the next 12 hours:  Shaking chills.  You have a temperature by mouth above 102 F (38.9 C), not controlled by medicine.  Chest, back, or muscle pain.  People around you feel you are not acting correctly or are confused.  Shortness of breath or difficulty breathing.  Dizziness and fainting.  You get a rash or develop hives.  You have a decrease in urine output.  Your urine turns a dark color or changes to pink, red, or brown. Any of the following symptoms occur over the next 10 days:  You have a temperature by mouth above 102 F (38.9 C), not controlled by medicine.  Shortness of breath.  Weakness after normal activity.  The white part of the eye turns yellow (jaundice).  You have a decrease in the amount of urine or are urinating less often.  Your urine turns a dark color or changes to pink, red, or brown. Document Released: 09/21/2000 Document Revised: 12/17/2011 Document Reviewed: 05/10/2008 [name] Patient Information 2014 [place].    CLEAR LIQUID DIET   Foods Allowed                                                                     Foods Excluded  Coffee and tea, regular and decaf                             liquids that you cannot  Plain Jell-O in any flavor                                             see through such as: Fruit ices (not with fruit pulp)                                     milk, soups, orange juice  Iced Popsicles  All solid food Carbonated beverages, regular and diet                                     Cranberry, grape and apple juices Sports drinks like Gatorade Lightly seasoned clear broth or consume(fat free) Sugar, honey syrup  Sample Menu Breakfast                                Lunch                                     Supper Cranberry juice                    Beef broth                            Chicken broth Jell-O                                     Grape juice                           Apple juice Coffee or tea                        Jell-O                                      Popsicle                                                Coffee or tea                        Coffee or tea  _____________________________________________________________________   _______________________________________________________________________

## 2015-07-28 ENCOUNTER — Encounter (HOSPITAL_COMMUNITY): Payer: Self-pay

## 2015-07-28 ENCOUNTER — Encounter (HOSPITAL_COMMUNITY)
Admission: RE | Admit: 2015-07-28 | Discharge: 2015-07-28 | Disposition: A | Discharge status: Home / Self Care | Payer: Commercial Managed Care - HMO | Source: Ambulatory Visit | Attending: General Surgery | Admitting: General Surgery

## 2015-07-28 DIAGNOSIS — C19 Malignant neoplasm of rectosigmoid junction: Secondary | ICD-10-CM | POA: Diagnosis not present

## 2015-07-28 DIAGNOSIS — Z01818 Encounter for other preprocedural examination: Secondary | ICD-10-CM | POA: Insufficient documentation

## 2015-07-28 HISTORY — DX: Malignant (primary) neoplasm, unspecified: C80.1

## 2015-07-28 HISTORY — DX: Pneumonia, unspecified organism: J18.9

## 2015-07-28 HISTORY — DX: Unspecified hemorrhoids: K64.9

## 2015-07-28 LAB — URINALYSIS, ROUTINE W REFLEX MICROSCOPIC
Bilirubin Urine: NEGATIVE
GLUCOSE, UA: NEGATIVE mg/dL
Hgb urine dipstick: NEGATIVE
KETONES UR: NEGATIVE mg/dL
LEUKOCYTES UA: NEGATIVE
NITRITE: NEGATIVE
PROTEIN: NEGATIVE mg/dL
Specific Gravity, Urine: 1.017 (ref 1.005–1.030)
UROBILINOGEN UA: 0.2 mg/dL (ref 0.0–1.0)
pH: 5 (ref 5.0–8.0)

## 2015-07-28 LAB — COMPREHENSIVE METABOLIC PANEL
ALBUMIN: 3.7 g/dL (ref 3.5–5.0)
ALT: 17 U/L (ref 17–63)
ANION GAP: 5 (ref 5–15)
AST: 28 U/L (ref 15–41)
Alkaline Phosphatase: 61 U/L (ref 38–126)
BILIRUBIN TOTAL: 1.7 mg/dL — AB (ref 0.3–1.2)
BUN: 15 mg/dL (ref 6–20)
CHLORIDE: 105 mmol/L (ref 101–111)
CO2: 31 mmol/L (ref 22–32)
Calcium: 9.3 mg/dL (ref 8.9–10.3)
Creatinine, Ser: 0.78 mg/dL (ref 0.61–1.24)
GFR calc Af Amer: >60 mL/min (ref >60)
GFR calc non Af Amer: >60 mL/min (ref >60)
GLUCOSE: 92 mg/dL (ref 65–99)
POTASSIUM: 4.9 mmol/L (ref 3.5–5.1)
SODIUM: 141 mmol/L (ref 135–145)
Total Protein: 6.9 g/dL (ref 6.5–8.1)

## 2015-07-28 LAB — CBC WITH DIFFERENTIAL/PLATELET
BASOS ABS: 0 10*3/uL (ref 0.0–0.1)
BASOS PCT: 0 %
EOS ABS: 0.3 10*3/uL (ref 0.0–0.7)
Eosinophils Relative: 4 %
HEMATOCRIT: 43.7 % (ref 39.0–52.0)
Hemoglobin: 14.8 g/dL (ref 13.0–17.0)
Lymphocytes Relative: 28 %
Lymphs Abs: 1.6 10*3/uL (ref 0.7–4.0)
MCH: 30.3 pg (ref 26.0–34.0)
MCHC: 33.9 g/dL (ref 30.0–36.0)
MCV: 89.4 fL (ref 78.0–100.0)
MONO ABS: 0.5 10*3/uL (ref 0.1–1.0)
MONOS PCT: 8 %
NEUTROS ABS: 3.5 10*3/uL (ref 1.7–7.7)
NEUTROS PCT: 60 %
Platelets: 155 10*3/uL (ref 150–400)
RBC: 4.89 MIL/uL (ref 4.22–5.81)
RDW: 12.8 % (ref 11.5–15.5)
WBC: 5.8 10*3/uL (ref 4.0–10.5)

## 2015-07-28 LAB — ABO/RH: ABO/RH(D): A POS

## 2015-07-29 LAB — HEMOGLOBIN A1C
Hgb A1c MFr Bld: 5.8 % — ABNORMAL HIGH (ref 4.8–5.6)
MEAN PLASMA GLUCOSE: 120 mg/dL

## 2015-08-01 NOTE — H&P (Signed)
Keith [name] 07/11/2015 11:44 AM Location: [facility] Patient #: [id number] DOB: 12-10-1931 Married / Language: English / Race: White Male   History of Present Illness Keith Klein MD; 07/11/2015 12:49 PM) The patient is a 81 year old male who presents with colorectal cancer. Patient is an 81 year old male who presented with a heme positive stools and change in bowel habits. He underwent a colonoscopy and was found to have a colon cancer at approximately 20 cm. The mass was ulcerated. Tattoo had been placed by Dr. Amedeo Plenty. The patient had had a previous colonoscopy around 7 years ago that was negative. The original plan had been not to get an additional colonoscopy since he was over 82, however with the heme positive stool and the change in bowel habits he wanted to proceed with testing. He is very healthy on minimal medications and walks 30-45 minutes a day. He has had increased difficulty with walking recently because he starts ambulating and within 5 minutes feels like he has to have another bowel movement. He had a PET scan which demonstrated hypermetabolic region in the proximal rectum. The length of this was 4 cm. There was some disparity with the colonoscopy and endoscopic ultrasound showing a lesion at 20 cm and the scan suggesting that this was at 8 cm. The patient has not had any frankly bloody stools or symptoms of anemia.   Other Problems Elbert Ewings, CMA; 07/11/2015 11:45 AM) Back Pain Colon Cancer Gastroesophageal Reflux Disease Hemorrhoids Hypercholesterolemia  Past Surgical History Elbert Ewings, CMA; 07/11/2015 11:45 AM) Colon Polyp Removal - Colonoscopy Laparoscopic Inguinal Hernia Surgery Left.  Diagnostic Studies History Elbert Ewings, [place]; 07/11/2015 11:45 AM) Colonoscopy within last year  Allergies Elbert Ewings, CMA; 07/11/2015 11:45 AM) Penicillin G Potassium *PENICILLINS*  Medication History Keith Klein, MD; 07/11/2015 12:50  PM) Atorvastatin Calcium ('40MG'$  Tablet, Oral) Active. Aspirin ('81MG'$  Tablet, Oral) Active. Vitamin D3 Active. Magnesium ('250MG'$  Tablet, Oral) Active. Zantac ('150MG'$  Tablet, Oral as needed) Active. Fish Oil Active. Medications Reconciled  Social History Elbert Ewings, [place]; 07/11/2015 11:45 AM) Alcohol use Occasional alcohol use. Caffeine use Carbonated beverages, Coffee. No drug use Tobacco use Former smoker.  Family History Elbert Ewings, [place]; 07/11/2015 11:45 AM) Heart Disease Father. Migraine Headache Sister. Thyroid problems Daughter.    Review of Systems Elbert Ewings CMA; 07/11/2015 11:45 AM) General Not Present- Appetite Loss, Chills, Fatigue, Fever, Night Sweats, Weight Gain and Weight Loss. Skin Present- Rash. Not Present- Change in Wart/Mole, Dryness, Hives, Jaundice, New Lesions, Non-Healing Wounds and Ulcer. HEENT Present- Hoarseness and Sore Throat. Not Present- Earache, Hearing Loss, Nose Bleed, Oral Ulcers, Ringing in the Ears, Seasonal Allergies, Sinus Pain, Visual Disturbances, Wears glasses/contact lenses and Yellow Eyes. Respiratory Not Present- Bloody sputum, Chronic Cough, Difficulty Breathing, Snoring and Wheezing. Breast Not Present- Breast Mass, Breast Pain, Nipple Discharge and Skin Changes. Cardiovascular Not Present- Chest Pain, Difficulty Breathing Lying Down, Leg Cramps, Palpitations, Rapid Heart Rate, Shortness of Breath and Swelling of Extremities. Gastrointestinal Present- Change in Bowel Habits and Hemorrhoids. Not Present- Abdominal Pain, Bloating, Bloody Stool, Chronic diarrhea, Constipation, Difficulty Swallowing, Excessive gas, Gets full quickly at meals, Indigestion, Nausea, Rectal Pain and Vomiting. Male Genitourinary Not Present- Blood in Urine, Change in Urinary Stream, Frequency, Impotence, Nocturia, Painful Urination, Urgency and Urine Leakage. Musculoskeletal Present- Back Pain. Not Present- Joint Pain, Joint Stiffness, Muscle Pain, Muscle  Weakness and Swelling of Extremities. Neurological Not Present- Decreased Memory, Fainting, Headaches, Numbness, Seizures, Tingling, Tremor, Trouble walking and Weakness. Psychiatric Not Present- Anxiety, Bipolar,  Change in Sleep Pattern, Depression, Fearful and Frequent crying. Endocrine Not Present- Cold Intolerance, Excessive Hunger, Hair Changes, Heat Intolerance, Hot flashes and New Diabetes. Hematology Not Present- Easy Bruising, Excessive bleeding, Gland problems, HIV and Persistent Infections.  Vitals Elbert Ewings CMA; 07/11/2015 11:47 AM) 07/11/2015 11:47 AM Weight: 165 lb Height: 70in Body Surface Area: 1.92 m Body Mass Index: 23.67 kg/m  Temp.: 98.2F(Temporal)  Pulse: 77 (Regular)  BP: 128/70 (Sitting, Left Arm, Standard)     Physical Exam Keith Klein MD; 07/11/2015 12:50 PM) General Mental Status-Alert. General Appearance-Consistent with stated age. Hydration-Well hydrated. Voice-Normal.  Head and Neck Head-normocephalic, atraumatic with no lesions or palpable masses. Trachea-midline. Thyroid Gland Characteristics - normal size and consistency.  Eye Eyeball - Bilateral-Extraocular movements intact. Sclera/Conjunctiva - Bilateral-No scleral icterus.  Chest and Lung Exam Chest and lung exam reveals -quiet, even and easy respiratory effort with no use of accessory muscles and on auscultation, normal breath sounds, no adventitious sounds and normal vocal resonance. Inspection Chest Wall - Normal. Back - normal.  Cardiovascular Cardiovascular examination reveals -normal heart sounds, regular rate and rhythm with no murmurs and normal pedal pulses bilaterally.  Abdomen Inspection Inspection of the abdomen reveals - No Hernias. Palpation/Percussion Palpation and Percussion of the abdomen reveal - Soft, Non Tender, No Rebound tenderness, No Rigidity (guarding) and No hepatosplenomegaly. Auscultation Auscultation of the abdomen  reveals - Bowel sounds normal.  Neurologic Neurologic evaluation reveals -alert and oriented x 3 with no impairment of recent or remote memory. Mental Status-Normal.  Musculoskeletal Global Assessment -Note: no gross deformities.  Normal Exam - Left-Upper Extremity Strength Normal and Lower Extremity Strength Normal. Normal Exam - Right-Upper Extremity Strength Normal and Lower Extremity Strength Normal.  Lymphatic Head & Neck  General Head & Neck Lymphatics: Bilateral - Description - Normal. Axillary  General Axillary Region: Bilateral - Description - Normal. Tenderness - Non Tender. Femoral & Inguinal  Generalized Femoral & Inguinal Lymphatics: Bilateral - Description - No Generalized lymphadenopathy.    Assessment & Plan Keith Klein MD; 07/11/2015 12:51 PM) CARCINOMA OF RECTOSIGMOID (COLON) (C19) Impression: Patient has a new diagnosis of uT2N0 cancer of the rectosigmoid colon.  I would plan to do upfront surgery on him.  I discussed what the surgery would entail. I would do this in a minimally invasive fashion either with the robot or laparoscopically. I reviewed the incisions as well as the portion of the colon that would be removed. I extensively discussed the risks of surgery including bleeding, infection, damage to nearby structures such as pelvic nerves used for sexual function and urination and the ureters.  I also reviewed the risk of heart and lung complications, blood clots, anastomotic leak. I discussed that if this occurs or if the connection and surgery is not ideal, that we may need to do an ostomy.  I discussed the risk of postop delirium, death, and hernia.  I reviewed that he may need postoperative radiation or chemotherapy. I also discussed that he may have more frequent bowel movements and may have urgency issues similar to what he has now.  He understands and wishes to proceed. 45 min spent in evaluation, examination, counseling, and  coordination of care. >50% spent in counseling. Current Plans Pt Education - [facility] Colon Bowel Prep 2015 Miralax/Antibiotics Started Neomycin Sulfate '500MG'$ , 2 (two) Tablet SEE NOTE, #6, 07/11/2015, No Refill. Local Order: TAKE TWO TABLETS AT 2 PM, 3 PM, AND 10 PM THE DAY PRIOR TO SURGERY Started Flagyl '500MG'$ , 2 (two) Tablet SEE NOTE, #  6, 07/11/2015, No Refill. Local Order: Take at 2pm, 3pm, and 10pm the day prior to your colon operation You are being scheduled for surgery - Our schedulers will call you.  You should hear from our office's scheduling department within 5 working days about the location, date, and time of surgery. We try to make accommodations for patient's preferences in scheduling surgery, but sometimes the OR schedule or the surgeon's schedule prevents [place] from making those accommodations.  If you have not heard from our office [phone]) in 5 working days, call the office and ask for your surgeon's nurse.  If you have other questions about your diagnosis, plan, or surgery, call the office and ask for your surgeon's nurse.  Pt Education - flb sigmoid colon: discussed with patient and provided information.   Signed by Keith Klein, MD (07/11/2015 12:53 PM)

## 2015-08-02 ENCOUNTER — Encounter (HOSPITAL_COMMUNITY)
Admission: AD | Disposition: A | Discharge status: Home / Self Care | Payer: Self-pay | Source: Ambulatory Visit | Attending: General Surgery

## 2015-08-02 ENCOUNTER — Encounter (HOSPITAL_COMMUNITY): Payer: Self-pay | Admitting: Anesthesiology

## 2015-08-02 ENCOUNTER — Inpatient Hospital Stay (HOSPITAL_COMMUNITY): Payer: Commercial Managed Care - HMO | Admitting: Anesthesiology

## 2015-08-02 ENCOUNTER — Inpatient Hospital Stay (HOSPITAL_COMMUNITY)
Admission: AD | Admit: 2015-08-02 | Discharge: 2015-08-06 | DRG: 330 | Disposition: A | Discharge status: Home / Self Care | Payer: Commercial Managed Care - HMO | Source: Ambulatory Visit | Attending: General Surgery | Admitting: General Surgery

## 2015-08-02 DIAGNOSIS — Z880 Allergy status to penicillin: Secondary | ICD-10-CM

## 2015-08-02 DIAGNOSIS — K649 Unspecified hemorrhoids: Secondary | ICD-10-CM | POA: Diagnosis present

## 2015-08-02 DIAGNOSIS — Z8249 Family history of ischemic heart disease and other diseases of the circulatory system: Secondary | ICD-10-CM | POA: Diagnosis not present

## 2015-08-02 DIAGNOSIS — K219 Gastro-esophageal reflux disease without esophagitis: Secondary | ICD-10-CM | POA: Diagnosis present

## 2015-08-02 DIAGNOSIS — C775 Secondary and unspecified malignant neoplasm of intrapelvic lymph nodes: Secondary | ICD-10-CM | POA: Diagnosis present

## 2015-08-02 DIAGNOSIS — Z7982 Long term (current) use of aspirin: Secondary | ICD-10-CM

## 2015-08-02 DIAGNOSIS — C19 Malignant neoplasm of rectosigmoid junction: Principal | ICD-10-CM | POA: Diagnosis present

## 2015-08-02 DIAGNOSIS — Z79899 Other long term (current) drug therapy: Secondary | ICD-10-CM

## 2015-08-02 DIAGNOSIS — E7800 Pure hypercholesterolemia, unspecified: Secondary | ICD-10-CM | POA: Diagnosis present

## 2015-08-02 DIAGNOSIS — M549 Dorsalgia, unspecified: Secondary | ICD-10-CM | POA: Diagnosis present

## 2015-08-02 HISTORY — PX: XI ROBOTIC ASSISTED LOWER ANTERIOR RESECTION: SHX6558

## 2015-08-02 LAB — TYPE AND SCREEN
ABO/RH(D): A POS
Antibody Screen: NEGATIVE

## 2015-08-02 LAB — CBC
HEMATOCRIT: 40.7 % (ref 39.0–52.0)
HEMOGLOBIN: 13.9 g/dL (ref 13.0–17.0)
MCH: 30.2 pg (ref 26.0–34.0)
MCHC: 34.2 g/dL (ref 30.0–36.0)
MCV: 88.3 fL (ref 78.0–100.0)
Platelets: 132 10*3/uL — ABNORMAL LOW (ref 150–400)
RBC: 4.61 MIL/uL (ref 4.22–5.81)
RDW: 12.9 % (ref 11.5–15.5)
WBC: 11.8 10*3/uL — ABNORMAL HIGH (ref 4.0–10.5)

## 2015-08-02 LAB — CREATININE, SERUM
Creatinine, Ser: 0.87 mg/dL (ref 0.61–1.24)
GFR calc non Af Amer: >60 mL/min (ref >60)

## 2015-08-02 SURGERY — RESECTION, RECTUM, LOW ANTERIOR, ROBOT-ASSISTED
Anesthesia: General

## 2015-08-02 MED ORDER — OXYCODONE-ACETAMINOPHEN 5-325 MG PO TABS: 1.0000 | ORAL_TABLET | ORAL | Status: DC | PRN

## 2015-08-02 MED ORDER — ATORVASTATIN CALCIUM 40 MG PO TABS
40.0000 mg | ORAL_TABLET | Freq: Every day | ORAL | Status: DC
  Administered 2015-08-02 – 2015-08-05 (×4): 40 mg via ORAL
  Filled 2015-08-02 (×5): qty 1

## 2015-08-02 MED ORDER — CHLORHEXIDINE GLUCONATE CLOTH 2 % EX PADS: 6.0000 | MEDICATED_PAD | Freq: Once | CUTANEOUS | Status: DC

## 2015-08-02 MED ORDER — HYDROMORPHONE HCL 2 MG/ML IJ SOLN
INTRAMUSCULAR | Status: AC
  Filled 2015-08-02: qty 1

## 2015-08-02 MED ORDER — EPHEDRINE SULFATE 50 MG/ML IJ SOLN
INTRAMUSCULAR | Status: DC | PRN
  Administered 2015-08-02 (×2): 5 mg via INTRAVENOUS
  Administered 2015-08-02 (×2): 10 mg via INTRAVENOUS

## 2015-08-02 MED ORDER — NALOXONE HCL 0.4 MG/ML IJ SOLN: 0.4000 mg | INTRAMUSCULAR | Status: DC | PRN

## 2015-08-02 MED ORDER — DIPHENHYDRAMINE HCL 50 MG/ML IJ SOLN: 12.5000 mg | Freq: Four times a day (QID) | INTRAMUSCULAR | Status: DC | PRN

## 2015-08-02 MED ORDER — DEXTROSE 5 % IV SOLN
2.0000 g | INTRAVENOUS | Status: DC
  Filled 2015-08-02: qty 2

## 2015-08-02 MED ORDER — ONDANSETRON HCL 4 MG/2ML IJ SOLN: 4.0000 mg | Freq: Four times a day (QID) | INTRAMUSCULAR | Status: DC | PRN

## 2015-08-02 MED ORDER — SUGAMMADEX SODIUM 500 MG/5ML IV SOLN
INTRAVENOUS | Status: AC
  Filled 2015-08-02: qty 5

## 2015-08-02 MED ORDER — CEFOTETAN DISODIUM-DEXTROSE 2-2.08 GM-% IV SOLR
INTRAVENOUS | Status: AC
  Filled 2015-08-02: qty 50

## 2015-08-02 MED ORDER — DEXAMETHASONE SODIUM PHOSPHATE 10 MG/ML IJ SOLN
INTRAMUSCULAR | Status: DC | PRN
  Administered 2015-08-02: 10 mg via INTRAVENOUS

## 2015-08-02 MED ORDER — LACTATED RINGERS IR SOLN
Status: DC | PRN
  Administered 2015-08-02: 1000 mL

## 2015-08-02 MED ORDER — ACETAMINOPHEN 325 MG PO TABS
650.0000 mg | ORAL_TABLET | Freq: Four times a day (QID) | ORAL | Status: DC | PRN
  Administered 2015-08-03 – 2015-08-06 (×7): 650 mg via ORAL
  Filled 2015-08-02 (×7): qty 2

## 2015-08-02 MED ORDER — FENTANYL CITRATE (PF) 250 MCG/5ML IJ SOLN
INTRAMUSCULAR | Status: AC
  Filled 2015-08-02: qty 25

## 2015-08-02 MED ORDER — 0.9 % SODIUM CHLORIDE (POUR BTL) OPTIME
TOPICAL | Status: DC | PRN
  Administered 2015-08-02: 2000 mL

## 2015-08-02 MED ORDER — MORPHINE SULFATE (PF) 10 MG/ML IV SOLN
INTRAVENOUS | Status: AC
  Filled 2015-08-02: qty 1

## 2015-08-02 MED ORDER — KCL IN DEXTROSE-NACL 20-5-0.45 MEQ/L-%-% IV SOLN
INTRAVENOUS | Status: DC
  Administered 2015-08-02 – 2015-08-04 (×5): via INTRAVENOUS
  Filled 2015-08-02 (×8): qty 1000

## 2015-08-02 MED ORDER — ONDANSETRON HCL 4 MG/2ML IJ SOLN
INTRAMUSCULAR | Status: DC | PRN
  Administered 2015-08-02 (×3): 4 mg via INTRAVENOUS

## 2015-08-02 MED ORDER — HYDROMORPHONE HCL 1 MG/ML IJ SOLN: INTRAMUSCULAR | Status: DC | PRN

## 2015-08-02 MED ORDER — MORPHINE SULFATE (PF) 2 MG/ML IV SOLN: 1.0000 mg | INTRAVENOUS | Status: DC | PRN

## 2015-08-02 MED ORDER — BUPIVACAINE-EPINEPHRINE (PF) 0.25% -1:200000 IJ SOLN
INTRAMUSCULAR | Status: AC
  Filled 2015-08-02: qty 30

## 2015-08-02 MED ORDER — SODIUM CHLORIDE 0.9 % IJ SOLN: 9.0000 mL | INTRAMUSCULAR | Status: DC | PRN

## 2015-08-02 MED ORDER — CLINDAMYCIN PHOSPHATE 900 MG/50ML IV SOLN
INTRAVENOUS | Status: AC
  Filled 2015-08-02: qty 50

## 2015-08-02 MED ORDER — ONDANSETRON HCL 4 MG/2ML IJ SOLN
INTRAMUSCULAR | Status: AC
  Filled 2015-08-02: qty 2

## 2015-08-02 MED ORDER — LIDOCAINE HCL (PF) 1 % IJ SOLN
INTRAMUSCULAR | Status: DC | PRN
  Administered 2015-08-02: 10 mL

## 2015-08-02 MED ORDER — CLINDAMYCIN PHOSPHATE 900 MG/50ML IV SOLN
900.0000 mg | Freq: Once | INTRAVENOUS | Status: DC
  Filled 2015-08-02: qty 50

## 2015-08-02 MED ORDER — DIPHENHYDRAMINE HCL 12.5 MG/5ML PO ELIX: 12.5000 mg | ORAL_SOLUTION | Freq: Four times a day (QID) | ORAL | Status: DC | PRN

## 2015-08-02 MED ORDER — PROPOFOL 10 MG/ML IV BOLUS
INTRAVENOUS | Status: AC
  Filled 2015-08-02: qty 20

## 2015-08-02 MED ORDER — SUCCINYLCHOLINE CHLORIDE 20 MG/ML IJ SOLN
INTRAMUSCULAR | Status: DC | PRN
  Administered 2015-08-02: 100 mg via INTRAVENOUS

## 2015-08-02 MED ORDER — LIDOCAINE HCL 1 % IJ SOLN
INTRAMUSCULAR | Status: AC
  Filled 2015-08-02: qty 20

## 2015-08-02 MED ORDER — FENTANYL CITRATE (PF) 100 MCG/2ML IJ SOLN
INTRAMUSCULAR | Status: DC | PRN
  Administered 2015-08-02 (×5): 50 ug via INTRAVENOUS

## 2015-08-02 MED ORDER — METOPROLOL TARTRATE 1 MG/ML IV SOLN
5.0000 mg | Freq: Four times a day (QID) | INTRAVENOUS | Status: DC
  Administered 2015-08-03 (×2): 5 mg via INTRAVENOUS
  Filled 2015-08-02 (×11): qty 5

## 2015-08-02 MED ORDER — FENTANYL CITRATE (PF) 100 MCG/2ML IJ SOLN: 25.0000 ug | INTRAMUSCULAR | Status: DC | PRN

## 2015-08-02 MED ORDER — MORPHINE SULFATE 2 MG/ML IV SOLN
INTRAVENOUS | Status: AC
  Filled 2015-08-02: qty 25

## 2015-08-02 MED ORDER — PROPOFOL 10 MG/ML IV BOLUS
INTRAVENOUS | Status: DC | PRN
  Administered 2015-08-02: 150 mg via INTRAVENOUS

## 2015-08-02 MED ORDER — ALVIMOPAN 12 MG PO CAPS
12.0000 mg | ORAL_CAPSULE | Freq: Once | ORAL | Status: AC
  Administered 2015-08-02: 12 mg via ORAL
  Filled 2015-08-02: qty 1

## 2015-08-02 MED ORDER — ONDANSETRON HCL 4 MG PO TABS: 4.0000 mg | ORAL_TABLET | Freq: Four times a day (QID) | ORAL | Status: DC | PRN

## 2015-08-02 MED ORDER — LIDOCAINE HCL (CARDIAC) 20 MG/ML IV SOLN
INTRAVENOUS | Status: DC | PRN
Start: 2015-08-02 — End: 2015-08-02
  Administered 2015-08-02: 100 mg via INTRAVENOUS

## 2015-08-02 MED ORDER — HYDROMORPHONE HCL 1 MG/ML IJ SOLN
INTRAMUSCULAR | Status: DC | PRN
  Administered 2015-08-02 (×3): 0.5 mg via INTRAVENOUS

## 2015-08-02 MED ORDER — HEPARIN SODIUM (PORCINE) 5000 UNIT/ML IJ SOLN
5000.0000 [IU] | Freq: Three times a day (TID) | INTRAMUSCULAR | Status: DC
  Administered 2015-08-03 – 2015-08-06 (×10): 5000 [IU] via SUBCUTANEOUS
  Filled 2015-08-02 (×13): qty 1

## 2015-08-02 MED ORDER — ROCURONIUM BROMIDE 100 MG/10ML IV SOLN
INTRAVENOUS | Status: DC | PRN
  Administered 2015-08-02: 40 mg via INTRAVENOUS

## 2015-08-02 MED ORDER — SUGAMMADEX SODIUM 200 MG/2ML IV SOLN
INTRAVENOUS | Status: DC | PRN
  Administered 2015-08-02: 200 mg via INTRAVENOUS

## 2015-08-02 MED ORDER — MORPHINE SULFATE 2 MG/ML IV SOLN
INTRAVENOUS | Status: DC
  Administered 2015-08-02: 1 mg via INTRAVENOUS
  Administered 2015-08-02: 18:00:00 via INTRAVENOUS
  Administered 2015-08-03: 7 mg via INTRAVENOUS
  Administered 2015-08-03: 4 mg via INTRAVENOUS
  Administered 2015-08-03: 2 mg via INTRAVENOUS
  Administered 2015-08-04: 5 mg via INTRAVENOUS
  Administered 2015-08-04: 0 mg via INTRAVENOUS
  Administered 2015-08-04: 7 mg via INTRAVENOUS
  Administered 2015-08-04: 1 mg via INTRAVENOUS
  Administered 2015-08-04: 5 mg via INTRAVENOUS
  Administered 2015-08-05 (×2): 0 mg via INTRAVENOUS

## 2015-08-02 MED ORDER — GENTAMICIN SULFATE 40 MG/ML IJ SOLN
380.0000 mg | Freq: Once | INTRAVENOUS | Status: DC
  Filled 2015-08-02: qty 9.5

## 2015-08-02 MED ORDER — ALVIMOPAN 12 MG PO CAPS
12.0000 mg | ORAL_CAPSULE | Freq: Two times a day (BID) | ORAL | Status: DC
  Administered 2015-08-03 (×2): 12 mg via ORAL
  Filled 2015-08-02 (×4): qty 1

## 2015-08-02 MED ORDER — FENTANYL CITRATE (PF) 100 MCG/2ML IJ SOLN
INTRAMUSCULAR | Status: AC
  Filled 2015-08-02: qty 2

## 2015-08-02 MED ORDER — LACTATED RINGERS IV SOLN
INTRAVENOUS | Status: DC
  Administered 2015-08-02 (×3): via INTRAVENOUS
  Administered 2015-08-02: 1000 mL via INTRAVENOUS

## 2015-08-02 MED ORDER — DEXAMETHASONE SODIUM PHOSPHATE 10 MG/ML IJ SOLN
INTRAMUSCULAR | Status: AC
  Filled 2015-08-02: qty 1

## 2015-08-02 MED ORDER — BUPIVACAINE-EPINEPHRINE 0.25% -1:200000 IJ SOLN
INTRAMUSCULAR | Status: DC | PRN
  Administered 2015-08-02: 10 mL

## 2015-08-02 SURGICAL SUPPLY — 109 items
APL SKNCLS STERI-STRIP NONHPOA (GAUZE/BANDAGES/DRESSINGS) ×1
APPLIER CLIP 5 13 M/L LIGAMAX5 (MISCELLANEOUS)
APPLIER CLIP ROT 10 11.4 M/L (STAPLE)
APR CLP MED LRG 11.4X10 (STAPLE)
APR CLP MED LRG 5 ANG JAW (MISCELLANEOUS)
BAG URINE DRAINAGE (UROLOGICAL SUPPLIES) ×1 IMPLANT
BENZOIN TINCTURE PRP APPL 2/3 (GAUZE/BANDAGES/DRESSINGS) ×1 IMPLANT
BLADE EXTENDED COATED 6.5IN (ELECTRODE) ×2 IMPLANT
BLADE HEX COATED 2.75 (ELECTRODE) ×2 IMPLANT
CABLE HIGH FREQUENCY MONO STRZ (ELECTRODE) ×1 IMPLANT
CANNULA REDUC XI 12-8 STAPL (CANNULA) ×1
CANNULA REDUCER 12-8 DVNC XI (CANNULA) IMPLANT
CATH FOLEY 2WAY SLVR 30CC 24FR (CATHETERS) ×1 IMPLANT
CATH KIT ON-Q SILVERSOAK 7.5 (CATHETERS) IMPLANT
CATH KIT ON-Q SILVERSOAK 7.5IN (CATHETERS) IMPLANT
CATH ROBINSON RED A/P 16FR (CATHETERS) IMPLANT
CELLS DAT CNTRL 66122 CELL SVR (MISCELLANEOUS) IMPLANT
CLIP APPLIE 5 13 M/L LIGAMAX5 (MISCELLANEOUS) IMPLANT
CLIP APPLIE ROT 10 11.4 M/L (STAPLE) IMPLANT
CLIP LIGATING HEM O LOK PURPLE (MISCELLANEOUS) ×1 IMPLANT
CLIP LIGATING HEMO O LOK GREEN (MISCELLANEOUS) IMPLANT
CLIP LIGATING HEMOLOK MED (MISCELLANEOUS) IMPLANT
COVER SURGICAL LIGHT HANDLE (MISCELLANEOUS) ×1 IMPLANT
COVER TIP SHEARS 8 DVNC (MISCELLANEOUS) ×1 IMPLANT
COVER TIP SHEARS 8MM DA VINCI (MISCELLANEOUS) ×1
DECANTER SPIKE VIAL GLASS SM (MISCELLANEOUS) ×4 IMPLANT
DEVICE TROCAR PUNCTURE CLOSURE (ENDOMECHANICALS) ×1 IMPLANT
DRAIN CHANNEL 19F RND (DRAIN) IMPLANT
DRAPE ARM DVNC X/XI (DISPOSABLE) ×4 IMPLANT
DRAPE COLUMN DVNC XI (DISPOSABLE) ×1 IMPLANT
DRAPE DA VINCI XI ARM (DISPOSABLE) ×4
DRAPE DA VINCI XI COLUMN (DISPOSABLE) ×1
DRAPE SURG IRRIG POUCH 19X23 (DRAPES) ×2 IMPLANT
DRSG OPSITE POSTOP 4X10 (GAUZE/BANDAGES/DRESSINGS) IMPLANT
DRSG OPSITE POSTOP 4X6 (GAUZE/BANDAGES/DRESSINGS) ×1 IMPLANT
DRSG OPSITE POSTOP 4X8 (GAUZE/BANDAGES/DRESSINGS) IMPLANT
DRSG TEGADERM 2-3/8X2-3/4 SM (GAUZE/BANDAGES/DRESSINGS) ×5 IMPLANT
DRSG TEGADERM 4X4.75 (GAUZE/BANDAGES/DRESSINGS) ×2 IMPLANT
DRSG TEGADERM 6X8 (GAUZE/BANDAGES/DRESSINGS) ×1 IMPLANT
ELECT REM PT RETURN 9FT ADLT (ELECTROSURGICAL) ×2
ELECTRODE REM PT RTRN 9FT ADLT (ELECTROSURGICAL) ×1 IMPLANT
ENDOLOOP SUT PDS II  0 18 (SUTURE)
ENDOLOOP SUT PDS II 0 18 (SUTURE) IMPLANT
GAUZE SPONGE 2X2 8PLY STRL LF (GAUZE/BANDAGES/DRESSINGS) IMPLANT
GAUZE SPONGE 4X4 12PLY STRL (GAUZE/BANDAGES/DRESSINGS) IMPLANT
GLOVE BIO SURGEON STRL SZ 6 (GLOVE) ×6 IMPLANT
GLOVE INDICATOR 6.5 STRL GRN (GLOVE) ×6 IMPLANT
GOWN STRL REUS W/TWL 2XL LVL3 (GOWN DISPOSABLE) ×6 IMPLANT
GOWN STRL REUS W/TWL XL LVL3 (GOWN DISPOSABLE) ×11 IMPLANT
LEGGING LITHOTOMY PAIR STRL (DRAPES) ×2 IMPLANT
LUBRICANT JELLY K Y 4OZ (MISCELLANEOUS) ×2 IMPLANT
NDL INSUFFLATION 14GA 120MM (NEEDLE) ×1 IMPLANT
NEEDLE INSUFFLATION 14GA 120MM (NEEDLE) ×2 IMPLANT
PACK CARDIOVASCULAR III (CUSTOM PROCEDURE TRAY) ×2 IMPLANT
PACK COLON (CUSTOM PROCEDURE TRAY) ×2 IMPLANT
PACK GENERAL/GYN (CUSTOM PROCEDURE TRAY) ×1 IMPLANT
PAD POSITIONER PINK NONSTERILE (MISCELLANEOUS) ×1 IMPLANT
PORT LAP GEL ALEXIS MED 5-9CM (MISCELLANEOUS) ×1 IMPLANT
PUMP PAIN ON-Q (MISCELLANEOUS) IMPLANT
RELOAD STAPLE 45 BLU REG DVNC (STAPLE) IMPLANT
RETRACTOR WND ALEXIS 18 MED (MISCELLANEOUS) IMPLANT
RTRCTR WOUND ALEXIS 18CM MED (MISCELLANEOUS)
SCISSORS LAP 5X35 DISP (ENDOMECHANICALS) ×1 IMPLANT
SCRUB PCMX 4 OZ (MISCELLANEOUS) ×1 IMPLANT
SEAL CANN UNIV 5-8 DVNC XI (MISCELLANEOUS) ×3 IMPLANT
SEAL XI 5MM-8MM UNIVERSAL (MISCELLANEOUS) ×3
SEALER TISSUE G2 STRG ARTC 35C (ENDOMECHANICALS) IMPLANT
SEALER VESSEL DA VINCI XI (MISCELLANEOUS) ×1
SEALER VESSEL EXT DVNC XI (MISCELLANEOUS) IMPLANT
SET IRRIG TUBING LAPAROSCOPIC (IRRIGATION / IRRIGATOR) ×2 IMPLANT
SLEEVE XCEL OPT CAN 5 100 (ENDOMECHANICALS) IMPLANT
SOLUTION ELECTROLUBE (MISCELLANEOUS) ×2 IMPLANT
SPONGE GAUZE 2X2 STER 10/PKG (GAUZE/BANDAGES/DRESSINGS) ×1
STAPLER 45 BLU RELOAD XI (STAPLE) ×2 IMPLANT
STAPLER 45 BLUE RELOAD XI (STAPLE) ×2
STAPLER CIRC CVD 29MM 37CM (STAPLE) ×1 IMPLANT
STAPLER SHEATH (SHEATH) ×1
STAPLER SHEATH ENDOWRIST DVNC (SHEATH) IMPLANT
STRIP CLOSURE SKIN 1/2X4 (GAUZE/BANDAGES/DRESSINGS) ×1 IMPLANT
SUT MNCRL AB 4-0 PS2 18 (SUTURE) ×3 IMPLANT
SUT PDS AB 1 CTX 36 (SUTURE) ×4 IMPLANT
SUT PDS AB 1 TP1 96 (SUTURE) IMPLANT
SUT PDS AB 2-0 CT2 27 (SUTURE) IMPLANT
SUT PROLENE 0 CT 2 (SUTURE) ×1 IMPLANT
SUT SILK 2 0 (SUTURE)
SUT SILK 2 0 SH CR/8 (SUTURE) ×1 IMPLANT
SUT SILK 2-0 18XBRD TIE 12 (SUTURE) ×1 IMPLANT
SUT SILK 3 0 (SUTURE)
SUT SILK 3 0 SH CR/8 (SUTURE) ×1 IMPLANT
SUT SILK 3-0 18XBRD TIE 12 (SUTURE) ×1 IMPLANT
SUT V-LOC BARB 180 2/0GR6 GS22 (SUTURE)
SUT VIC AB 3-0 SH 18 (SUTURE) IMPLANT
SUT VIC AB 3-0 SH 27 (SUTURE)
SUT VIC AB 3-0 SH 27XBRD (SUTURE) IMPLANT
SUT VIC AB 3-0 SH 8-18 (SUTURE) ×1 IMPLANT
SUT VICRYL 0 TIES 12 18 (SUTURE) ×1 IMPLANT
SUT VICRYL 0 UR6 27IN ABS (SUTURE) ×1 IMPLANT
SUT VLOC 180 2-0 9IN GS21 (SUTURE) IMPLANT
SUTURE V-LC BRB 180 2/0GR6GS22 (SUTURE) IMPLANT
SYRINGE 10CC LL (SYRINGE) ×4 IMPLANT
SYS LAPSCP GELPORT 120MM (MISCELLANEOUS)
SYSTEM LAPSCP GELPORT 120MM (MISCELLANEOUS) IMPLANT
TOWEL OR NON WOVEN STRL DISP B (DISPOSABLE) ×3 IMPLANT
TRAY FOLEY W/METER SILVER 14FR (SET/KITS/TRAYS/PACK) ×1 IMPLANT
TRAY FOLEY W/METER SILVER 16FR (SET/KITS/TRAYS/PACK) ×2 IMPLANT
TROCAR BLADELESS OPT 5 100 (ENDOMECHANICALS) ×2 IMPLANT
TUBING CONNECTING 10 (TUBING) IMPLANT
TUBING FILTER THERMOFLATOR (ELECTROSURGICAL) ×2 IMPLANT
TUNNELER SHEATH ON-Q 16GX12 DP (PAIN MANAGEMENT) IMPLANT

## 2015-08-02 NOTE — Anesthesia Preprocedure Evaluation (Addendum)
Anesthesia Evaluation  Patient identified by MRN, date of birth, ID band Patient awake    Reviewed: Allergy & Precautions, NPO status , Patient's Chart, lab work & pertinent test results  Airway Mallampati: II  TM Distance: >3 FB Neck ROM: Full    Dental no notable dental hx.    Pulmonary pneumonia, former smoker,    Pulmonary exam normal breath sounds clear to auscultation       Cardiovascular hypertension, Normal cardiovascular exam Rhythm:Regular Rate:Normal     Neuro/Psych negative neurological ROS  negative psych ROS   GI/Hepatic Neg liver ROS, GERD  Medicated,  Endo/Other  negative endocrine ROS  Renal/GU Renal diseaseChronic kidney disease, stage 2  negative genitourinary   Musculoskeletal negative musculoskeletal ROS (+)   Abdominal   Peds negative pediatric ROS (+)  Hematology negative hematology ROS (+)   Anesthesia Other Findings   Reproductive/Obstetrics negative OB ROS                            Anesthesia Physical Anesthesia Plan  ASA: II  Anesthesia Plan: General   Post-op Pain Management:    Induction: Intravenous  Airway Management Planned: Oral ETT  Additional Equipment:   Intra-op Plan:   Post-operative Plan: Extubation in OR  Informed Consent: I have reviewed the patients History and Physical, chart, labs and discussed the procedure including the risks, benefits and alternatives for the proposed anesthesia with the patient or authorized representative who has indicated his/her understanding and acceptance.   Dental advisory given  Plan Discussed with: CRNA  Anesthesia Plan Comments:         Anesthesia Quick Evaluation

## 2015-08-02 NOTE — Anesthesia Procedure Notes (Signed)
Procedure Name: Intubation Date/Time: 08/02/2015 12:20 PM Performed by: Lind Covert Pre-anesthesia Checklist: Patient identified, Timeout performed, Emergency Drugs available, Suction available and Patient being monitored Patient Re-evaluated:Patient Re-evaluated prior to inductionOxygen Delivery Method: Circle system utilized Preoxygenation: Pre-oxygenation with 100% oxygen Intubation Type: IV induction Laryngoscope Size: Mac and 4 Grade View: Grade I Tube type: Oral Number of attempts: 1 Airway Equipment and Method: Stylet Placement Confirmation: ETT inserted through vocal cords under direct vision,  breath sounds checked- equal and bilateral and positive ETCO2 Secured at: 23 cm Tube secured with: Tape Dental Injury: Teeth and Oropharynx as per pre-operative assessment

## 2015-08-02 NOTE — Interval H&P Note (Signed)
History and Physical Interval Note:  08/02/2015 12:03 PM  Keith [name]  has presented today for surgery, with the diagnosis of RECTOSIGMOID CANCER  The various methods of treatment have been discussed with the patient and family. After consideration of risks, benefits and other options for treatment, the patient has consented to  Procedure(s): XI ROBOTIC ASSISTED LOWER ANTERIOR RESECTION (N/A) as a surgical intervention .  The patient's history has been reviewed, patient examined, no change in status, stable for surgery.  I have reviewed the patient's chart and labs.  Questions were answered to the patient's satisfaction.     Sun Wilensky

## 2015-08-02 NOTE — Consult Note (Signed)
WOC ostomy consult note Patient seen for preoperative stoma site selection per Dr. Marlowe Aschoff request.  Two sites are requested, RUQ and LLQ. The patient is assessed in the lying, sitting and standing positions. The abdominal rectus muscle is easily palpated.  Patient and his family understand and reiterate to me that the intended outcome of the surgery is to emerge without a stoma; they are aware that if one is needed intraoperatively, the first choice would be to create a temporary stoma and lastly a permanent one.   The RUQ marking is made 7cm to the right of the umbilicus and 2cm above.   The LLQ marking is made 7cm to the left of the umbilicus and in line with midline as the patient has an adipose roll distally and wears the belt of his pants in that crease. Lower marks in teh LLQ are likely to be problematic for pouching. Luetta Nutting are made with a surgical site marking pen and are not covered with a thin film dressing as the patient is ready to OR at this time and has already had the CHG bath. [place] nursing team will not follow, but will remain available to this patient, the nursing, surgical and medical teams.  Please re-consult if a stoma is made intraoperatively. Thanks, Maudie Flakes, MSN, RN, [place], [place], [place] [phone])

## 2015-08-02 NOTE — Transfer of Care (Signed)
Immediate Anesthesia Transfer of Care Note  Patient: Keith [name]  Procedure(s) Performed: Procedure(s): XI ROBOTIC ASSISTED LOWER ANTERIOR RESECTION (N/A)  Patient Location: PACU  Anesthesia Type:General  Level of Consciousness: awake, alert  and patient cooperative  Airway & Oxygen Therapy: Patient Spontanous Breathing and Patient connected to face mask oxygen  Post-op Assessment: Report given to RN, Post -op Vital signs reviewed and stable and Patient moving all extremities X 4  Post vital signs: stable  Last Vitals:  Filed Vitals:   08/02/15 1001  BP: 162/71  Pulse: 55  Temp: 36.3 C  Resp: 18    Complications: No apparent anesthesia complications

## 2015-08-02 NOTE — Op Note (Signed)
Robotic Low anterior resection  Indications: This patient presents for a robotic low anterior resection for rectosigmoid cancer (measuring at 20 cm by scope)  Pre-operative Diagnosis: carcinoma rectosigmoid colon   Post-operative Diagnosis: Same  Surgeon: Stark Klein   Assistants: Gurney Maxin, MD  Anesthesia: General endotracheal anesthesia and Local anesthesia 1% plain lidocaine, 0.25.% bupivacaine, with epinephrine  ASA Class: 2  Procedure Details  The patient was seen in the Holding Room. The risks, benefits, complications, treatment options, and expected outcomes were discussed with the patient. The possibilities of reaction to medication, perforation of viscus, bleeding, recurrent infection, finding a normal colon, the need for additional procedures, failure to diagnose a condition, and creating a complication requiring transfusion or operation were discussed with the patient. The patient was advised of the risk of ostomy.  The patient concurred with the proposed plan, giving informed consent.   The patient was taken to the operating room, identified, and the procedure verified as robotic low anterior resection. A Time Out was held and the above information confirmed.  The patient was brought to the operating room and placed into the low lithotomy position.  After induction of a general anesthetic, a Foley catheter was inserted and the abdomen was prepped and draped in standard fashion. Local anesthetic was infiltrated above the umbilicus. The skin was incised with a #11 blade.    The fascia was elevated and a Veress needle was used to access and insufflate the abdomen.  The fascia was continued to be elevated while a trocar was introduced into the abdomen.  Pneumoperitoneum was insufflated to a pressure of 15 mm Hg. The robotic scope/camera was introduced.    Exploration revealed a normal omentum, colon, small bowel, peritoneum, liver, and stomach. Additional robotic trocars were then  placed after anesthetizing the skin and peritoneum with local.  A 12 mm trocar was placed in the RLQ.  A 5 mm port was placed in the RUQ. The other 3 were 8 mm robotic ports.. The descending colon and splenic flexure were then mobilized with gentle retraction of the colon in a medial direction with mobilization of the peritoneal reflection with sharp scissors. Mobilization of this area was complete to expose the retroperitoneum. The left ureter was identified during this mobilization process but no structures were divided during this mobilization. There was no blood loss during this portion of the procedure.      The mobilization continued to take down the sigmoid from the abdominal wall.  The right ureter was located as well.  The pedicle was divided high up for the sigmoid.  The mesorectum was identified and taken completely, avoiding the hypogastric plexus.  The vessel sealer was used to divide the pedicle and the lateral rectal attachments.  The lesion was identified, and the rectum was mobilized below the peritoneal reflection.  The rectal wall was cleaned off and two loads of the robotic stapler were used to divide the rectum.  A pfannenstiel incision was created approximately 4-5 cm in length to remove the specimen.  A wound protector was placed into the wound.  The colon was pulled out via the incision.  A bowel clamp was placed proximally to the site of division.  The colon was divided with the cautery.  The sizers were used to determine that the 29 mm EEA was the best size.  The anvil was placed in the end of the descending colon.  A 0-0 prolene purse string was used to approximate the tissues  The end of the  colon was placed back into the abdomen, and the cap was placed on the wound protector.  The abdomen was reinsufflated, and an end to end anastamosis was created.   I went to the rectal position and advanced the stapler through the anus.  The anvil was coupled with the stapler laparoscopically.   The adjacent tissues were held out of the way while the stapler was closed.  Pressure was held, then the stapler was fired.  The stapler was gently removed, and the anastamotic rings were intact.  The proctoscope was used to examine the staple line, and no bleeding was seen.  The rectum was insufflated, and there were no bubbles seen coming up into the irrigant in the abdomen.      Hemostasis was confirmed. The bowel anastomosis was returned and the incision was then closed with a running #1 Prolene suture. The pfannenstiel incision was closed with layered 3-0 vicryls and 4-0 monocryl.  The 12-mm trocar incisions were closed with a 0 Vicryl suture. The soft tissue was irrigated and the incisions were closed with 4-0 monocryl subcuticular closure. Steri-Strips were applied.  Instrument, sponge, and needle counts were correct prior to abdominal closure and at the conclusion of the case.   Findings: tumor ABOVE peritoneal reflection, but anastamosis at 11 cm below peritoneal reflection.   Estimated Blood Loss: less than 100 mL         Drains: none          Specimens: rectosigmoid colon, anastamotic rings            Complications: None; patient tolerated the procedure well.         Disposition: PACU - hemodynamically stable.         Condition: stable

## 2015-08-03 LAB — CBC
HEMATOCRIT: 39.4 % (ref 39.0–52.0)
Hemoglobin: 13.1 g/dL (ref 13.0–17.0)
MCH: 29.9 pg (ref 26.0–34.0)
MCHC: 33.2 g/dL (ref 30.0–36.0)
MCV: 90 fL (ref 78.0–100.0)
PLATELETS: 159 10*3/uL (ref 150–400)
RBC: 4.38 MIL/uL (ref 4.22–5.81)
RDW: 13.2 % (ref 11.5–15.5)
WBC: 12.9 10*3/uL — ABNORMAL HIGH (ref 4.0–10.5)

## 2015-08-03 LAB — BASIC METABOLIC PANEL
Anion gap: 6 (ref 5–15)
BUN: 11 mg/dL (ref 6–20)
CALCIUM: 8.7 mg/dL — AB (ref 8.9–10.3)
CO2: 26 mmol/L (ref 22–32)
CREATININE: 0.74 mg/dL (ref 0.61–1.24)
Chloride: 106 mmol/L (ref 101–111)
GFR calc non Af Amer: >60 mL/min (ref >60)
Glucose, Bld: 166 mg/dL — ABNORMAL HIGH (ref 65–99)
Potassium: 4.3 mmol/L (ref 3.5–5.1)
SODIUM: 138 mmol/L (ref 135–145)

## 2015-08-03 MED ORDER — MENTHOL 3 MG MT LOZG
1.0000 | LOZENGE | OROMUCOSAL | Status: DC | PRN
  Filled 2015-08-03: qty 9

## 2015-08-03 NOTE — Care Management Note (Signed)
Case Management Note  Patient Details  Name: Keith [name] MRN: [id number] Date of Birth: 1932/07/17  Subjective/Objective:        S/p robotic low anterior resection for rectosigmoid cancer           Action/Plan: Discharge planning  Expected Discharge Date:                  Expected Discharge Plan:  Home/Self Care  In-House Referral:  NA  Discharge planning Services  CM Consult  Post Acute Care Choice:  NA Choice offered to:  NA  DME Arranged:  N/A DME Agency:  NA  HH Arranged:  NA HH Agency:  NA  Status of Service:  Completed, signed off  Medicare Important Message Given:    Date Medicare IM Given:    Medicare IM give by:    Date Additional Medicare IM Given:    Additional Medicare Important Message give by:     If discussed at [place] of Stay Meetings, dates discussed:    Additional Comments:  Guadalupe Maple, RN 08/03/2015, 12:48 PM

## 2015-08-03 NOTE — Progress Notes (Signed)
1 Day Post-Op  Subjective: Doing well.  No n/v.  Walked this AM already.  Pain controlled.   Objective: Vital signs in last 24 hours: Temp:  [97.3 F (36.3 C)-98.9 F (37.2 C)] 98.9 F (37.2 C) (10/26 0730) Pulse Rate:  [55-96] 63 (10/26 0730) Resp:  [11-18] 14 (10/26 0730) BP: (106-162)/(57-78) 119/57 mmHg (10/26 0730) SpO2:  [95 %-100 %] 95 % (10/26 0730) Weight:  [74.39 kg (164 lb)] 74.39 kg (164 lb) (10/25 1003) Last BM Date: 08/01/15  Intake/Output from previous day: 10/25 0701 - 10/26 0700 In: 5606.7 [P.O.:1080; I.V.:4526.7] Out: 2210 [Urine:2110; Blood:100] Intake/Output this shift:    General appearance: alert, cooperative and no distress Resp: breathing comfortably Cardio: regular rate and rhythm GI: soft, approp tender.  mildly distended.    Lab Results:   Recent Labs  08/02/15 1920 08/03/15 0612  WBC 11.8* 12.9*  HGB 13.9 13.1  HCT 40.7 39.4  PLT 132* 159   BMET  Recent Labs  08/02/15 1920 08/03/15 0612  NA  --  138  K  --  4.3  CL  --  106  CO2  --  26  GLUCOSE  --  166*  BUN  --  11  CREATININE 0.87 0.74  CALCIUM  --  8.7*   PT/INR No results for input(s): LABPROT, INR in the last 72 hours. ABG No results for input(s): PHART, HCO3 in the last 72 hours.  Invalid input(s): PCO2, PO2  Studies/Results: No results found.  Anti-infectives: Anti-infectives    Start     Dose/Rate Route Frequency Ordered Stop   08/02/15 1200  gentamicin (GARAMYCIN) 380 mg in dextrose 5 % 100 mL IVPB     380 mg 109.5 mL/hr over 60 Minutes Intravenous  Once 08/02/15 1137     08/02/15 1145  clindamycin (CLEOCIN) IVPB 900 mg     900 mg 100 mL/hr over 30 Minutes Intravenous  Once 08/02/15 1135     08/02/15 1052  cefoTEtan (CEFOTAN) 2 g in dextrose 5 % 50 mL IVPB  Status:  Discontinued     2 g 100 mL/hr over 30 Minutes Intravenous 30 min pre-op 08/02/15 1052 08/02/15 1132      Assessment/Plan: s/p Procedure(s): XI ROBOTIC ASSISTED LOWER ANTERIOR  RESECTION (N/A) PAS Continue foley due to urinary output monitoring decrease IVF somewhat  PCA Ambulate, IS Entereg for return of bowel function   LOS: 1 day    [facility] 08/03/2015

## 2015-08-04 ENCOUNTER — Encounter: Payer: Self-pay | Admitting: *Deleted

## 2015-08-04 LAB — BASIC METABOLIC PANEL
Anion gap: 4 — ABNORMAL LOW (ref 5–15)
BUN: 9 mg/dL (ref 6–20)
CHLORIDE: 105 mmol/L (ref 101–111)
CO2: 28 mmol/L (ref 22–32)
CREATININE: 0.66 mg/dL (ref 0.61–1.24)
Calcium: 8.4 mg/dL — ABNORMAL LOW (ref 8.9–10.3)
GFR calc non Af Amer: >60 mL/min (ref >60)
Glucose, Bld: 113 mg/dL — ABNORMAL HIGH (ref 65–99)
POTASSIUM: 4.9 mmol/L (ref 3.5–5.1)
Sodium: 137 mmol/L (ref 135–145)

## 2015-08-04 LAB — CBC
HCT: 36.4 % — ABNORMAL LOW (ref 39.0–52.0)
Hemoglobin: 12.1 g/dL — ABNORMAL LOW (ref 13.0–17.0)
MCH: 29.9 pg (ref 26.0–34.0)
MCHC: 33.2 g/dL (ref 30.0–36.0)
MCV: 89.9 fL (ref 78.0–100.0)
Platelets: 125 10*3/uL — ABNORMAL LOW (ref 150–400)
RBC: 4.05 MIL/uL — ABNORMAL LOW (ref 4.22–5.81)
RDW: 13.3 % (ref 11.5–15.5)
WBC: 12.6 10*3/uL — AB (ref 4.0–10.5)

## 2015-08-04 NOTE — Progress Notes (Signed)
Oncology Nurse Navigator Documentation  Oncology Nurse Navigator Flowsheets 08/04/2015  Referral date to RadOnc/MedOnc -  Navigator Encounter Type Surgical F/U  Patient Visit Type Inpatient  Treatment Phase S/P LAR 10/25  Barriers/Navigation Needs No barriers at this time  Education Other-reminded of frequent ambulation, deep breathing and coughing  Interventions None required  Education Method Verbal  Support Groups/Services -  Time Spent with Patient 15  Brief visit after surgery. Doing well. Invited patient to call with any navigation needs. He is aware of his follow up with Dr. Benay Spice on 08/16/15.

## 2015-08-04 NOTE — Progress Notes (Signed)
Patient ID: Keith [name], male   DOB: 12-13-1931, 81 y.o.   MRN: [id number] 2 Days Post-Op  Subjective: Walked more, no n/v.  Using PCA.  Passing a small amt of flatus, no BM yet.  Objective: Vital signs in last 24 hours: Temp:  [97.4 F (36.3 C)-98.9 F (37.2 C)] 98.2 F (36.8 C) (10/27 0603) Pulse Rate:  [49-66] 51 (10/27 0603) Resp:  [12-20] 13 (10/27 0851) BP: (107-139)/(51-69) 139/69 mmHg (10/27 0603) SpO2:  [92 %-99 %] 94 % (10/27 0851) FiO2 (%):  [30 %-36 %] 30 % (10/27 0532) Last BM Date: 08/02/15  Intake/Output from previous day: 10/26 0701 - 10/27 0700 In: 3000 [P.O.:600; I.V.:2400] Out: 5750 [Urine:5750] Intake/Output this shift:    General appearance: alert, cooperative and no distress Resp: breathing comfortably Cardio: regular rate and rhythm GI: soft, approp tender.  mildly distended.  no erythema on lower incision.    Lab Results:   Recent Labs  08/03/15 0612 08/04/15 0425  WBC 12.9* 12.6*  HGB 13.1 12.1*  HCT 39.4 36.4*  PLT 159 125*   BMET  Recent Labs  08/03/15 0612 08/04/15 0425  NA 138 137  K 4.3 4.9  CL 106 105  CO2 26 28  GLUCOSE 166* 113*  BUN 11 9  CREATININE 0.74 0.66  CALCIUM 8.7* 8.4*   PT/INR No results for input(s): LABPROT, INR in the last 72 hours. ABG No results for input(s): PHART, HCO3 in the last 72 hours.  Invalid input(s): PCO2, PO2  Studies/Results: No results found.  Anti-infectives: Anti-infectives    Start     Dose/Rate Route Frequency Ordered Stop   08/02/15 1200  gentamicin (GARAMYCIN) 380 mg in dextrose 5 % 100 mL IVPB     380 mg 109.5 mL/hr over 60 Minutes Intravenous  Once 08/02/15 1137     08/02/15 1145  clindamycin (CLEOCIN) IVPB 900 mg     900 mg 100 mL/hr over 30 Minutes Intravenous  Once 08/02/15 1135     08/02/15 1052  cefoTEtan (CEFOTAN) 2 g in dextrose 5 % 50 mL IVPB  Status:  Discontinued     2 g 100 mL/hr over 30 Minutes Intravenous 30 min pre-op 08/02/15 1052 08/02/15 1132       Assessment/Plan: s/p Procedure(s): XI ROBOTIC ASSISTED LOWER ANTERIOR RESECTION (N/A) PAS hose, SQ heparin for VTE prophylaxis Advance diet D/c foley PCA Ambulate, IS Entereg for return of bowel function   LOS: 2 days    Ariani Seier 08/04/2015

## 2015-08-04 NOTE — Progress Notes (Signed)
Pharmacy Brief Note - Alvimopan (Entereg)  The standing order set for alvimopan (Entereg) now includes an automatic order to discontinue the drug after the patient has had a bowel movement. The change was approved by the [place] and the Medical Executive Committee.   This patient has had bowel movements documented by nursing. Therefore, alvimopan has been discontinued. If there are questions, please contact the pharmacy at [phone].   Thank you-  Dia Sitter, PharmD, BCPS 08/04/2015 12:27 PM

## 2015-08-05 LAB — CBC
HCT: 38.4 % — ABNORMAL LOW (ref 39.0–52.0)
HEMOGLOBIN: 12.7 g/dL — AB (ref 13.0–17.0)
MCH: 30 pg (ref 26.0–34.0)
MCHC: 33.1 g/dL (ref 30.0–36.0)
MCV: 90.6 fL (ref 78.0–100.0)
Platelets: 131 10*3/uL — ABNORMAL LOW (ref 150–400)
RBC: 4.24 MIL/uL (ref 4.22–5.81)
RDW: 13.2 % (ref 11.5–15.5)
WBC: 6.8 10*3/uL (ref 4.0–10.5)

## 2015-08-05 LAB — BASIC METABOLIC PANEL
ANION GAP: 4 — AB (ref 5–15)
BUN: 8 mg/dL (ref 6–20)
CHLORIDE: 106 mmol/L (ref 101–111)
CO2: 30 mmol/L (ref 22–32)
CREATININE: 0.77 mg/dL (ref 0.61–1.24)
Calcium: 8.7 mg/dL — ABNORMAL LOW (ref 8.9–10.3)
GFR calc non Af Amer: >60 mL/min (ref >60)
Glucose, Bld: 115 mg/dL — ABNORMAL HIGH (ref 65–99)
Potassium: 4.1 mmol/L (ref 3.5–5.1)
SODIUM: 140 mmol/L (ref 135–145)

## 2015-08-05 MED ORDER — OXYCODONE-ACETAMINOPHEN 5-325 MG PO TABS: 1.0000 | ORAL_TABLET | ORAL | Status: DC | PRN

## 2015-08-05 MED ORDER — MORPHINE SULFATE (PF) 2 MG/ML IV SOLN: 1.0000 mg | INTRAVENOUS | Status: DC | PRN

## 2015-08-05 NOTE — Care Management Important Message (Signed)
Important Message  Patient Details  Name: BLAYNE FRANKIE MRN: [id number] Date of Birth: 02-25-1932   Medicare Important Message Given:  Yes-second notification given    Shelda Altes 08/05/2015, 3:33 Central Message  Patient Details  Name: BALDEMAR DADY MRN: [id number] Date of Birth: Jun 02, 1932   Medicare Important Message Given:  Yes-second notification given    Shelda Altes 08/05/2015, 3:33 PM

## 2015-08-05 NOTE — Progress Notes (Signed)
Patient ID: Keith [name], male   DOB: June 04, 1932, 81 y.o.   MRN: [id number] 3 Days Post-Op  Subjective: Had BM yesterday.  No n/v.  Did not receive meds for nausea.  Pain med requirements decreasing.    Objective: Vital signs in last 24 hours: Temp:  [97.2 F (36.2 C)-98.5 F (36.9 C)] 98.5 F (36.9 C) (10/28 0550) Pulse Rate:  [54-64] 54 (10/28 0550) Resp:  [13-20] 20 (10/28 0550) BP: (119-141)/(61-74) 119/74 mmHg (10/28 0550) SpO2:  [92 %-99 %] 99 % (10/28 0550) Last BM Date: 08/04/15  Intake/Output from previous day: 10/27 0701 - 10/28 0700 In: 2223.3 [P.O.:820; I.V.:1403.3] Out: 500 [Urine:500] Intake/Output this shift:    General appearance: alert, cooperative and no distress Resp: breathing comfortably Cardio: regular rate and rhythm GI: soft, approp tender.   no erythema on lower incision.    Lab Results:   Recent Labs  08/04/15 0425 08/05/15 0525  WBC 12.6* 6.8  HGB 12.1* 12.7*  HCT 36.4* 38.4*  PLT 125* 131*   BMET  Recent Labs  08/04/15 0425 08/05/15 0525  NA 137 140  K 4.9 4.1  CL 105 106  CO2 28 30  GLUCOSE 113* 115*  BUN 9 8  CREATININE 0.66 0.77  CALCIUM 8.4* 8.7*   PT/INR No results for input(s): LABPROT, INR in the last 72 hours. ABG No results for input(s): PHART, HCO3 in the last 72 hours.  Invalid input(s): PCO2, PO2  Studies/Results: No results found.  Anti-infectives: Anti-infectives    Start     Dose/Rate Route Frequency Ordered Stop   08/02/15 1200  gentamicin (GARAMYCIN) 380 mg in dextrose 5 % 100 mL IVPB  Status:  Discontinued     380 mg 109.5 mL/hr over 60 Minutes Intravenous  Once 08/02/15 1137 08/05/15 0704   08/02/15 1145  clindamycin (CLEOCIN) IVPB 900 mg  Status:  Discontinued     900 mg 100 mL/hr over 30 Minutes Intravenous  Once 08/02/15 1135 08/05/15 0704   08/02/15 1052  cefoTEtan (CEFOTAN) 2 g in dextrose 5 % 50 mL IVPB  Status:  Discontinued     2 g 100 mL/hr over 30 Minutes Intravenous 30 min pre-op  08/02/15 1052 08/02/15 1132      Assessment/Plan: s/p Procedure(s): XI ROBOTIC ASSISTED LOWER ANTERIOR RESECTION (N/A) PAS hose, SQ heparin for VTE prophylaxis Advance diet D/c PCA Ambulate, IS Possible d/c in next 1-2 days.     LOS: 3 days    [facility] 08/05/2015

## 2015-08-05 NOTE — Discharge Instructions (Signed)
[facility]      [facility] °[phone] ° °ABDOMINAL SURGERY: POST OP INSTRUCTIONS ° °Always review your discharge instruction sheet given to you by the facility where your surgery was performed. ° °IF YOU HAVE DISABILITY OR FAMILY LEAVE FORMS, YOU MUST BRING THEM TO THE OFFICE FOR PROCESSING.  PLEASE DO NOT GIVE THEM TO YOUR DOCTOR. ° °1. A prescription for pain medication may be given to you upon discharge.  Take your pain medication as prescribed, if needed.  If narcotic pain medicine is not needed, then you may take acetaminophen (Tylenol) or ibuprofen (Advil) as needed. °2. Take your usually prescribed medications unless otherwise directed. °3. If you need a refill on your pain medication, please contact your pharmacy. They will contact our office to request authorization.  Prescriptions will not be filled after 5pm or on week-ends. °4. You should follow a light diet the first few days after arrival home, such as soup and crackers, pudding, etc.unless your doctor has advised otherwise. A high-fiber, low fat diet can be resumed as tolerated.   Be sure to include lots of fluids daily. Most patients will experience some swelling and bruising on the chest and neck area.  Ice packs will help.  Swelling and bruising can take several days to resolve °5. Most patients will experience some swelling and bruising in the area of the incision. Ice pack will help. Swelling and bruising can take several days to resolve..  °6. It is common to experience some constipation if taking pain medication after surgery.  Increasing fluid intake and taking a stool softener will usually help or prevent this problem from occurring.  A mild laxative (Milk of Magnesia or Miralax) should be taken according to package directions if there are no bowel movements after 48 hours. °7.  You may have steri-strips (small skin tapes) in place directly over the incision.  These strips should be left on the skin for 10-14 days.  If your  surgeon used skin glue on the incision, you may shower in 48 hours.  The glue will flake off over the next 2-3 weeks.  Any sutures or staples will be removed at the office during your follow-up visit. You may find that a light gauze bandage over your incision may keep your staples from being rubbed or pulled. You may shower and replace the bandage daily. °8. ACTIVITIES:  You may resume regular (light) daily activities beginning the next day--such as daily self-care, walking, climbing stairs--gradually increasing activities as tolerated.  You may have sexual intercourse when it is comfortable.  Refrain from any heavy lifting or straining until approved by your doctor. °a. You may drive when you no longer are taking prescription pain medication, you can comfortably wear a seatbelt, and you can safely maneuver your car and apply brakes °b. Return to Work: __________8 weeks if applicable_________________________ °9. You should see your doctor in the office for a follow-up appointment approximately two weeks after your surgery.  Make sure that you call for this appointment within a day or two after you arrive home to insure a convenient appointment time. °OTHER INSTRUCTIONS:  °_____________________________________________________________ °_____________________________________________________________ ° °WHEN TO CALL YOUR DOCTOR: °1. Fever over 101.0 °2. Inability to urinate °3. Nausea and/or vomiting °4. Extreme swelling or bruising °5. Continued bleeding from incision. °6. Increased pain, redness, or drainage from the incision. °7. Difficulty swallowing or breathing °8. Muscle cramping or spasms. °9. Numbness or tingling in hands or feet or around lips. ° °The clinic staff is   available to answer your questions during regular business hours.  Please don’t hesitate to call and ask to speak to one of the nurses if you have concerns. ° °For further questions, please visit [email or web address] ° ° ° °

## 2015-08-06 LAB — BASIC METABOLIC PANEL
ANION GAP: 5 (ref 5–15)
BUN: 11 mg/dL (ref 6–20)
CO2: 29 mmol/L (ref 22–32)
Calcium: 8.6 mg/dL — ABNORMAL LOW (ref 8.9–10.3)
Chloride: 107 mmol/L (ref 101–111)
Creatinine, Ser: 0.75 mg/dL (ref 0.61–1.24)
Glucose, Bld: 104 mg/dL — ABNORMAL HIGH (ref 65–99)
POTASSIUM: 3.8 mmol/L (ref 3.5–5.1)
Sodium: 141 mmol/L (ref 135–145)

## 2015-08-06 LAB — CBC
HCT: 37.2 % — ABNORMAL LOW (ref 39.0–52.0)
HEMOGLOBIN: 12.5 g/dL — AB (ref 13.0–17.0)
MCH: 29.5 pg (ref 26.0–34.0)
MCHC: 33.6 g/dL (ref 30.0–36.0)
MCV: 87.7 fL (ref 78.0–100.0)
Platelets: 127 10*3/uL — ABNORMAL LOW (ref 150–400)
RBC: 4.24 MIL/uL (ref 4.22–5.81)
RDW: 13.1 % (ref 11.5–15.5)
WBC: 4.6 10*3/uL (ref 4.0–10.5)

## 2015-08-06 NOTE — Discharge Summary (Signed)
Physician Discharge Summary  Patient ID:  Keith [name]  MRN: [id number]  DOB/AGE: [birth year] 81 y.o.  Admit date: 08/02/2015 Discharge date: 08/06/2015  Discharge Diagnoses:  1.  Sigmoid colon cancer  CEA - 1.2 - 07/15/2015  Path - 2.2 cm adenoca, focal margin involvement, 2/14 nodes positive  2.  Back pain 3.  GERD  Active Problems:   Rectosigmoid cancer [facility])   Operation: Procedure(s): XI ROBOTIC ASSISTED LOWER ANTERIOR RESECTION on 08/02/2015 Barry Dienes  Discharged Condition: good  Hospital Course: Keith [name] is an 81 y.o. male whose primary care physician is Alesia Richards, MD and who was admitted 08/02/2015 with a chief complaint of No chief complaint on file. Marland Kitchen   He was brought to the operating room on 08/02/2015 and underwent  ROBOTIC ASSISTED LOWER ANTERIOR RESECTION.   He is now 4 days post op.  He has done well.  His wife and daughter are in the room. He met me at the door ready to go home.  The discharge instructions were reviewed with the patient.  Consults: None  Significant Diagnostic Studies: Results for orders placed or performed during the hospital encounter of 08/02/15  CBC  Result Value Ref Range   WBC 11.8 (H) 4.0 - 10.5 K/uL   RBC 4.61 4.22 - 5.81 MIL/uL   Hemoglobin 13.9 13.0 - 17.0 g/dL   HCT 40.7 39.0 - 52.0 %   MCV 88.3 78.0 - 100.0 fL   MCH 30.2 26.0 - 34.0 pg   MCHC 34.2 30.0 - 36.0 g/dL   RDW 12.9 11.5 - 15.5 %   Platelets 132 (L) 150 - 400 K/uL  Creatinine, serum  Result Value Ref Range   Creatinine, Ser 0.87 0.61 - 1.24 mg/dL   GFR calc non Af Amer >60 >60 mL/min   GFR calc Af Amer >60 >60 mL/min  CBC  Result Value Ref Range   WBC 12.9 (H) 4.0 - 10.5 K/uL   RBC 4.38 4.22 - 5.81 MIL/uL   Hemoglobin 13.1 13.0 - 17.0 g/dL   HCT 39.4 39.0 - 52.0 %   MCV 90.0 78.0 - 100.0 fL   MCH 29.9 26.0 - 34.0 pg   MCHC 33.2 30.0 - 36.0 g/dL   RDW 13.2 11.5 - 15.5 %   Platelets 159 150 - 400 K/uL  Basic metabolic panel  Result  Value Ref Range   Sodium 138 135 - 145 mmol/L   Potassium 4.3 3.5 - 5.1 mmol/L   Chloride 106 101 - 111 mmol/L   CO2 26 22 - 32 mmol/L   Glucose, Bld 166 (H) 65 - 99 mg/dL   BUN 11 6 - 20 mg/dL   Creatinine, Ser 0.74 0.61 - 1.24 mg/dL   Calcium 8.7 (L) 8.9 - 10.3 mg/dL   GFR calc non Af Amer >60 >60 mL/min   GFR calc Af Amer >60 >60 mL/min   Anion gap 6 5 - 15  CBC  Result Value Ref Range   WBC 12.6 (H) 4.0 - 10.5 K/uL   RBC 4.05 (L) 4.22 - 5.81 MIL/uL   Hemoglobin 12.1 (L) 13.0 - 17.0 g/dL   HCT 36.4 (L) 39.0 - 52.0 %   MCV 89.9 78.0 - 100.0 fL   MCH 29.9 26.0 - 34.0 pg   MCHC 33.2 30.0 - 36.0 g/dL   RDW 13.3 11.5 - 15.5 %   Platelets 125 (L) 150 - 400 K/uL  Basic metabolic panel  Result Value Ref Range   Sodium 137  135 - 145 mmol/L   Potassium 4.9 3.5 - 5.1 mmol/L   Chloride 105 101 - 111 mmol/L   CO2 28 22 - 32 mmol/L   Glucose, Bld 113 (H) 65 - 99 mg/dL   BUN 9 6 - 20 mg/dL   Creatinine, Ser 0.66 0.61 - 1.24 mg/dL   Calcium 8.4 (L) 8.9 - 10.3 mg/dL   GFR calc non Af Amer >60 >60 mL/min   GFR calc Af Amer >60 >60 mL/min   Anion gap 4 (L) 5 - 15  CBC  Result Value Ref Range   WBC 6.8 4.0 - 10.5 K/uL   RBC 4.24 4.22 - 5.81 MIL/uL   Hemoglobin 12.7 (L) 13.0 - 17.0 g/dL   HCT 38.4 (L) 39.0 - 52.0 %   MCV 90.6 78.0 - 100.0 fL   MCH 30.0 26.0 - 34.0 pg   MCHC 33.1 30.0 - 36.0 g/dL   RDW 13.2 11.5 - 15.5 %   Platelets 131 (L) 150 - 400 K/uL  Basic metabolic panel  Result Value Ref Range   Sodium 140 135 - 145 mmol/L   Potassium 4.1 3.5 - 5.1 mmol/L   Chloride 106 101 - 111 mmol/L   CO2 30 22 - 32 mmol/L   Glucose, Bld 115 (H) 65 - 99 mg/dL   BUN 8 6 - 20 mg/dL   Creatinine, Ser 0.77 0.61 - 1.24 mg/dL   Calcium 8.7 (L) 8.9 - 10.3 mg/dL   GFR calc non Af Amer >60 >60 mL/min   GFR calc Af Amer >60 >60 mL/min   Anion gap 4 (L) 5 - 15  CBC  Result Value Ref Range   WBC 4.6 4.0 - 10.5 K/uL   RBC 4.24 4.22 - 5.81 MIL/uL   Hemoglobin 12.5 (L) 13.0 - 17.0 g/dL    HCT 37.2 (L) 39.0 - 52.0 %   MCV 87.7 78.0 - 100.0 fL   MCH 29.5 26.0 - 34.0 pg   MCHC 33.6 30.0 - 36.0 g/dL   RDW 13.1 11.5 - 15.5 %   Platelets 127 (L) 150 - 400 K/uL  Basic metabolic panel  Result Value Ref Range   Sodium 141 135 - 145 mmol/L   Potassium 3.8 3.5 - 5.1 mmol/L   Chloride 107 101 - 111 mmol/L   CO2 29 22 - 32 mmol/L   Glucose, Bld 104 (H) 65 - 99 mg/dL   BUN 11 6 - 20 mg/dL   Creatinine, Ser 0.75 0.61 - 1.24 mg/dL   Calcium 8.6 (L) 8.9 - 10.3 mg/dL   GFR calc non Af Amer >60 >60 mL/min   GFR calc Af Amer >60 >60 mL/min   Anion gap 5 5 - 15    No results found.  Discharge Exam:  Filed Vitals:   08/06/15 0630  BP: 138/71  Pulse: 57  Temp: 97.6 F (36.4 C)  Resp: 18    General: WN older WM who is alert and generally healthy appearing.  Lungs: Clear to auscultation and symmetric breath sounds. Heart:  RRR. No murmur or rub. Abdomen: Soft.  No hernia. Normal bowel sounds.  Wounds looks good.  Discharge Medications:     Medication List    TAKE these medications        aspirin 81 MG tablet  Take 81 mg by mouth every morning.     atorvastatin 40 MG tablet  Commonly known as:  LIPITOR  TAKE 1 TABLET EVERY DAY     FISH OIL PO  Take 1 tablet by mouth every morning.     Magnesium 250 MG Tabs  Take 1 tablet by mouth every morning.     metroNIDAZOLE 500 MG tablet  Commonly known as:  FLAGYL  Take 2 tablets by mouth 3 (three) times daily. @ 2 pm, 3 pm, and 10 pm. The day prior to surgery.     neomycin 500 MG tablet  Commonly known as:  MYCIFRADIN  Take 2 tablets by mouth 3 (three) times daily. @'@2pm' , 3 pm, and 10 pm the day prior to surgery.     oxyCODONE-acetaminophen 5-325 MG tablet  Commonly known as:  PERCOCET/ROXICET  Take 1-2 tablets by mouth every 4 (four) hours as needed for moderate pain (once taking PO).     ranitidine 300 MG tablet  Commonly known as:  ZANTAC  Take 1 tablet (300 mg total) by mouth 2 (two) times daily. For  indigestion and acid reflux     VITAMIN D PO  Take 2,000 Int'l Units by mouth every morning.        Disposition: 01-Home or Self Care      Discharge Instructions    Diet - low sodium heart healthy    Complete by:  As directed      Increase activity slowly    Complete by:  As directed            Follow-up Information    Follow up with [facility], [place] In 2 weeks.   Specialty:  General Surgery   Contact information:   [address] [place] [address] [phone]        Signed: Alphonsa Overall, M.D., [facility] Office:  [phone]  08/06/2015, 9:07 AM

## 2015-08-14 NOTE — Anesthesia Postprocedure Evaluation (Signed)
  Anesthesia Post-op Note  Patient: Keith [name]  Procedure(s) Performed: Procedure(s) (LRB): XI ROBOTIC ASSISTED LOWER ANTERIOR RESECTION (N/A)  Patient Location: PACU  Anesthesia Type: General  Level of Consciousness: awake and alert   Airway and Oxygen Therapy: Patient Spontanous Breathing  Post-op Pain: mild  Post-op Assessment: Post-op Vital signs reviewed, Patient's Cardiovascular Status Stable, Respiratory Function Stable, Patent Airway and No signs of Nausea or vomiting  Last Vitals:  Filed Vitals:   08/06/15 0630  BP: 138/71  Pulse: 57  Temp: 36.4 C  Resp: 18    Post-op Vital Signs: stable   Complications: No apparent anesthesia complications

## 2015-08-16 ENCOUNTER — Ambulatory Visit (HOSPITAL_BASED_OUTPATIENT_CLINIC_OR_DEPARTMENT_OTHER): Payer: Commercial Managed Care - HMO | Admitting: Oncology

## 2015-08-16 ENCOUNTER — Telehealth: Payer: Self-pay | Admitting: *Deleted

## 2015-08-16 ENCOUNTER — Telehealth: Payer: Self-pay | Admitting: Oncology

## 2015-08-16 ENCOUNTER — Encounter: Payer: Self-pay | Admitting: *Deleted

## 2015-08-16 VITALS — BP 127/70 | HR 59 | Temp 97.5°F | Resp 18 | Ht 70.5 in | Wt 160.1 lb

## 2015-08-16 DIAGNOSIS — C187 Malignant neoplasm of sigmoid colon: Secondary | ICD-10-CM | POA: Diagnosis not present

## 2015-08-16 DIAGNOSIS — I2510 Atherosclerotic heart disease of native coronary artery without angina pectoris: Secondary | ICD-10-CM | POA: Diagnosis not present

## 2015-08-16 NOTE — Progress Notes (Signed)
Oncology Nurse Navigator Documentation  Oncology Nurse Navigator Flowsheets 08/16/2015  Referral date to RadOnc/MedOnc -  Navigator Encounter Type Post op-  Patient Visit Type Medonc  Treatment Phase Treatment planning  Barriers/Navigation Needs Education  Education Understanding Treatment Options; Symptom Management  Interventions Education Method  Education Method Verbal;Written;Teach-back  Support Groups/Services -  Time Spent with Patient 30  Reviewed potential side effects of Xeloda, concentrating on management of diarrhea. He and his wife will attend chemo teaching class on 08/19/15. Plan to start treatment on 08/29/15. Explained that med must come from specialty pharmacy. Oral chemo specialist, Montel Clock, PharmD will call him regarding drug. Per request of Dr. Benay Spice, requested MSI testing on his pathology case.

## 2015-08-16 NOTE — Progress Notes (Signed)
  [place] OFFICE PROGRESS NOTE   Diagnosis: Colon cancer  INTERVAL HISTORY:   Keith [name] returns as scheduled. He underwent a robotic low anterior resection by Dr. Barry Dienes on 08/02/2015. The tumor was above the peritoneal reflection. The pathology [id number]) confirmed invasive adenocarcinoma extending into muscular propria with focal positivity of the circumferential margin. Lymphovascular invasion was present. No perineural invasion. No tumor deposits. 2 of 14 lymph nodes contained metastatic carcinoma with extranodal extension. Mismatch repair protein expression was intact.  He reports diarrhea following surgery. This has resolved. No other complaint.  Objective:  Vital signs in last 24 hours:  Blood pressure 127/70, pulse 59, temperature 97.5 F (36.4 C), temperature source Oral, resp. rate 18, height 5' 10.5" (1.791 m), weight 160 lb 1.6 oz (72.621 kg), SpO2 99 %.  Resp: Lungs clear bilaterally Cardio: Regular rate and rhythm GI: No hepatosplenomegaly, healed surgical incisions, firm induration above the low anterior incision Vascular: No leg edema   Lab Results:  Lab Results  Component Value Date   WBC 4.6 08/06/2015   HGB 12.5* 08/06/2015   HCT 37.2* 08/06/2015   MCV 87.7 08/06/2015   PLT 127* 08/06/2015   NEUTROABS 3.5 07/28/2015     Lab Results  Component Value Date   CEA 1.3 07/15/2015     Medications: I have reviewed the patient's current medications.  Assessment/Plan: 1. Adenocarcinoma at 20 cm from the anal verge, most consistent with sigmoid colon cancer  Staging PET scan 07/05/2015 with no evidence of metastatic disease  Endoscopic ultrasound 06/29/2015 consistent with a T2N0 tumor  Status post a robotic low anterior resection 08/02/2015 with tumor confirmed above the peritoneal reflection,pT2 vs pT3,N1b, focally positive circumferential margin, no loss of mismatch repair protein expression  2. History of coronary artery  disease    Disposition:  Keith [name] has recovered from the low anterior resection. He has been diagnosed with stage III colon cancer. I reviewed the details of the surgical pathology report with him today. We discussed the prognosis and indication for adjuvant treatment. He has a significant risk of developing recurrent disease over the next few years. I recommend adjuvant capecitabine.  I reviewed the potential toxicities associated with capecitabine including the chance for mucositis, diarrhea, and hematologic toxicity. We discussed the rash, hyperpigmentation, and hand/foot syndrome associated with capecitabine. He agrees to proceed. He will attend a chemotherapy teaching class.  Keith [name] will begin a first cycle of adjuvant capecitabine on 08/29/2015. He will return for an office visit 09/15/2015.   [name], MD  08/16/2015  9:11 AM

## 2015-08-16 NOTE — Telephone Encounter (Signed)
Patient left VM requesting to speak with Dr. Benay Spice. Has more questions about his cancer and chance of recurrence.

## 2015-08-16 NOTE — Telephone Encounter (Signed)
GAVE AND PRINTED APPT SCHEDA DN AVS FOR PT FOR nov AND dec

## 2015-08-17 ENCOUNTER — Telehealth: Payer: Self-pay | Admitting: Oncology

## 2015-08-17 ENCOUNTER — Telehealth: Payer: Self-pay | Admitting: Pharmacist

## 2015-08-17 ENCOUNTER — Other Ambulatory Visit: Payer: Self-pay | Admitting: *Deleted

## 2015-08-17 DIAGNOSIS — C187 Malignant neoplasm of sigmoid colon: Secondary | ICD-10-CM

## 2015-08-17 MED ORDER — CAPECITABINE 500 MG PO TABS: 800.0000 mg/m2 | ORAL_TABLET | Freq: Two times a day (BID) | ORAL | Status: DC

## 2015-08-17 NOTE — Telephone Encounter (Signed)
11/9: Rx for Xeloda sent to [facility] long outpatient pharmacy

## 2015-08-17 NOTE — Telephone Encounter (Signed)
Left message for PT/[place] requesting appointment with Earlie Counts - awaiting return call. No other orders per 11/9 pof.

## 2015-08-17 NOTE — Progress Notes (Signed)
Reported to Dr. Benay Spice frequent bowel movements with urgency and continence issues. Asking about physical therapy consult. Will place referral per Dr. Benay Spice.

## 2015-08-18 ENCOUNTER — Other Ambulatory Visit (HOSPITAL_COMMUNITY)
Admission: RE | Admit: 2015-08-18 | Discharge: 2015-08-18 | Disposition: A | Discharge status: Home / Self Care | Payer: Commercial Managed Care - HMO | Source: Ambulatory Visit | Attending: General Surgery | Admitting: General Surgery

## 2015-08-18 DIAGNOSIS — C189 Malignant neoplasm of colon, unspecified: Secondary | ICD-10-CM | POA: Diagnosis present

## 2015-08-19 ENCOUNTER — Encounter (HOSPITAL_BASED_OUTPATIENT_CLINIC_OR_DEPARTMENT_OTHER): Payer: Commercial Managed Care - HMO | Admitting: Oncology

## 2015-08-19 ENCOUNTER — Other Ambulatory Visit: Payer: Commercial Managed Care - HMO

## 2015-08-19 DIAGNOSIS — C187 Malignant neoplasm of sigmoid colon: Secondary | ICD-10-CM | POA: Diagnosis not present

## 2015-08-19 LAB — COMPREHENSIVE METABOLIC PANEL (CC13)
ALT: 22 U/L (ref 0–55)
ANION GAP: 8 meq/L (ref 3–11)
AST: 23 U/L (ref 5–34)
Albumin: 3.2 g/dL — ABNORMAL LOW (ref 3.5–5.0)
Alkaline Phosphatase: 62 U/L (ref 40–150)
BUN: 13 mg/dL (ref 7.0–26.0)
CALCIUM: 9.1 mg/dL (ref 8.4–10.4)
CHLORIDE: 107 meq/L (ref 98–109)
CO2: 27 meq/L (ref 22–29)
Creatinine: 0.8 mg/dL (ref 0.7–1.3)
EGFR: 84 mL/min/{1.73_m2} — AB (ref >90)
Glucose: 108 mg/dl (ref 70–140)
POTASSIUM: 4.1 meq/L (ref 3.5–5.1)
Sodium: 142 mEq/L (ref 136–145)
Total Bilirubin: 1.36 mg/dL — ABNORMAL HIGH (ref 0.20–1.20)
Total Protein: 6 g/dL — ABNORMAL LOW (ref 6.4–8.3)

## 2015-08-19 LAB — CBC WITH DIFFERENTIAL/PLATELET
BASO%: 0.4 % (ref 0.0–2.0)
BASOS ABS: 0 10*3/uL (ref 0.0–0.1)
EOS ABS: 0.2 10*3/uL (ref 0.0–0.5)
EOS%: 4.5 % (ref 0.0–7.0)
HEMATOCRIT: 41.9 % (ref 38.4–49.9)
HEMOGLOBIN: 14 g/dL (ref 13.0–17.1)
LYMPH#: 1.5 10*3/uL (ref 0.9–3.3)
LYMPH%: 28.8 % (ref 14.0–49.0)
MCH: 30.3 pg (ref 27.2–33.4)
MCHC: 33.4 g/dL (ref 32.0–36.0)
MCV: 90.7 fL (ref 79.3–98.0)
MONO#: 0.4 10*3/uL (ref 0.1–0.9)
MONO%: 8.6 % (ref 0.0–14.0)
NEUT%: 57.7 % (ref 39.0–75.0)
NEUTROS ABS: 3 10*3/uL (ref 1.5–6.5)
PLATELETS: 201 10*3/uL (ref 140–400)
RBC: 4.62 10*6/uL (ref 4.20–5.82)
RDW: 13.3 % (ref 11.0–14.6)
WBC: 5.1 10*3/uL (ref 4.0–10.3)

## 2015-08-19 NOTE — Telephone Encounter (Signed)
11/11: Rx approved through [facility] under part [place] # [id number]. Approval is from 08/19/2015-08/18/2017. Will follow up with outpatient pharmacy for coverage and assistance.

## 2015-08-24 ENCOUNTER — Other Ambulatory Visit: Payer: Self-pay | Admitting: Oncology

## 2015-08-24 MED ORDER — CAPECITABINE 500 MG PO TABS: 800.0000 mg/m2 | ORAL_TABLET | Freq: Two times a day (BID) | ORAL | Status: DC

## 2015-08-26 ENCOUNTER — Ambulatory Visit: Payer: Self-pay | Admitting: Internal Medicine

## 2015-08-30 ENCOUNTER — Telehealth: Payer: Self-pay | Admitting: *Deleted

## 2015-08-30 ENCOUNTER — Encounter: Payer: Self-pay | Admitting: Pharmacist

## 2015-08-30 DIAGNOSIS — Z5111 Encounter for antineoplastic chemotherapy: Secondary | ICD-10-CM

## 2015-08-30 NOTE — Telephone Encounter (Signed)
Left message on voicemail for pt to call office. Need to know if he was able to get Xeloda. If not, Dr. Benay Spice recommends weekly 5FU/ Leucovorin.

## 2015-08-30 NOTE — Progress Notes (Signed)
Oral Chemotherapy Pharmacist Encounter   I spoke with patient for overview of new oral chemotherapy medication: Xeloda. Pt is doing well. The prescriptions have been sent to [facility] as patient is paying high copay and can get a cheaper price through his insurance specialty pharmacy (Copay is $411). Pt prefers oral medication to IV Leucovorin/5-FU therapy so he is ok paying the cost. All outside funding is closed for the year but we will try to find assistance for him in the new year.   Counseled patient on administration, dosing, side effects, safe handling, and monitoring. Side effects include but not limited to: fatigue, diarrhea, mouth sores, and hand-foot syndrome.  Mr. Retz voiced understanding and appreciation.   All questions answered.  He plans to start medication on Friday 11/25 after the Thanksgiving Holiday.   Will follow up in 1-2 weeks for adherence and toxicity management.   Thank you,  Montel Clock, PharmD, [facility]

## 2015-08-30 NOTE — Telephone Encounter (Signed)
Pt returned call, he received Xeloda from [facility] today via [place]. He plans to travel for Thanksgiving, will start Xeloda on 09/02/15. Teach back complete. Pt instructed to call office for side effect management or any questions.

## 2015-09-12 ENCOUNTER — Telehealth: Payer: Self-pay | Admitting: Oncology

## 2015-09-12 NOTE — Telephone Encounter (Signed)
Due to LT out moved 12/8 f/u to BS per BS. Spoke with patient re change and new time for 12/8 @ 10:15 am. Patient answered call mid voicemail.

## 2015-09-13 ENCOUNTER — Encounter (HOSPITAL_COMMUNITY): Payer: Self-pay

## 2015-09-14 ENCOUNTER — Other Ambulatory Visit: Payer: Self-pay | Admitting: Oncology

## 2015-09-14 ENCOUNTER — Telehealth: Payer: Self-pay | Admitting: *Deleted

## 2015-09-14 NOTE — Telephone Encounter (Signed)
TC from patient requesting refill on his Xeloda. He will need it by 09/21/15 through [facility].

## 2015-09-15 ENCOUNTER — Ambulatory Visit (HOSPITAL_BASED_OUTPATIENT_CLINIC_OR_DEPARTMENT_OTHER): Payer: Commercial Managed Care - HMO | Admitting: Oncology

## 2015-09-15 ENCOUNTER — Ambulatory Visit (HOSPITAL_BASED_OUTPATIENT_CLINIC_OR_DEPARTMENT_OTHER): Payer: Commercial Managed Care - HMO | Admitting: *Deleted

## 2015-09-15 ENCOUNTER — Telehealth: Payer: Self-pay | Admitting: Oncology

## 2015-09-15 VITALS — BP 120/64 | HR 57 | Temp 97.8°F | Resp 18 | Ht 70.5 in | Wt 160.2 lb

## 2015-09-15 DIAGNOSIS — L538 Other specified erythematous conditions: Secondary | ICD-10-CM | POA: Diagnosis not present

## 2015-09-15 DIAGNOSIS — C187 Malignant neoplasm of sigmoid colon: Secondary | ICD-10-CM

## 2015-09-15 LAB — COMPREHENSIVE METABOLIC PANEL
ALT: 11 U/L (ref 0–55)
AST: 19 U/L (ref 5–34)
Albumin: 3.4 g/dL — ABNORMAL LOW (ref 3.5–5.0)
Alkaline Phosphatase: 61 U/L (ref 40–150)
Anion Gap: 9 mEq/L (ref 3–11)
BUN: 13.5 mg/dL (ref 7.0–26.0)
CHLORIDE: 105 meq/L (ref 98–109)
CO2: 26 mEq/L (ref 22–29)
Calcium: 9.1 mg/dL (ref 8.4–10.4)
Creatinine: 0.8 mg/dL (ref 0.7–1.3)
EGFR: 84 mL/min/{1.73_m2} — ABNORMAL LOW (ref >90)
GLUCOSE: 85 mg/dL (ref 70–140)
POTASSIUM: 4.4 meq/L (ref 3.5–5.1)
SODIUM: 140 meq/L (ref 136–145)
Total Bilirubin: 1.88 mg/dL — ABNORMAL HIGH (ref 0.20–1.20)
Total Protein: 6.3 g/dL — ABNORMAL LOW (ref 6.4–8.3)

## 2015-09-15 LAB — CBC WITH DIFFERENTIAL/PLATELET
BASO%: 0.5 % (ref 0.0–2.0)
BASOS ABS: 0 10*3/uL (ref 0.0–0.1)
EOS%: 5.2 % (ref 0.0–7.0)
Eosinophils Absolute: 0.3 10*3/uL (ref 0.0–0.5)
HCT: 41.1 % (ref 38.4–49.9)
HGB: 13.9 g/dL (ref 13.0–17.1)
LYMPH%: 31.5 % (ref 14.0–49.0)
MCH: 30.2 pg (ref 27.2–33.4)
MCHC: 33.8 g/dL (ref 32.0–36.0)
MCV: 89.4 fL (ref 79.3–98.0)
MONO#: 0.6 10*3/uL (ref 0.1–0.9)
MONO%: 11 % (ref 0.0–14.0)
NEUT#: 2.7 10*3/uL (ref 1.5–6.5)
NEUT%: 51.8 % (ref 39.0–75.0)
Platelets: 196 10*3/uL (ref 140–400)
RBC: 4.6 10*6/uL (ref 4.20–5.82)
RDW: 13.1 % (ref 11.0–14.6)
WBC: 5.2 10*3/uL (ref 4.0–10.3)
lymph#: 1.6 10*3/uL (ref 0.9–3.3)

## 2015-09-15 NOTE — Progress Notes (Signed)
  [place] OFFICE PROGRESS NOTE   Diagnosis: Colon cancer  INTERVAL HISTORY:   Keith [name] returns as scheduled. He completed a first cycle of adjuvant capecitabine beginning 09/02/2015. He reports several loose stools daily since the time of surgery. Imodium helps. No severe diarrhea. No hand or foot pain. No mouth sores. He has noted erythematous skin lesions at the arms for the past week.  Objective:  Vital signs in last 24 hours:  Blood pressure 120/64, pulse 57, temperature 97.8 F (36.6 C), temperature source Oral, resp. rate 18, height 5' 10.5" (1.791 m), weight 160 lb 3.2 oz (72.666 kg), SpO2 98 %.    HEENT: No thrush or ulcers Resp: Lungs clear bilaterally Cardio: Regular rate and rhythm GI: No hepatosplenomegaly, firmness in the abdominal wall superior to the low transverse incision Vascular: No leg edema  Skin: Erythematous skin lesions over the nose, face, and forearms. Mild changes of senile purpura at the dorsum of the hands. No palmar erythema. Dryness of the soles without erythema or skin breakdown    Lab Results:  Lab Results  Component Value Date   WBC 5.1 08/19/2015   HGB 14.0 08/19/2015   HCT 41.9 08/19/2015   MCV 90.7 08/19/2015   PLT 201 08/19/2015   NEUTROABS 3.0 08/19/2015     Medications: I have reviewed the patient's current medications.  Assessment/Plan: 1. Adenocarcinoma at 20 cm from the anal verge, most consistent with sigmoid colon cancer  Staging PET scan 07/05/2015 with no evidence of metastatic disease  Endoscopic ultrasound 06/29/2015 consistent with a T2N0 tumor  Status post a robotic low anterior resection 08/02/2015 with tumor confirmed above the peritoneal reflection,pT2 vs pT3,N1b, focally positive circumferential margin, no loss of mismatch repair protein expression  Cycle 1 adjuvant Xeloda 09/02/2015  Cycle 2 adjuvant Xeloda 09/23/2015  2. History of coronary artery disease    Disposition:  Mr.  [name] tolerated the first cycle of Xeloda well. He will complete cycle one today. The erythematous lesions over the forearms are likely related to Xeloda. He will continue Imodium as needed for the frequent bowel movements. We will check a CBC as he leaves the office today.  Keith [name] will return for an office visit 10/11/2015.  [name], MD  09/15/2015  10:28 AM

## 2015-09-15 NOTE — Telephone Encounter (Signed)
per pof to sch pt appt-gave pt copy of avs °

## 2015-09-19 ENCOUNTER — Telehealth: Payer: Self-pay | Admitting: Pharmacist

## 2015-09-19 NOTE — Telephone Encounter (Signed)
12/12 - called patient to follow up on oral medication: Xeloda. No answer. Left VM for patient to call back with questions. Pt had office visit with Dr. Benay Spice last week and had new rx/refills sent to [facility].   Thank you,  Montel Clock, PharmD, [facility]

## 2015-09-28 NOTE — Progress Notes (Signed)
This encounter was created in error - please disregard.

## 2015-10-04 ENCOUNTER — Telehealth: Payer: Self-pay | Admitting: *Deleted

## 2015-10-04 NOTE — Telephone Encounter (Signed)
Per Dr. Benay Spice; notified pt's wife to hold Xeloda today and MD will assess @ 1/3 appt; may reduce dose of Xeloda at that time.  Wife verbalized understanding and expressed appreciation for call back.

## 2015-10-04 NOTE — Telephone Encounter (Signed)
Patient has taken Xeloda for 1 week. Since last Thursday patient has been experiencing burning feet and difficulty walking on them. Patient would like MD Sherrill advise on this matter. Message sent to MD Sherrill/RN Amy.

## 2015-10-11 ENCOUNTER — Other Ambulatory Visit (HOSPITAL_BASED_OUTPATIENT_CLINIC_OR_DEPARTMENT_OTHER): Payer: Commercial Managed Care - HMO

## 2015-10-11 ENCOUNTER — Other Ambulatory Visit: Payer: Self-pay | Admitting: *Deleted

## 2015-10-11 ENCOUNTER — Ambulatory Visit (HOSPITAL_BASED_OUTPATIENT_CLINIC_OR_DEPARTMENT_OTHER): Payer: Commercial Managed Care - HMO | Admitting: Oncology

## 2015-10-11 ENCOUNTER — Encounter: Payer: Self-pay | Admitting: Oncology

## 2015-10-11 VITALS — BP 121/80 | HR 58 | Temp 97.5°F | Resp 18 | Ht 70.5 in | Wt 158.2 lb

## 2015-10-11 DIAGNOSIS — L270 Generalized skin eruption due to drugs and medicaments taken internally: Secondary | ICD-10-CM

## 2015-10-11 DIAGNOSIS — C187 Malignant neoplasm of sigmoid colon: Secondary | ICD-10-CM

## 2015-10-11 DIAGNOSIS — R197 Diarrhea, unspecified: Secondary | ICD-10-CM

## 2015-10-11 LAB — COMPREHENSIVE METABOLIC PANEL
ALBUMIN: 3.5 g/dL (ref 3.5–5.0)
ALK PHOS: 76 U/L (ref 40–150)
ALT: 21 U/L (ref 0–55)
ANION GAP: 5 meq/L (ref 3–11)
AST: 27 U/L (ref 5–34)
BUN: 13.5 mg/dL (ref 7.0–26.0)
CO2: 29 meq/L (ref 22–29)
Calcium: 9 mg/dL (ref 8.4–10.4)
Chloride: 106 mEq/L (ref 98–109)
Creatinine: 0.9 mg/dL (ref 0.7–1.3)
EGFR: 81 mL/min/{1.73_m2} — ABNORMAL LOW (ref >90)
GLUCOSE: 112 mg/dL (ref 70–140)
POTASSIUM: 4.3 meq/L (ref 3.5–5.1)
SODIUM: 140 meq/L (ref 136–145)
Total Bilirubin: 1.72 mg/dL — ABNORMAL HIGH (ref 0.20–1.20)
Total Protein: 6.2 g/dL — ABNORMAL LOW (ref 6.4–8.3)

## 2015-10-11 LAB — CBC WITH DIFFERENTIAL/PLATELET
BASO%: 0.4 % (ref 0.0–2.0)
BASOS ABS: 0 10*3/uL (ref 0.0–0.1)
EOS ABS: 0.4 10*3/uL (ref 0.0–0.5)
EOS%: 6.4 % (ref 0.0–7.0)
HCT: 42.4 % (ref 38.4–49.9)
HGB: 14.2 g/dL (ref 13.0–17.1)
LYMPH%: 25.4 % (ref 14.0–49.0)
MCH: 30.9 pg (ref 27.2–33.4)
MCHC: 33.4 g/dL (ref 32.0–36.0)
MCV: 92.3 fL (ref 79.3–98.0)
MONO#: 0.6 10*3/uL (ref 0.1–0.9)
MONO%: 10.6 % (ref 0.0–14.0)
NEUT#: 3.3 10*3/uL (ref 1.5–6.5)
NEUT%: 57.2 % (ref 39.0–75.0)
PLATELETS: 172 10*3/uL (ref 140–400)
RBC: 4.59 10*6/uL (ref 4.20–5.82)
RDW: 15.3 % — ABNORMAL HIGH (ref 11.0–14.6)
WBC: 5.8 10*3/uL (ref 4.0–10.3)
lymph#: 1.5 10*3/uL (ref 0.9–3.3)

## 2015-10-11 MED ORDER — CAPECITABINE 500 MG PO TABS: ORAL_TABLET | ORAL | Status: DC

## 2015-10-11 NOTE — Progress Notes (Signed)
  [place] OFFICE PROGRESS NOTE   Diagnosis: Colon cancer  INTERVAL HISTORY:   Mr. Litt returns as scheduled. He completed a second cycle of adjuvant capecitabine beginning 09/23/2015. He had to stop this cycle about 12 days into it due to increased rash, pain when walking, and diarrhea. He continues to have some loose stools and is taking Imodium for this. His foot pain is improving, no hand pain. No mouth sores. He has noted erythematous skin lesions at the arms, neck, shoulders, and chest for the past 2 weeks.Rashes improving since stopping the Xeloda.  Objective:  Vital signs in last 24 hours:  Blood pressure 121/80, pulse 58, temperature 97.5 F (36.4 C), temperature source Oral, resp. rate 18, height 5' 10.5" (1.791 m), weight 158 lb 3.2 oz (71.759 kg), SpO2 99 %.    HEENT: No thrush or ulcers Resp: Lungs clear bilaterally Cardio: Regular rate and rhythm GI: No hepatosplenomegaly, firmness in the abdominal wall superior to the low transverse incision Vascular: No leg edema  Skin: Erythematous skin lesions over the nose, face, forearms,  shoulders, and chest . Mild changes of senile purpura at the dorsum of the hands. No palmar erythema.  Erythema and dryness of the soles. No breakdown    Lab Results:  Lab Results  Component Value Date   WBC 5.8 10/11/2015   HGB 14.2 10/11/2015   HCT 42.4 10/11/2015   MCV 92.3 10/11/2015   PLT 172 10/11/2015   NEUTROABS 3.3 10/11/2015     Medications: I have reviewed the patient's current medications.  Assessment/Plan: 1. Adenocarcinoma at 20 cm from the anal verge, most consistent with sigmoid colon cancer  Staging PET scan 07/05/2015 with no evidence of metastatic disease  Endoscopic ultrasound 06/29/2015 consistent with a T2N0 tumor  Status post a robotic low anterior resection 08/02/2015 with tumor confirmed above the peritoneal reflection,pT2 vs pT3,N1b, focally positive circumferential margin, no loss of  mismatch repair protein expression  Cycle 1 adjuvant Xeloda 09/02/2015  Cycle 2 adjuvant Xeloda 09/23/2015  Cycle 3 adjuvant Xeloda 10/14/2015; dose will be reduced to 1000 mg in the morning and 500 mg in the evening for 14 days followed by 7 days off  2. History of coronary artery disease    Disposition:  Mr. Hagemeister is having more side effects due to Xeloda. In particular he had an increased rash and loose stools with his second cycle. His Xeloda dose will be reduced to 1000 mg in the morning and 500 mg in the evening beginning with cycle 3. The erythematous lesions over the forearms are likely related to Xeloda. He will continue Imodium as needed for the frequent bowel movements. he was instructed to call us if he develops increased rash, diarrhea, or hand/foot pain with this next cycle of  Xeloda   Mr. Loescher will return for an office visit 10/31/2015.  The patient was seen and examined with Dr. Benay Spice.  Mikey Bussing, DNP, AGPCNP-BC, AOCNP  10/11/2015  2:52 PM   This was a shared visit with Mikey Bussing. Mr. Abboud was interviewed and examined. He has developed Xeloda skin toxicity. The Xeloda will be dose reduced with this cycle. He will discontinue Xeloda and contact us for hand/foot pain or an increased skin rash.  Julieanne Manson, M.D.

## 2015-10-13 ENCOUNTER — Encounter: Payer: Self-pay | Admitting: Pharmacist

## 2015-10-13 NOTE — Progress Notes (Signed)
..  Oral Chemotherapy Follow-Up Form  Original Start date of oral chemotherapy: _11/25/16__   Called patient today to follow up regarding patient's oral chemotherapy medication: _Xeloda__  Pt is doing well today. He is about to restart back on his Xeloda at a 50% reduced dose. Patient had experienced some rash as well as diarrhea and burning/pain in his feet. He saw Dr. Benay Spice on 1/3. Plan is to restart Xeloda at the end of this week (~10 days off xeloda) at dose of 1000 mg every morning and 500 mg every evening 14 days on and 7 days off (50% of original dose). All symptoms have improved per Mr. Gellert. He still has some remnants of rash on his skin but this is healing and feeling better.   Pt reports __7__ doses missed in the last week.  Missed dose(s) attributed to: _medication on hold due to toxicity at recommendation of Dr. Sherrill___________  Pt reports the following side effects: _diarrhea, rash, burning pain in feet_____    Will follow up and call patient again in _2 weeks___   Thank you,  Montel Clock, PharmD, [facility]

## 2015-10-16 ENCOUNTER — Telehealth: Payer: Self-pay | Admitting: Oncology

## 2015-10-16 NOTE — Telephone Encounter (Signed)
Left message for patient confirming 1/23 lab/fu.

## 2015-10-29 ENCOUNTER — Encounter: Payer: Self-pay | Admitting: *Deleted

## 2015-10-31 ENCOUNTER — Other Ambulatory Visit (HOSPITAL_BASED_OUTPATIENT_CLINIC_OR_DEPARTMENT_OTHER): Payer: Commercial Managed Care - HMO

## 2015-10-31 ENCOUNTER — Ambulatory Visit (HOSPITAL_BASED_OUTPATIENT_CLINIC_OR_DEPARTMENT_OTHER): Payer: Commercial Managed Care - HMO | Admitting: Nurse Practitioner

## 2015-10-31 ENCOUNTER — Telehealth: Payer: Self-pay | Admitting: Nurse Practitioner

## 2015-10-31 VITALS — BP 118/67 | HR 60 | Temp 97.5°F | Resp 18 | Ht 70.5 in | Wt 159.1 lb

## 2015-10-31 DIAGNOSIS — L271 Localized skin eruption due to drugs and medicaments taken internally: Secondary | ICD-10-CM

## 2015-10-31 DIAGNOSIS — R21 Rash and other nonspecific skin eruption: Secondary | ICD-10-CM

## 2015-10-31 DIAGNOSIS — C19 Malignant neoplasm of rectosigmoid junction: Secondary | ICD-10-CM

## 2015-10-31 DIAGNOSIS — C187 Malignant neoplasm of sigmoid colon: Secondary | ICD-10-CM

## 2015-10-31 LAB — CBC WITH DIFFERENTIAL/PLATELET
BASO%: 0.2 % (ref 0.0–2.0)
BASOS ABS: 0 10*3/uL (ref 0.0–0.1)
EOS ABS: 0.4 10*3/uL (ref 0.0–0.5)
EOS%: 7.8 % — ABNORMAL HIGH (ref 0.0–7.0)
HCT: 41.9 % (ref 38.4–49.9)
HGB: 13.9 g/dL (ref 13.0–17.1)
LYMPH%: 29.2 % (ref 14.0–49.0)
MCH: 31.4 pg (ref 27.2–33.4)
MCHC: 33.2 g/dL (ref 32.0–36.0)
MCV: 94.8 fL (ref 79.3–98.0)
MONO#: 0.5 10*3/uL (ref 0.1–0.9)
MONO%: 8.9 % (ref 0.0–14.0)
NEUT#: 3 10*3/uL (ref 1.5–6.5)
NEUT%: 53.9 % (ref 39.0–75.0)
PLATELETS: 136 10*3/uL — AB (ref 140–400)
RBC: 4.42 10*6/uL (ref 4.20–5.82)
RDW: 17.4 % — ABNORMAL HIGH (ref 11.0–14.6)
WBC: 5.6 10*3/uL (ref 4.0–10.3)
lymph#: 1.6 10*3/uL (ref 0.9–3.3)

## 2015-10-31 LAB — COMPREHENSIVE METABOLIC PANEL
ALBUMIN: 3.4 g/dL — AB (ref 3.5–5.0)
ALK PHOS: 69 U/L (ref 40–150)
ALT: 15 U/L (ref 0–55)
ANION GAP: 6 meq/L (ref 3–11)
AST: 23 U/L (ref 5–34)
BILIRUBIN TOTAL: 1.54 mg/dL — AB (ref 0.20–1.20)
BUN: 13.8 mg/dL (ref 7.0–26.0)
CO2: 28 meq/L (ref 22–29)
Calcium: 8.9 mg/dL (ref 8.4–10.4)
Chloride: 107 mEq/L (ref 98–109)
Creatinine: 0.8 mg/dL (ref 0.7–1.3)
EGFR: 83 mL/min/{1.73_m2} — AB (ref >90)
GLUCOSE: 109 mg/dL (ref 70–140)
POTASSIUM: 4 meq/L (ref 3.5–5.1)
SODIUM: 141 meq/L (ref 136–145)
Total Protein: 6.3 g/dL — ABNORMAL LOW (ref 6.4–8.3)

## 2015-10-31 NOTE — Telephone Encounter (Signed)
Scheduled patient appt per pof, avs report printed.  °

## 2015-10-31 NOTE — Progress Notes (Signed)
  [place] OFFICE PROGRESS NOTE   Diagnosis:  Colon cancer  INTERVAL HISTORY:   Mr. Semper returns as scheduled. He completed cycle 3 adjuvant Xeloda beginning 10/14/2015. He denies nausea/vomiting. No mouth sores. No diarrhea. Hands and feet are "better". Specifically less dryness, redness and pain. Skin rash has improved as well. Eyes are better but continue to be "watery and irritated".  Objective:  Vital signs in last 24 hours:  Blood pressure 118/67, pulse 60, temperature 97.5 F (36.4 C), temperature source Oral, resp. rate 18, height 5' 10.5" (1.791 m), weight 159 lb 1.6 oz (72.167 kg), SpO2 99 %.    HEENT: No thrush or ulcers. Resp: Lungs clear bilaterally. Cardio: Regular rate and rhythm. GI: Abdomen soft and nontender. No hepatomegaly. Vascular: No leg edema.  Skin: Palms with skin thickening and hyperpigmentation. No skin breakdown. Faint erythematous rash over the forearms, upper chest and upper back.    Lab Results:  Lab Results  Component Value Date   WBC 5.6 10/31/2015   HGB 13.9 10/31/2015   HCT 41.9 10/31/2015   MCV 94.8 10/31/2015   PLT 136* 10/31/2015   NEUTROABS 3.0 10/31/2015    Imaging:  No results found.  Medications: I have reviewed the patient's current medications.  Assessment/Plan: 1. Adenocarcinoma at 20 cm from the anal verge, most consistent with sigmoid colon cancer  Staging PET scan 07/05/2015 with no evidence of metastatic disease  Endoscopic ultrasound 06/29/2015 consistent with a T2N0 tumor  Status post a robotic low anterior resection 08/02/2015 with tumor confirmed above the peritoneal reflection,pT2 vs pT3,N1b, focally positive circumferential margin, no loss of mismatch repair protein expression  Cycle 1 adjuvant Xeloda 09/02/2015  Cycle 2 adjuvant Xeloda 09/23/2015  Cycle 3 adjuvant Xeloda 10/14/2015; dose reduced to 1000 mg in the morning and 500 mg in the evening for 14 days followed by 7 days off  (skin rash, hand-foot syndrome)  Cycle 4 adjuvant Xeloda 11/04/2015  2. History of coronary artery disease   Disposition: Mr. Juday appears stable. He has completed 3 cycles of adjuvant Xeloda. Xeloda dose was reduced with cycle 3 due to a skin rash and hand-foot syndrome. The rash and symptoms of hand-foot syndrome are better. He has conjunctivitis-like symptoms as well which have also improved. Plan to proceed with cycle 4 as scheduled beginning 11/04/2015. He will return for a follow-up visit in 3 weeks. He will contact the office in the interim with any problems.  Plan reviewed with Dr. Benay Spice.    Ned Card ANP/GNP-BC   10/31/2015  9:10 AM

## 2015-11-07 ENCOUNTER — Telehealth: Payer: Self-pay | Admitting: Pharmacist

## 2015-11-07 NOTE — Telephone Encounter (Signed)
11/07/15: Attempted to reach patient for follow up on oral medication: Xeloda. No answer. Left VM for patient to call back with any questions or issues.   Thank you,  Montel Clock, PharmD, [facility] [phone]

## 2015-11-21 ENCOUNTER — Other Ambulatory Visit (HOSPITAL_BASED_OUTPATIENT_CLINIC_OR_DEPARTMENT_OTHER): Payer: Commercial Managed Care - HMO

## 2015-11-21 ENCOUNTER — Ambulatory Visit (HOSPITAL_BASED_OUTPATIENT_CLINIC_OR_DEPARTMENT_OTHER): Payer: Commercial Managed Care - HMO | Admitting: Nurse Practitioner

## 2015-11-21 ENCOUNTER — Telehealth: Payer: Self-pay | Admitting: Nurse Practitioner

## 2015-11-21 VITALS — BP 121/66 | HR 53 | Temp 98.0°F | Resp 18 | Ht 70.5 in | Wt 158.6 lb

## 2015-11-21 DIAGNOSIS — C187 Malignant neoplasm of sigmoid colon: Secondary | ICD-10-CM | POA: Diagnosis not present

## 2015-11-21 DIAGNOSIS — C19 Malignant neoplasm of rectosigmoid junction: Secondary | ICD-10-CM

## 2015-11-21 LAB — COMPREHENSIVE METABOLIC PANEL
ALT: 16 U/L (ref 0–55)
AST: 23 U/L (ref 5–34)
Albumin: 3.4 g/dL — ABNORMAL LOW (ref 3.5–5.0)
Alkaline Phosphatase: 67 U/L (ref 40–150)
Anion Gap: 6 mEq/L (ref 3–11)
BUN: 14 mg/dL (ref 7.0–26.0)
CHLORIDE: 106 meq/L (ref 98–109)
CO2: 29 meq/L (ref 22–29)
CREATININE: 0.8 mg/dL (ref 0.7–1.3)
Calcium: 9.2 mg/dL (ref 8.4–10.4)
EGFR: 82 mL/min/{1.73_m2} — ABNORMAL LOW (ref >90)
GLUCOSE: 107 mg/dL (ref 70–140)
Potassium: 4.5 mEq/L (ref 3.5–5.1)
SODIUM: 141 meq/L (ref 136–145)
Total Bilirubin: 1.26 mg/dL — ABNORMAL HIGH (ref 0.20–1.20)
Total Protein: 6.3 g/dL — ABNORMAL LOW (ref 6.4–8.3)

## 2015-11-21 LAB — CBC WITH DIFFERENTIAL/PLATELET
BASO%: 0.7 % (ref 0.0–2.0)
Basophils Absolute: 0 10*3/uL (ref 0.0–0.1)
EOS%: 5.6 % (ref 0.0–7.0)
Eosinophils Absolute: 0.3 10*3/uL (ref 0.0–0.5)
HEMATOCRIT: 41.4 % (ref 38.4–49.9)
HGB: 13.9 g/dL (ref 13.0–17.1)
LYMPH#: 1.5 10*3/uL (ref 0.9–3.3)
LYMPH%: 26.9 % (ref 14.0–49.0)
MCH: 32.3 pg (ref 27.2–33.4)
MCHC: 33.6 g/dL (ref 32.0–36.0)
MCV: 96.3 fL (ref 79.3–98.0)
MONO#: 0.5 10*3/uL (ref 0.1–0.9)
MONO%: 9.8 % (ref 0.0–14.0)
NEUT#: 3.1 10*3/uL (ref 1.5–6.5)
NEUT%: 57 % (ref 39.0–75.0)
Platelets: 144 10*3/uL (ref 140–400)
RBC: 4.3 10*6/uL (ref 4.20–5.82)
RDW: 20.5 % — ABNORMAL HIGH (ref 11.0–14.6)
WBC: 5.4 10*3/uL (ref 4.0–10.3)

## 2015-11-21 NOTE — Telephone Encounter (Signed)
per pof to sch pt appt-gave pt copy of avs °

## 2015-11-21 NOTE — Progress Notes (Addendum)
  [name] OFFICE PROGRESS NOTE   Diagnosis:  Colon cancer  INTERVAL HISTORY:   Keith [name] returns as scheduled. He completed cycle 4 adjuvant Xeloda beginning 11/04/2015. He denies nausea/vomiting. No mouth sores. No diarrhea. No hand or foot pain. He notes that the palms are mildly red. Skin on the soles of the feet beginning to peel.  Objective:  Vital signs in last 24 hours:  Blood pressure 121/66, pulse 53, temperature 98 F (36.7 C), temperature source Oral, resp. rate 18, height 5' 10.5" (1.791 m), weight 158 lb 9.6 oz (71.94 kg), SpO2 99 %.    HEENT:  No thrush or ulcers. Resp:  Lungs clear bilaterally. Cardio:  Regular rate and rhythm. GI:  Abdomen soft and nontender. No hepatomegaly. Vascular:  No leg edema. Skin:  Palms with mild hyperpigmentation. No skin breakdown. Soles with mild erythema and scattered areas of peeling skin, mainly at the heels.    Lab Results:  Lab Results  Component Value Date   WBC 5.4 11/21/2015   HGB 13.9 11/21/2015   HCT 41.4 11/21/2015   MCV 96.3 11/21/2015   PLT 144 11/21/2015   NEUTROABS 3.1 11/21/2015    Imaging:  No results found.  Medications: I have reviewed the patient's current medications.  Assessment/Plan: 1. Adenocarcinoma at 20 cm from the anal verge, most consistent with sigmoid colon cancer  Staging PET scan 07/05/2015 with no evidence of metastatic disease  Endoscopic ultrasound 06/29/2015 consistent with a T2N0 tumor  Status post a robotic low anterior resection 08/02/2015 with tumor confirmed above the peritoneal reflection,pT2 vs pT3,N1b, focally positive circumferential margin, no loss of mismatch repair protein expression  Cycle 1 adjuvant Xeloda 09/02/2015  Cycle 2 adjuvant Xeloda 09/23/2015  Cycle 3 adjuvant Xeloda 10/14/2015; dose reduced to 1000 mg in the morning and 500 mg in the evening for 14 days followed by 7 days off (skin rash, hand-foot syndrome)  Cycle 4 adjuvant Xeloda  11/04/2015  Cycle 5 adjuvant Xeloda 11/25/2015  2. History of coronary artery disease   Disposition: Keith [name] appears stable. He has completed 4 cycles of adjuvant Xeloda. Plan to proceed with cycle 5 as scheduled beginning  11/25/2015. He will return for a follow-up visit in approximately 3 weeks. He will contact the office in the interim with any problems. We specifically discussed hand/foot pain.  Patient seen with Dr. Benay Spice.    Ned Card ANP/GNP-BC   11/21/2015  10:22 AM  This was a shared visit with Ned Card. Keith [name] was interviewed and examined. He will begin cycle 5 adjuvant Xeloda this week.  Julieanne Manson, M.D.

## 2015-12-02 ENCOUNTER — Encounter: Payer: Self-pay | Admitting: Internal Medicine

## 2015-12-02 ENCOUNTER — Ambulatory Visit (INDEPENDENT_AMBULATORY_CARE_PROVIDER_SITE_OTHER): Payer: Commercial Managed Care - HMO | Admitting: Internal Medicine

## 2015-12-02 VITALS — BP 124/62 | HR 64 | Temp 97.5°F | Resp 16 | Ht 71.5 in | Wt 158.6 lb

## 2015-12-02 DIAGNOSIS — E559 Vitamin D deficiency, unspecified: Secondary | ICD-10-CM | POA: Diagnosis not present

## 2015-12-02 DIAGNOSIS — K219 Gastro-esophageal reflux disease without esophagitis: Secondary | ICD-10-CM | POA: Diagnosis not present

## 2015-12-02 DIAGNOSIS — R7303 Prediabetes: Secondary | ICD-10-CM

## 2015-12-02 DIAGNOSIS — E785 Hyperlipidemia, unspecified: Secondary | ICD-10-CM

## 2015-12-02 DIAGNOSIS — Z79899 Other long term (current) drug therapy: Secondary | ICD-10-CM | POA: Diagnosis not present

## 2015-12-02 DIAGNOSIS — I10 Essential (primary) hypertension: Secondary | ICD-10-CM

## 2015-12-02 LAB — HEMOGLOBIN A1C
Hgb A1c MFr Bld: 5.7 % — ABNORMAL HIGH (ref <5.7)
MEAN PLASMA GLUCOSE: 117 mg/dL — AB (ref <117)

## 2015-12-02 LAB — LIPID PANEL
Cholesterol: 128 mg/dL (ref 125–200)
HDL: 47 mg/dL (ref >=40)
LDL CALC: 58 mg/dL (ref <130)
Total CHOL/HDL Ratio: 2.7 Ratio (ref <=5.0)
Triglycerides: 114 mg/dL (ref <150)
VLDL: 23 mg/dL (ref <30)

## 2015-12-02 LAB — MAGNESIUM: Magnesium: 1.8 mg/dL (ref 1.5–2.5)

## 2015-12-02 NOTE — Progress Notes (Signed)
Patient ID: Keith [name], male   DOB: Nov 23, 1931, 82 y.o.   MRN: [id number]   This very nice 82 y.o. MWM presents for 6 month follow up with Hypertension, Hyperlipidemia, Pre-Diabetes and Vitamin D Deficiency. Patient is s/p Resection of a Colon Ca w/2(+) of 26 LNs in Oct 2016 by Dr Barry Dienes and is currently on 5th of 8 three week cycles of Xeloda followed closely by Dr Julieanne Manson. Patient apparently tolerating the Chemotx very well.    Patient is monitored expectantly for labile HTN & BP has been controlled and today's BP: 124/62 mmHg.  Patient has hx/o ASCAD s/p PTCA in 1981 and has done well since . In 2008 he had a negative Cardiolite w/EF 86%. Patient has had no complaints of any cardiac type chest pain, palpitations, dyspnea/orthopnea/PND, dizziness, claudication, or dependent edema.   Hyperlipidemia is controlled with diet & meds. Patient denies myalgias or other med SE's. Last Lipids were 05/06/2015: Cholesterol 142; HDL 45; LDL Cholesterol 74; Triglycerides 116 on    Also, the patient has history of PreDiabetes with A1c 5.9% in Feb 2014 and has had no symptoms of reactive hypoglycemia, diabetic polys, paresthesias or visual blurring.  Last A1c was  5.8% on 07/28/2015.    Further, the patient also has history of Vitamin D Deficiency of "27" in 2009  and supplements vitamin D without any suspected side-effects. Last vitamin D was 51 on 05/06/2015.    Medication Sig  . aspirin 81 MG tablet Take 81 mg by mouth every morning.   Marland Kitchen atorvastatin (LIPITOR) 40 MG tablet TAKE 1 TABLET EVERY DAY (Patient taking differently: TAKE 1 TABLET BY MOUTH NIGHTLY.)  . capecitabine (XELODA) 500 MG tablet TAKE 2 TAB(1000 MG) BY MOUTH AM AFTER A MEAL AND 1 TAB (500 MG) IN THE PM AFTER A MEAL FOR 14 DAYS, THEN 7 DAYS OFF. REPEAT EVERY 21 DAYS.  Marland Kitchen Cholecalciferol (VITAMIN D PO) Take 2,000 Int'l Units by mouth every morning.   . loperamide (IMODIUM) 2 MG capsule Take 2 mg by mouth as needed for diarrhea or loose  stools.  . Magnesium 250 MG TABS Take 1 tablet by mouth every morning.   . Omega-3 Fatty Acids (FISH OIL PO) Take 1 tablet by mouth every morning.   Marland Kitchen oxyCODONE-acetaminophen (PERCOCET/ROXICET) 5-325 MG tablet Take 1-2 tablets by mouth every 4 (four) hours as needed for moderate pain (once taking PO). (Patient not taking: Reported on 09/15/2015)  . ranitidine (ZANTAC) 300 MG tablet Take 1 tablet (300 mg total) by mouth 2 (two) times daily. For indigestion and acid reflux   Allergies  Allergen Reactions  . Penicillins    PMHx:   Past Medical History  Diagnosis Date  . Hypertension   . Hyperlipidemia   . GERD (gastroesophageal reflux disease)   . Vitamin D deficiency   . Elevated PSA   . Pneumonia     age 82  . Cancer ([place])     cancer of recto-sigmoid junction-dx 07/2015  . Hemorrhoids    Immunization History  Administered Date(s) Administered  . Influenza Split 07/29/2013, 06/22/2015  . Influenza, High Dose Seasonal PF 06/15/2014  . Pneumococcal Conjugate-13 06/15/2014  . Td 02/09/2010  . Zoster 10/08/2000   Past Surgical History  Procedure Laterality Date  . Eus N/A 06/29/2015    Procedure: LOWER ENDOSCOPIC ULTRASOUND (EUS);  Surgeon: Arta Silence, MD;  Location: [facility];  Service: Endoscopy;  Laterality: N/A;  . Hernia repair      left inguinal  .  Xi robotic assisted lower anterior resection N/A 08/02/2015    Procedure: XI ROBOTIC ASSISTED LOWER ANTERIOR RESECTION;  Surgeon: Stark Klein, MD;  Location: [facility];  Service: General;  Laterality: N/A;   FHx:    Reviewed / unchanged  SHx:    Reviewed / unchanged  Systems Review:  Constitutional: Denies fever, chills, wt changes, headaches, insomnia, fatigue, night sweats, change in appetite. Eyes: Denies redness, blurred vision, diplopia, discharge, itchy, watery eyes.  ENT: Denies discharge, congestion, post nasal drip, epistaxis, sore throat, earache, hearing loss, dental pain, tinnitus, vertigo, sinus pain,  snoring.  CV: Denies chest pain, palpitations, irregular heartbeat, syncope, dyspnea, diaphoresis, orthopnea, PND, claudication or edema. Respiratory: denies cough, dyspnea, DOE, pleurisy, hoarseness, laryngitis, wheezing.  Gastrointestinal: Denies dysphagia, odynophagia, heartburn, reflux, water brash, abdominal pain or cramps, nausea, vomiting, bloating, diarrhea, constipation, hematemesis, melena, hematochezia  or hemorrhoids. Genitourinary: Denies dysuria, frequency, urgency, nocturia, hesitancy, discharge, hematuria or flank pain. Musculoskeletal: Denies arthralgias, myalgias, stiffness, jt. swelling, pain, limping or strain/sprain.  Skin: Denies pruritus, rash, hives, warts, acne, eczema or change in skin lesion(s). Neuro: No weakness, tremor, incoordination, spasms, paresthesia or pain. Psychiatric: Denies confusion, memory loss or sensory loss. Endo: Denies change in weight, skin or hair change.  Heme/Lymph: No excessive bleeding, bruising or enlarged lymph nodes.  Physical Exam  BP 124/62 mmHg  Pulse 64  Temp(Src) 97.5 F (36.4 C)  Resp 16  Ht 5' 11.5" (1.816 m)  Wt 158 lb 9.6 oz (71.94 kg)  BMI 21.81 kg/m2  Appears well nourished and in no distress. Eyes: PERRLA, EOMs, conjunctiva no swelling or erythema. Sinuses: No frontal/maxillary tenderness ENT/Mouth: EAC's clear, TM's nl w/o erythema, bulging. Nares clear w/o erythema, swelling, exudates. Oropharynx clear without erythema or exudates. Oral hygiene is good. Tongue normal, non obstructing. Hearing intact.  Neck: Supple. Thyroid nl. Car 2+/2+ without bruits, nodes or JVD. Chest: Respirations nl with BS clear & equal w/o rales, rhonchi, wheezing or stridor.  Cor: Heart sounds normal w/ regular rate and rhythm without sig. murmurs, gallops, clicks, or rubs. Peripheral pulses normal and equal  without edema.  Abdomen: Soft & bowel sounds normal. Non-tender w/o guarding, rebound, hernias, masses, or organomegaly.  Lymphatics:  Unremarkable.  Musculoskeletal: Full ROM all peripheral extremities, joint stability, 5/5 strength, and normal gait.  Skin: Warm, dry without exposed rashes, lesions or ecchymosis apparent.  Neuro: Cranial nerves intact, reflexes equal bilaterally. Sensory-motor testing grossly intact. Tendon reflexes grossly intact.  Pysch: Alert & oriented x 3.  Insight and judgement nl & appropriate. No ideations.  Assessment and Plan:  1. Essential hypertension  - TSH  2. Hyperlipidemia  - Lipid panel - TSH  3. Prediabetes  - Hemoglobin A1c - Insulin, random  4. Vitamin D deficiency  - VITAMIN D 25 Hydroxy   5. Gastroesophageal reflux disease   6. Medication management  - Magnesium   Recommended regular exercise, BP monitoring, weight control, and discussed med and SE's. Recommended labs to assess and monitor clinical status. Further disposition pending results of labs. Over 30 minutes of exam, counseling, chart review was performed

## 2015-12-02 NOTE — Patient Instructions (Signed)

## 2015-12-03 LAB — VITAMIN D 25 HYDROXY (VIT D DEFICIENCY, FRACTURES): Vit D, 25-Hydroxy: 36 ng/mL (ref 30–100)

## 2015-12-03 LAB — TSH: TSH: 1.28 mIU/L (ref 0.40–4.50)

## 2015-12-05 LAB — INSULIN, RANDOM: INSULIN: 70.2 u[IU]/mL — AB (ref 2.0–19.6)

## 2015-12-07 ENCOUNTER — Encounter: Payer: Self-pay | Admitting: Pharmacist

## 2015-12-07 NOTE — Progress Notes (Signed)
Oral Chemotherapy Follow-Up Form  Original Start date of oral chemotherapy: _11/25/16__   Called patient today to follow up regarding patient's oral chemotherapy medication: _Xeloda__  Pt is doing well today. Xeloda has been reduced to 1000 mg in the am and 500 mg in the evening 14 days on and 7 days off. He is doing much better. No side effects to report.   Pt reports _0_ doses missed in the last week.   Pt reports the following side effects: _burning pain improved____    Will follow up and call patient again in _4 weeks___   Thank you,  Montel Clock, PharmD, [facility]

## 2015-12-13 ENCOUNTER — Telehealth: Payer: Self-pay | Admitting: *Deleted

## 2015-12-13 ENCOUNTER — Telehealth: Payer: Self-pay | Admitting: Nurse Practitioner

## 2015-12-13 ENCOUNTER — Telehealth: Payer: Self-pay | Admitting: Oncology

## 2015-12-13 ENCOUNTER — Ambulatory Visit (HOSPITAL_BASED_OUTPATIENT_CLINIC_OR_DEPARTMENT_OTHER): Payer: Commercial Managed Care - HMO | Admitting: Oncology

## 2015-12-13 ENCOUNTER — Other Ambulatory Visit (HOSPITAL_BASED_OUTPATIENT_CLINIC_OR_DEPARTMENT_OTHER): Payer: Commercial Managed Care - HMO

## 2015-12-13 VITALS — BP 110/59 | HR 54 | Temp 97.6°F | Resp 18 | Ht 71.56 in | Wt 158.1 lb

## 2015-12-13 DIAGNOSIS — C187 Malignant neoplasm of sigmoid colon: Secondary | ICD-10-CM | POA: Diagnosis not present

## 2015-12-13 LAB — CBC WITH DIFFERENTIAL/PLATELET
BASO%: 0.5 % (ref 0.0–2.0)
Basophils Absolute: 0 10*3/uL (ref 0.0–0.1)
EOS%: 4.4 % (ref 0.0–7.0)
Eosinophils Absolute: 0.2 10*3/uL (ref 0.0–0.5)
HCT: 43 % (ref 38.4–49.9)
HEMOGLOBIN: 14.2 g/dL (ref 13.0–17.1)
LYMPH%: 35.3 % (ref 14.0–49.0)
MCH: 32.6 pg (ref 27.2–33.4)
MCHC: 33 g/dL (ref 32.0–36.0)
MCV: 98.8 fL — ABNORMAL HIGH (ref 79.3–98.0)
MONO#: 0.5 10*3/uL (ref 0.1–0.9)
MONO%: 9.7 % (ref 0.0–14.0)
NEUT%: 50.1 % (ref 39.0–75.0)
NEUTROS ABS: 2.4 10*3/uL (ref 1.5–6.5)
Platelets: 157 10*3/uL (ref 140–400)
RBC: 4.35 10*6/uL (ref 4.20–5.82)
RDW: 19.9 % — AB (ref 11.0–14.6)
WBC: 4.7 10*3/uL (ref 4.0–10.3)
lymph#: 1.7 10*3/uL (ref 0.9–3.3)

## 2015-12-13 LAB — COMPREHENSIVE METABOLIC PANEL
ALBUMIN: 3.7 g/dL (ref 3.5–5.0)
ALK PHOS: 74 U/L (ref 40–150)
ALT: 16 U/L (ref 0–55)
AST: 27 U/L (ref 5–34)
Anion Gap: 8 mEq/L (ref 3–11)
BILIRUBIN TOTAL: 1.91 mg/dL — AB (ref 0.20–1.20)
BUN: 13.3 mg/dL (ref 7.0–26.0)
CO2: 27 mEq/L (ref 22–29)
Calcium: 9.3 mg/dL (ref 8.4–10.4)
Chloride: 106 mEq/L (ref 98–109)
Creatinine: 0.8 mg/dL (ref 0.7–1.3)
EGFR: 83 mL/min/{1.73_m2} — ABNORMAL LOW (ref >90)
GLUCOSE: 98 mg/dL (ref 70–140)
POTASSIUM: 4.5 meq/L (ref 3.5–5.1)
SODIUM: 141 meq/L (ref 136–145)
TOTAL PROTEIN: 6.7 g/dL (ref 6.4–8.3)

## 2015-12-13 NOTE — Telephone Encounter (Signed)
Returned call to patient re change time of 3/28 lab/fu due to conflict with Dr. Barry Dienes appointment. Spoke with patient re new time for lab/fu 3/28 @ 2:15 pm.

## 2015-12-13 NOTE — Progress Notes (Signed)
  [place] OFFICE PROGRESS NOTE   Diagnosis:  Colon cancer  INTERVAL HISTORY:    Keith [name] returns as scheduled. He began another cycle of Xeloda on 11/25/2015. No mouth sores, nausea, or diarrhea. Out soreness at the soles.  Objective:  Vital signs in last 24 hours:  Blood pressure 110/59, pulse 54, temperature 97.6 F (36.4 C), temperature source Oral, resp. rate 18, height 5' 11.56" (1.818 m), weight 158 lb 1.6 oz (71.714 kg), SpO2 98 %.    HEENT:  No thrush or ulcers Resp:  Lungs clear bilaterally Cardio:  Regular rate and rhythm GI:  No hepatosplenomegaly, nontender Vascular:  No leg edema  Skin: mild erythema at the palms, erythema at the soles with superficial desquamation. No discrete ulcer.     Lab Results:  Lab Results  Component Value Date   WBC 4.7 12/13/2015   HGB 14.2 12/13/2015   HCT 43.0 12/13/2015   MCV 98.8* 12/13/2015   PLT 157 12/13/2015   NEUTROABS 2.4 12/13/2015    creatinine 0.8, bilirubin 1.91  Medications: I have reviewed the patient's current medications.  Assessment/Plan: 1. Adenocarcinoma at 20 cm from the anal verge, most consistent with sigmoid colon cancer  Staging PET scan 07/05/2015 with no evidence of metastatic disease  Endoscopic ultrasound 06/29/2015 consistent with a T2N0 tumor  Status post a robotic low anterior resection 08/02/2015 with tumor confirmed above the peritoneal reflection,pT2 vs pT3,N1b, focally positive circumferential margin, no loss of mismatch repair protein expression  Cycle 1 adjuvant Xeloda 09/02/2015  Cycle 2 adjuvant Xeloda 09/23/2015  Cycle 3 adjuvant Xeloda 10/14/2015; dose reduced to 1000 mg in the morning and 500 mg in the evening for 14 days followed by 7 days off (skin rash, hand-foot syndrome)  Cycle 4 adjuvant Xeloda 11/04/2015  Cycle 5 adjuvant Xeloda 11/25/2015   cycle 6 adjuvant Xeloda 12/16/2015  2. History of coronary artery disease  3.   Mild  hyperbilirubinemia -chronic, Gilberts?     Disposition:   Keith [name] appears stable. He will begin cycle 6 adjuvant Xeloda on 12/16/2015. He will return for an office and lab visit in 3 weeks.  [name], MD  12/13/2015  11:16 AM

## 2015-12-13 NOTE — Telephone Encounter (Signed)
Gave patient avs report and appointments for March  °

## 2015-12-13 NOTE — Telephone Encounter (Signed)
"  Next scheduled F/U on [phone] conflicts with appointment with Dr. Barry Dienes at 11:00 am.  Could this appointment be changed to later this day or the next day?"  Scheduler notified and will call patient.

## 2015-12-30 ENCOUNTER — Telehealth: Payer: Self-pay | Admitting: *Deleted

## 2015-12-30 NOTE — Telephone Encounter (Signed)
Pt call received in TRIAGE stating he will be needing a refill soon on the capecitibine.  Per his account and understanding of treatment regimen he states he will only need a total of 3 weeks of medication ( he has enough at present for 1 week ).  " I take it for 2 weeks then off a week and I only have two more sessions "  Pharmacy verified as [facility] mail order.  This note will be forwarded to MD and RN for appropriate refill.

## 2016-01-03 ENCOUNTER — Ambulatory Visit (HOSPITAL_BASED_OUTPATIENT_CLINIC_OR_DEPARTMENT_OTHER): Payer: Commercial Managed Care - HMO | Admitting: Nurse Practitioner

## 2016-01-03 ENCOUNTER — Other Ambulatory Visit (HOSPITAL_BASED_OUTPATIENT_CLINIC_OR_DEPARTMENT_OTHER): Payer: Commercial Managed Care - HMO

## 2016-01-03 ENCOUNTER — Other Ambulatory Visit: Payer: Self-pay | Admitting: Nurse Practitioner

## 2016-01-03 ENCOUNTER — Telehealth: Payer: Self-pay | Admitting: Nurse Practitioner

## 2016-01-03 VITALS — BP 121/53 | HR 54 | Temp 97.6°F | Resp 18 | Wt 158.3 lb

## 2016-01-03 DIAGNOSIS — C187 Malignant neoplasm of sigmoid colon: Secondary | ICD-10-CM

## 2016-01-03 LAB — COMPREHENSIVE METABOLIC PANEL
ALBUMIN: 3.4 g/dL — AB (ref 3.5–5.0)
ALT: 15 U/L (ref 0–55)
ANION GAP: 5 meq/L (ref 3–11)
AST: 22 U/L (ref 5–34)
Alkaline Phosphatase: 61 U/L (ref 40–150)
BUN: 14.1 mg/dL (ref 7.0–26.0)
CO2: 30 mEq/L — ABNORMAL HIGH (ref 22–29)
Calcium: 9 mg/dL (ref 8.4–10.4)
Chloride: 106 mEq/L (ref 98–109)
Creatinine: 0.9 mg/dL (ref 0.7–1.3)
EGFR: 81 mL/min/{1.73_m2} — AB (ref >90)
Glucose: 93 mg/dl (ref 70–140)
POTASSIUM: 4.9 meq/L (ref 3.5–5.1)
Sodium: 141 mEq/L (ref 136–145)
TOTAL PROTEIN: 6.3 g/dL — AB (ref 6.4–8.3)
Total Bilirubin: 1.46 mg/dL — ABNORMAL HIGH (ref 0.20–1.20)

## 2016-01-03 LAB — CBC WITH DIFFERENTIAL/PLATELET
BASO%: 0.4 % (ref 0.0–2.0)
BASOS ABS: 0 10*3/uL (ref 0.0–0.1)
EOS ABS: 0.3 10*3/uL (ref 0.0–0.5)
EOS%: 6.5 % (ref 0.0–7.0)
HCT: 40.3 % (ref 38.4–49.9)
HEMOGLOBIN: 13.6 g/dL (ref 13.0–17.1)
LYMPH%: 32.5 % (ref 14.0–49.0)
MCH: 33.3 pg (ref 27.2–33.4)
MCHC: 33.7 g/dL (ref 32.0–36.0)
MCV: 98.8 fL — AB (ref 79.3–98.0)
MONO#: 0.4 10*3/uL (ref 0.1–0.9)
MONO%: 8.3 % (ref 0.0–14.0)
NEUT%: 52.3 % (ref 39.0–75.0)
NEUTROS ABS: 2.6 10*3/uL (ref 1.5–6.5)
PLATELETS: 147 10*3/uL (ref 140–400)
RBC: 4.08 10*6/uL — AB (ref 4.20–5.82)
RDW: 16.1 % — ABNORMAL HIGH (ref 11.0–14.6)
WBC: 5 10*3/uL (ref 4.0–10.3)
lymph#: 1.6 10*3/uL (ref 0.9–3.3)

## 2016-01-03 MED ORDER — CAPECITABINE 500 MG PO TABS: ORAL_TABLET | ORAL | Status: DC

## 2016-01-03 NOTE — Telephone Encounter (Signed)
per pof to sch pt appt-gave pt copy of avs °

## 2016-01-03 NOTE — Progress Notes (Signed)
  [place] OFFICE PROGRESS NOTE   Diagnosis:  Colon cancer   INTERVAL HISTORY:   Mr. Keith [name] returns as scheduled. He completed cycle 6 adjuvant Xeloda beginning 12/16/2015. He denies nausea/vomiting. No mouth sores. No diarrhea. He notes the palms and soles are mildly erythematous. No skin breakdown and no pain.  Objective:  Vital signs in last 24 hours:  Blood pressure 121/53, pulse 54, temperature 97.6 F (36.4 C), temperature source Oral, resp. rate 18, weight 158 lb 4.8 oz (71.804 kg), SpO2 99 %.    HEENT: No thrush or ulcers. Resp: Lungs clear bilaterally. Cardio: Regular rate and rhythm. GI: Abdomen soft and nontender. No hepatomegaly. Vascular: No leg edema.  Skin: Palms with skin thickening, no erythema and no skin breakdown. Soles with mild erythema, dryness.   Lab Results:  Lab Results  Component Value Date   WBC 5.0 01/03/2016   HGB 13.6 01/03/2016   HCT 40.3 01/03/2016   MCV 98.8* 01/03/2016   PLT 147 01/03/2016   NEUTROABS 2.6 01/03/2016    Imaging:  No results found.  Medications: I have reviewed the patient's current medications.  Assessment/Plan: 1. Adenocarcinoma at 20 cm from the anal verge, most consistent with sigmoid colon cancer  Staging PET scan 07/05/2015 with no evidence of metastatic disease  Endoscopic ultrasound 06/29/2015 consistent with a T2N0 tumor  Status post a robotic low anterior resection 08/02/2015 with tumor confirmed above the peritoneal reflection, pT2 vs pT3,N1b, focally positive circumferential margin, no loss of mismatch repair protein expression  Cycle 1 adjuvant Xeloda 09/02/2015  Cycle 2 adjuvant Xeloda 09/23/2015  Cycle 3 adjuvant Xeloda 10/14/2015; dose reduced to 1000 mg in the morning and 500 mg in the evening for 14 days followed by 7 days off (skin rash, hand-foot syndrome)  Cycle 4 adjuvant Xeloda 11/04/2015  Cycle 5 adjuvant Xeloda 11/25/2015  Cycle 6 adjuvant Xeloda  12/16/2015  Cycle 7 adjuvant Xeloda 01/06/2016  2. History of coronary artery disease  3. Mild hyperbilirubinemia -chronic, Gilberts?    Disposition: Keith [name] appears stable. He has completed 6 cycles of adjuvant Xeloda. Plan to proceed with cycle 7 beginning 01/06/2016. He will return for a follow-up visit in 3 weeks. He will contact the office in the interim with any problems.    Keith [name] ANP/GNP-BC   01/03/2016  2:38 PM

## 2016-01-09 ENCOUNTER — Telehealth: Payer: Self-pay

## 2016-01-09 NOTE — Telephone Encounter (Signed)
Pt. Received Xeloda on Friday 01/06/16 confirmed by Pt.'s wife

## 2016-01-24 ENCOUNTER — Other Ambulatory Visit (HOSPITAL_BASED_OUTPATIENT_CLINIC_OR_DEPARTMENT_OTHER): Payer: Commercial Managed Care - HMO

## 2016-01-24 ENCOUNTER — Ambulatory Visit (HOSPITAL_BASED_OUTPATIENT_CLINIC_OR_DEPARTMENT_OTHER): Payer: Commercial Managed Care - HMO | Admitting: Nurse Practitioner

## 2016-01-24 ENCOUNTER — Other Ambulatory Visit: Payer: Self-pay

## 2016-01-24 VITALS — BP 116/50 | HR 54 | Resp 18 | Ht 71.0 in | Wt 158.4 lb

## 2016-01-24 DIAGNOSIS — C187 Malignant neoplasm of sigmoid colon: Secondary | ICD-10-CM

## 2016-01-24 LAB — COMPREHENSIVE METABOLIC PANEL
ALBUMIN: 3.5 g/dL (ref 3.5–5.0)
ALT: 12 U/L (ref 0–55)
ANION GAP: 5 meq/L (ref 3–11)
AST: 22 U/L (ref 5–34)
Alkaline Phosphatase: 60 U/L (ref 40–150)
BUN: 13.6 mg/dL (ref 7.0–26.0)
CALCIUM: 9.2 mg/dL (ref 8.4–10.4)
CHLORIDE: 107 meq/L (ref 98–109)
CO2: 30 meq/L — AB (ref 22–29)
CREATININE: 0.8 mg/dL (ref 0.7–1.3)
EGFR: 84 mL/min/{1.73_m2} — ABNORMAL LOW (ref >90)
GLUCOSE: 103 mg/dL (ref 70–140)
POTASSIUM: 4.5 meq/L (ref 3.5–5.1)
Sodium: 142 mEq/L (ref 136–145)
Total Bilirubin: 1.43 mg/dL — ABNORMAL HIGH (ref 0.20–1.20)
Total Protein: 6.5 g/dL (ref 6.4–8.3)

## 2016-01-24 LAB — CBC WITH DIFFERENTIAL/PLATELET
BASO%: 0.5 % (ref 0.0–2.0)
BASOS ABS: 0 10*3/uL (ref 0.0–0.1)
EOS ABS: 0.2 10*3/uL (ref 0.0–0.5)
EOS%: 4.5 % (ref 0.0–7.0)
HEMATOCRIT: 42.9 % (ref 38.4–49.9)
HEMOGLOBIN: 14.1 g/dL (ref 13.0–17.1)
LYMPH#: 1.7 10*3/uL (ref 0.9–3.3)
LYMPH%: 29.7 % (ref 14.0–49.0)
MCH: 33.3 pg (ref 27.2–33.4)
MCHC: 32.8 g/dL (ref 32.0–36.0)
MCV: 101.5 fL — AB (ref 79.3–98.0)
MONO#: 0.5 10*3/uL (ref 0.1–0.9)
MONO%: 8.6 % (ref 0.0–14.0)
NEUT#: 3.2 10*3/uL (ref 1.5–6.5)
NEUT%: 56.7 % (ref 39.0–75.0)
PLATELETS: 153 10*3/uL (ref 140–400)
RBC: 4.23 10*6/uL (ref 4.20–5.82)
RDW: 17.6 % — AB (ref 11.0–14.6)
WBC: 5.6 10*3/uL (ref 4.0–10.3)

## 2016-01-24 MED ORDER — CAPECITABINE 500 MG PO TABS: ORAL_TABLET | ORAL | Status: DC

## 2016-01-24 NOTE — Progress Notes (Signed)
  [place] OFFICE PROGRESS NOTE   Diagnosis:  Colon cancer  INTERVAL HISTORY:   Keith [name] returns as scheduled. He completed cycle 7 adjuvant Xeloda beginning 01/06/2016. He denies nausea/vomiting. No mouth sores. No diarrhea. No hand pain. A few days he noted that his feet became sore toward the end of a "long walk". The soreness is better.  Objective:  Vital signs in last 24 hours:  Blood pressure 116/50, pulse 54, resp. rate 18, height 5' 11" (1.803 m), weight 158 lb 6.4 oz (71.85 kg), SpO2 100 %.    HEENT: No thrush or ulcers. Resp: Lungs clear bilaterally. Cardio: Regular rate and rhythm. GI: Abdomen soft and nontender. No hepatomegaly. Vascular: No leg edema. Skin: Palms with skin thickening, no erythema or skin breakdown. Soles with mild erythema. No skin breakdown.    Lab Results:  Lab Results  Component Value Date   WBC 5.6 01/24/2016   HGB 14.1 01/24/2016   HCT 42.9 01/24/2016   MCV 101.5* 01/24/2016   PLT 153 01/24/2016   NEUTROABS 3.2 01/24/2016    Imaging:  No results found.  Medications: I have reviewed the patient's current medications.  Assessment/Plan: 1. Adenocarcinoma at 20 cm from the anal verge, most consistent with sigmoid colon cancer  Staging PET scan 07/05/2015 with no evidence of metastatic disease  Endoscopic ultrasound 06/29/2015 consistent with a T2N0 tumor  Status post a robotic low anterior resection 08/02/2015 with tumor confirmed above the peritoneal reflection, pT2 vs pT3,N1b, focally positive circumferential margin, no loss of mismatch repair protein expression  Cycle 1 adjuvant Xeloda 09/02/2015  Cycle 2 adjuvant Xeloda 09/23/2015  Cycle 3 adjuvant Xeloda 10/14/2015; dose reduced to 1000 mg in the morning and 500 mg in the evening for 14 days followed by 7 days off (skin rash, hand-foot syndrome)  Cycle 4 adjuvant Xeloda 11/04/2015  Cycle 5 adjuvant Xeloda 11/25/2015  Cycle 6 adjuvant Xeloda  12/16/2015  Cycle 7 adjuvant Xeloda 01/06/2016  Cycle 8 adjuvant Xeloda 01/27/2016  2. History of coronary artery disease  3. Mild hyperbilirubinemia -chronic, Gilberts?    Disposition: Keith [name] appears stable. He has completed 7 cycles of adjuvant Xeloda. Plan to proceed with the eighth and final cycle beginning 01/27/2016. He will return for a follow-up visit in approximately one month. He will contact the office in the interim with any problems.   Ned Card ANP/GNP-BC   01/24/2016  10:38 AM

## 2016-01-25 LAB — CEA (PARALLEL TESTING): CEA: 1.1 ng/mL

## 2016-01-25 LAB — CEA: CEA1: 2.1 ng/mL (ref 0.0–4.7)

## 2016-01-27 ENCOUNTER — Telehealth: Payer: Self-pay | Admitting: *Deleted

## 2016-01-27 MED ORDER — CAPECITABINE 500 MG PO TABS: ORAL_TABLET | ORAL | Status: DC

## 2016-01-27 NOTE — Telephone Encounter (Signed)
Call from pt reporting he called [employer], they have not received Xeloda Rx. Pt needs 21 tablets to complete final cycle. Rx sent electronically.

## 2016-01-31 ENCOUNTER — Telehealth: Payer: Self-pay

## 2016-01-31 ENCOUNTER — Other Ambulatory Visit: Payer: Self-pay

## 2016-01-31 NOTE — Telephone Encounter (Signed)
Patient calling wanting to change his appt with Dr. Benay Spice from 02/20/16 to 02/21/16. POF placed for appt change.

## 2016-02-20 ENCOUNTER — Ambulatory Visit: Payer: Commercial Managed Care - HMO | Admitting: Oncology

## 2016-02-20 ENCOUNTER — Other Ambulatory Visit: Payer: Commercial Managed Care - HMO

## 2016-02-22 ENCOUNTER — Other Ambulatory Visit: Payer: Self-pay | Admitting: *Deleted

## 2016-02-22 DIAGNOSIS — C187 Malignant neoplasm of sigmoid colon: Secondary | ICD-10-CM

## 2016-02-23 ENCOUNTER — Other Ambulatory Visit (HOSPITAL_BASED_OUTPATIENT_CLINIC_OR_DEPARTMENT_OTHER): Payer: Commercial Managed Care - HMO

## 2016-02-23 ENCOUNTER — Telehealth: Payer: Self-pay | Admitting: *Deleted

## 2016-02-23 ENCOUNTER — Telehealth: Payer: Self-pay | Admitting: Oncology

## 2016-02-23 ENCOUNTER — Ambulatory Visit (HOSPITAL_BASED_OUTPATIENT_CLINIC_OR_DEPARTMENT_OTHER): Payer: Commercial Managed Care - HMO | Admitting: Oncology

## 2016-02-23 VITALS — BP 126/63 | HR 52 | Temp 97.6°F | Resp 17 | Ht 71.0 in | Wt 158.2 lb

## 2016-02-23 DIAGNOSIS — C187 Malignant neoplasm of sigmoid colon: Secondary | ICD-10-CM

## 2016-02-23 LAB — CBC WITH DIFFERENTIAL/PLATELET
BASO%: 0.4 % (ref 0.0–2.0)
Basophils Absolute: 0 10*3/uL (ref 0.0–0.1)
EOS ABS: 0.2 10*3/uL (ref 0.0–0.5)
EOS%: 3.8 % (ref 0.0–7.0)
HEMATOCRIT: 42.9 % (ref 38.4–49.9)
HGB: 14.2 g/dL (ref 13.0–17.1)
LYMPH#: 1.6 10*3/uL (ref 0.9–3.3)
LYMPH%: 30.9 % (ref 14.0–49.0)
MCH: 33.2 pg (ref 27.2–33.4)
MCHC: 33 g/dL (ref 32.0–36.0)
MCV: 100.4 fL — ABNORMAL HIGH (ref 79.3–98.0)
MONO#: 0.5 10*3/uL (ref 0.1–0.9)
MONO%: 10.3 % (ref 0.0–14.0)
NEUT%: 54.6 % (ref 39.0–75.0)
NEUTROS ABS: 2.8 10*3/uL (ref 1.5–6.5)
PLATELETS: 126 10*3/uL — AB (ref 140–400)
RBC: 4.27 10*6/uL (ref 4.20–5.82)
RDW: 16.7 % — ABNORMAL HIGH (ref 11.0–14.6)
WBC: 5.1 10*3/uL (ref 4.0–10.3)

## 2016-02-23 LAB — COMPREHENSIVE METABOLIC PANEL
ALT: 15 U/L (ref 0–55)
ANION GAP: 5 meq/L (ref 3–11)
AST: 22 U/L (ref 5–34)
Albumin: 3.5 g/dL (ref 3.5–5.0)
Alkaline Phosphatase: 62 U/L (ref 40–150)
BILIRUBIN TOTAL: 1.72 mg/dL — AB (ref 0.20–1.20)
BUN: 17.6 mg/dL (ref 7.0–26.0)
CALCIUM: 9.1 mg/dL (ref 8.4–10.4)
CO2: 29 mEq/L (ref 22–29)
CREATININE: 0.8 mg/dL (ref 0.7–1.3)
Chloride: 107 mEq/L (ref 98–109)
EGFR: 82 mL/min/{1.73_m2} — ABNORMAL LOW (ref >90)
Glucose: 86 mg/dl (ref 70–140)
Potassium: 4.8 mEq/L (ref 3.5–5.1)
Sodium: 140 mEq/L (ref 136–145)
TOTAL PROTEIN: 6.3 g/dL — AB (ref 6.4–8.3)

## 2016-02-23 NOTE — Telephone Encounter (Signed)
Pt changed home number. New number as above.

## 2016-02-23 NOTE — Progress Notes (Signed)
  [place] OFFICE PROGRESS NOTE   Diagnosis: Colon cancer  INTERVAL HISTORY:   Keith [name] returns as scheduled. He completed the final cycle of Xeloda beginning 01/27/2016. No mouth sores or diarrhea. Mild discomfort at the soles. He reports discomfort in his eyes when reading. No excessive tearing.  Objective:  Vital signs in last 24 hours:  Blood pressure 126/63, pulse 52, temperature 97.6 F (36.4 C), temperature source Oral, resp. rate 17, height 5' 11" (1.803 m), weight 158 lb 3.2 oz (71.759 kg), SpO2 99 %.    HEENT: No thrush or ulcers Lymphatics: No cervical, supraclavicular, axillary, or inguinal nodes Resp: Lungs clear bilaterally Cardio: Regular rate and rhythm GI: No hepatosplenomegaly, no mass Vascular: No leg edema  Skin: Mild callus formation at the soles     Lab Results:  Lab Results  Component Value Date   WBC 5.1 02/23/2016   HGB 14.2 02/23/2016   HCT 42.9 02/23/2016   MCV 100.4* 02/23/2016   PLT 126* 02/23/2016   NEUTROABS 2.8 02/23/2016     Lab Results  Component Value Date   CEA1 2.1 01/24/2016    Medications: I have reviewed the patient's current medications.  Assessment/Plan: 1. Adenocarcinoma at 20 cm from the anal verge, most consistent with sigmoid colon cancer  Staging PET scan 07/05/2015 with no evidence of metastatic disease  Endoscopic ultrasound 06/29/2015 consistent with a T2N0 tumor  Status post a robotic low anterior resection 08/02/2015 with tumor confirmed above the peritoneal reflection, pT2 vs pT3,N1b, focally positive circumferential margin, no loss of mismatch repair protein expression  Cycle 1 adjuvant Xeloda 09/02/2015  Cycle 2 adjuvant Xeloda 09/23/2015  Cycle 3 adjuvant Xeloda 10/14/2015; dose reduced to 1000 mg in the morning and 500 mg in the evening for 14 days followed by 7 days off (skin rash, hand-foot syndrome)  Cycle 4 adjuvant Xeloda 11/04/2015  Cycle 5 adjuvant Xeloda  11/25/2015  Cycle 6 adjuvant Xeloda 12/16/2015  Cycle 7 adjuvant Xeloda 01/06/2016  Cycle 8 adjuvant Xeloda 01/27/2016  2. History of coronary artery disease  3. Mild hyperbilirubinemia -chronic, Gilberts?     Disposition:  Keith [name] is in clinical remission from colon cancer. He has completed adjuvant therapy. He will return for an office visit and CEA in 6 months. We will refer him to Dr. Amedeo [name] for a surveillance colonoscopy. He will follow-up with his ophthalmologist if the ice symptoms do not improve over the next month.  Keith Coder, MD  02/23/2016  10:02 AM

## 2016-02-23 NOTE — Telephone Encounter (Signed)
per pof to sch pt appt-sch pt referral-cld Dr Amedeo Plenty office and talked w/Melissa and was told Altha Harm will call and make appt w/patient

## 2016-03-02 ENCOUNTER — Ambulatory Visit: Payer: Self-pay | Admitting: Physician Assistant

## 2016-03-12 ENCOUNTER — Encounter: Payer: Self-pay | Admitting: Physician Assistant

## 2016-03-12 ENCOUNTER — Ambulatory Visit (INDEPENDENT_AMBULATORY_CARE_PROVIDER_SITE_OTHER): Payer: Commercial Managed Care - HMO | Admitting: Physician Assistant

## 2016-03-12 VITALS — BP 110/60 | HR 56 | Temp 97.5°F | Resp 16 | Ht 71.0 in | Wt 157.0 lb

## 2016-03-12 DIAGNOSIS — Z0001 Encounter for general adult medical examination with abnormal findings: Secondary | ICD-10-CM | POA: Diagnosis not present

## 2016-03-12 DIAGNOSIS — R972 Elevated prostate specific antigen [PSA]: Secondary | ICD-10-CM

## 2016-03-12 DIAGNOSIS — Z23 Encounter for immunization: Secondary | ICD-10-CM

## 2016-03-12 DIAGNOSIS — I10 Essential (primary) hypertension: Secondary | ICD-10-CM | POA: Diagnosis not present

## 2016-03-12 DIAGNOSIS — E559 Vitamin D deficiency, unspecified: Secondary | ICD-10-CM

## 2016-03-12 DIAGNOSIS — C187 Malignant neoplasm of sigmoid colon: Secondary | ICD-10-CM

## 2016-03-12 DIAGNOSIS — N182 Chronic kidney disease, stage 2 (mild): Secondary | ICD-10-CM

## 2016-03-12 DIAGNOSIS — R7303 Prediabetes: Secondary | ICD-10-CM | POA: Diagnosis not present

## 2016-03-12 DIAGNOSIS — Z0000 Encounter for general adult medical examination without abnormal findings: Secondary | ICD-10-CM

## 2016-03-12 DIAGNOSIS — E785 Hyperlipidemia, unspecified: Secondary | ICD-10-CM | POA: Diagnosis not present

## 2016-03-12 DIAGNOSIS — Z79899 Other long term (current) drug therapy: Secondary | ICD-10-CM | POA: Diagnosis not present

## 2016-03-12 DIAGNOSIS — R6889 Other general symptoms and signs: Secondary | ICD-10-CM

## 2016-03-12 DIAGNOSIS — K219 Gastro-esophageal reflux disease without esophagitis: Secondary | ICD-10-CM

## 2016-03-12 LAB — LIPID PANEL
CHOL/HDL RATIO: 2.9 ratio (ref <=5.0)
CHOLESTEROL: 141 mg/dL (ref 125–200)
HDL: 48 mg/dL (ref >=40)
LDL Cholesterol: 64 mg/dL (ref <130)
Triglycerides: 143 mg/dL (ref <150)
VLDL: 29 mg/dL (ref <30)

## 2016-03-12 LAB — BASIC METABOLIC PANEL WITH GFR
BUN: 14 mg/dL (ref 7–25)
CO2: 27 mmol/L (ref 20–31)
Calcium: 8.9 mg/dL (ref 8.6–10.3)
Chloride: 105 mmol/L (ref 98–110)
Creat: 0.64 mg/dL — ABNORMAL LOW (ref 0.70–1.11)
GFR, Est African American: >89 mL/min (ref >=60)
GFR, Est Non African American: >89 mL/min (ref >=60)
Glucose, Bld: 75 mg/dL (ref 65–99)
Potassium: 4.5 mmol/L (ref 3.5–5.3)
Sodium: 140 mmol/L (ref 135–146)

## 2016-03-12 LAB — CBC WITH DIFFERENTIAL/PLATELET
Basophils Absolute: 0 cells/uL (ref 0–200)
Basophils Relative: 0 %
Eosinophils Absolute: 260 cells/uL (ref 15–500)
Eosinophils Relative: 5 %
HCT: 45.5 % (ref 38.5–50.0)
Hemoglobin: 15 g/dL (ref 13.2–17.1)
Lymphocytes Relative: 33 %
Lymphs Abs: 1716 cells/uL (ref 850–3900)
MCH: 32.3 pg (ref 27.0–33.0)
MCHC: 33 g/dL (ref 32.0–36.0)
MCV: 98.1 fL (ref 80.0–100.0)
MPV: 11.1 fL (ref 7.5–12.5)
Monocytes Absolute: 468 cells/uL (ref 200–950)
Monocytes Relative: 9 %
Neutro Abs: 2756 cells/uL (ref 1500–7800)
Neutrophils Relative %: 53 %
Platelets: 180 10*3/uL (ref 140–400)
RBC: 4.64 MIL/uL (ref 4.20–5.80)
RDW: 14.4 % (ref 11.0–15.0)
WBC: 5.2 10*3/uL (ref 3.8–10.8)

## 2016-03-12 LAB — HEPATIC FUNCTION PANEL
ALT: 14 U/L (ref 9–46)
AST: 22 U/L (ref 10–35)
Albumin: 3.9 g/dL (ref 3.6–5.1)
Alkaline Phosphatase: 56 U/L (ref 40–115)
BILIRUBIN DIRECT: 0.3 mg/dL — AB (ref <=0.2)
Indirect Bilirubin: 1.1 mg/dL (ref 0.2–1.2)
TOTAL PROTEIN: 6.3 g/dL (ref 6.1–8.1)
Total Bilirubin: 1.4 mg/dL — ABNORMAL HIGH (ref 0.2–1.2)

## 2016-03-12 LAB — MAGNESIUM: Magnesium: 2 mg/dL (ref 1.5–2.5)

## 2016-03-12 LAB — HEMOGLOBIN A1C
Hgb A1c MFr Bld: 5.5 % (ref <5.7)
MEAN PLASMA GLUCOSE: 111 mg/dL

## 2016-03-12 LAB — TSH: TSH: 1.79 mIU/L (ref 0.40–4.50)

## 2016-03-12 MED ORDER — ATORVASTATIN CALCIUM 40 MG PO TABS: 40.0000 mg | ORAL_TABLET | Freq: Every day | ORAL | Status: DC

## 2016-03-12 NOTE — Progress Notes (Signed)
MEDICARE ANNUAL WELLNESS VISIT AND FOLLOW UP Assessment:   1. Essential hypertension - CBC with Differential - BASIC METABOLIC PANEL WITH GFR - Hepatic function panel - TSH  2. Encounter for long-term (current) use of other medications - Magnesium  3. Hyperlipidemia -continue medications, check lipids, decrease fatty foods, increase activity. - Lipid panel  4. PreDiabetes Discussed general issues about diabetes pathophysiology and management., Educational material distributed., Suggested low cholesterol diet., Encouraged aerobic exercise., Discussed foot care., Reminded to get yearly retinal exam. - Hemoglobin A1c - HM DIABETES FOOT EXAM  5. Vitamin D deficiency  6. Chronic kidney disease, stage 2 Check BMP, control HTN, Chol, Sugars  7.  Gastroesophageal reflux disease without esophagitis Diet discussed  8. Malignant neoplasm of sigmoid colon ([place]) Continue follow up Dr. Amedeo Plenty and Dr. Ammie Dalton  9. Cancer of sigmoid colon ([place]) Continue follow up Dr. Amedeo Plenty and Dr. Ammie Dalton  10. Elevated PSA Won't check anymore  11. Need for prophylactic vaccination against Streptococcus pneumoniae (pneumococcus) - Pneumococcal polysaccharide vaccine 23-valent greater than or equal to 2yo subcutaneous/IM  12. Encounter for Medicare annual wellness exam    Plan:   During the course of the visit the patient was educated and counseled about appropriate screening and preventive services including:    Pneumococcal vaccine   Influenza vaccine  Td vaccine  Screening electrocardiogram  Colorectal cancer screening  Diabetes screening  Glaucoma screening  Nutrition counseling   Conditions/risks identified: BMI: Discussed weight loss, diet, and increase physical activity.  Increase physical activity: AHA recommends 150 minutes of physical activity a week.  Medications reviewed Diabetes is at goal, ACE/ARB therapy: Yes. Urinary Incontinence is not an issue: discussed non  pharmacology and pharmacology options.  Fall risk: low- discussed PT, home fall assessment, medications.    Subjective:  Keith [name] is a 82 y.o. male who presents for Medicare Annual Wellness Visit and 3 month follow up for HTN, hyperlipidemia, prediabetes, and vitamin D Def.  Date of last medicare wellness visit was 06/2014  82 y.o. male  presents for 3 month follow up with hypertension, hyperlipidemia, prediabetes and vitamin D. Patient was diagnosed with colon cancer in Oct 2016, a/p resection 08/02/2015, completed final cycle of chemotherapy with Dr. Benay Spice on 02/09/2016, will follow CEAs and follow with Dr. Amedeo Plenty. He states he is feeling back to normal since chemo except his eyes are still feeling scratchy, watery, and can not read well due to blurring, has followed with Dr. Thom Chimes, was given drops x 4 weeks but has not helped.   His blood pressure has been controlled at home, today their BP: 110/60 mmHg  He does workout, walks and goes to the [facility] . He had a normal stress test 2008, EF 86%, had PTCA in 1991.  He denies chest pain, shortness of breath, dizziness.  He is on cholesterol medication and denies myalgias. His cholesterol is at goal. The cholesterol last visit was:   Lab Results  Component Value Date   CHOL 128 12/02/2015   HDL 47 12/02/2015   LDLCALC 58 12/02/2015   TRIG 114 12/02/2015   CHOLHDL 2.7 12/02/2015   He has been working on diet and exercise for prediabetes, and denies paresthesia of the feet, polydipsia and polyuria. Last A1C in the office was:  Lab Results  Component Value Date   HGBA1C 5.7* 12/02/2015   Patient is on Vitamin D supplement.   Lab Results  Component Value Date   VD25OH 36 12/02/2015       Names  of Other Physician/Practitioners you currently use: 1. [facility] here for primary care 1) Dr. Thom Chimes, eye doctor, April 2017 2) Dental works, last visit April 2017 Patient Care Team: Unk Pinto, MD as PCP - General (Internal Medicine) Dyke Maes, [place] (Optometry) Franchot Gallo, MD as Consulting Physician (Urology) Arsenio Loader, MD as Referring Physician (Orthopedic Surgery) Teena Irani, MD as Consulting Physician (Gastroenterology) Tania Ade, RN as Registered Nurse (Medical Oncology) Ladell Pier, MD as Consulting Physician (Oncology)  Medication Review: Current Outpatient Prescriptions on File Prior to Visit  Medication Sig Dispense Refill  . aspirin 81 MG tablet Take 81 mg by mouth every morning.     Marland Kitchen atorvastatin (LIPITOR) 40 MG tablet TAKE 1 TABLET EVERY DAY (Patient taking differently: TAKE 1 TABLET BY MOUTH NIGHTLY.) 90 tablet 3  . Cholecalciferol (VITAMIN D PO) Take 8,000 Int'l Units by mouth every morning.     . loperamide (IMODIUM) 2 MG capsule Take 2 mg by mouth as needed for diarrhea or loose stools.    . Magnesium 250 MG TABS Take 1 tablet by mouth 2 (two) times daily.     . Omega-3 Fatty Acids (FISH OIL PO) Take 1 tablet by mouth every morning.     . ranitidine (ZANTAC) 300 MG tablet Take 1 tablet (300 mg total) by mouth 2 (two) times daily. For indigestion and acid reflux (Patient taking differently: Take 150 mg by mouth daily. For indigestion and acid reflux) 180 tablet 1   No current facility-administered medications on file prior to visit.    Current Problems (verified) Patient Active Problem List   Diagnosis Date Noted  . Encounter for chemotherapy management 08/30/2015  . Cancer of sigmoid colon ([place]) 08/02/2015  . Colon cancer ([place]) 07/15/2015  . Medication management 03/02/2014  . Prediabetes 09/14/2013  . Hypertension   . Chronic kidney disease, Stage 2 (GFR 82 ml/min)   . Hyperlipidemia   . GERD (gastroesophageal reflux disease)   . Vitamin D deficiency   . Elevated PSA     Screening Tests Immunization History  Administered Date(s) Administered  . Influenza Split 07/29/2013, 06/22/2015  . Influenza, High Dose Seasonal  PF 06/15/2014  . Pneumococcal Conjugate-13 06/15/2014  . Td 02/09/2010  . Zoster 10/08/2000   Preventative care: Last colonoscopy: 07/2015 PET image 06/2015  Prior vaccinations: TD or Tdap: 2011  Influenza: 2014 2016  Pneumococcal: 2004 DUE Prevnar 13: 2015 Shingles/Zostavax: 2002  History reviewed: allergies, current medications, past family history, past medical history, past social history, past surgical history and problem list  Family History  Problem Relation Age of Onset  . Heart disease Mother   . Heart disease Father    Past Surgical History  Procedure Laterality Date  . Eus N/A 06/29/2015    Procedure: LOWER ENDOSCOPIC ULTRASOUND (EUS);  Surgeon: Arta Silence, MD;  Location: [facility];  Service: Endoscopy;  Laterality: N/A;  . Hernia repair      left inguinal  . Xi robotic assisted lower anterior resection N/A 08/02/2015    Procedure: XI ROBOTIC ASSISTED LOWER ANTERIOR RESECTION;  Surgeon: Stark Klein, MD;  Location: [facility];  Service: General;  Laterality: N/A;   Risk Factors: Tobacco Social History  Substance Use Topics  . Smoking status: Former Smoker    Quit date: 10/04/1965  . Smokeless tobacco: None  . Alcohol Use: 0.0 oz/week    0 Standard drinks or equivalent per week     Comment: rarely   He does  not smoke.  Patient is not a former smoker. Are there smokers in your home (other than you)?  No  Alcohol Current alcohol use: social drinker  Caffeine Current caffeine use: coffee 1-2 /day  Exercise Current exercise: walking  Nutrition/Diet Current diet: in general, a "healthy" diet    Cardiac risk factors: advanced age (older than 63 for men, 34 for women), hypertension and male gender.  Depression Screen (Note: if answer to either of the following is "Yes", a more complete depression screening is indicated)   Q1: Over the past two weeks, have you felt down, depressed or hopeless? No  Q2: Over the past two weeks, have you felt little  interest or pleasure in doing things? No  Have you lost interest or pleasure in daily life? No  Do you often feel hopeless? No  Do you cry easily over simple problems? No  Activities of Daily Living In your present state of health, do you have any difficulty performing the following activities?:  Driving? No Managing money?  No Feeding yourself? No Getting from bed to chair? No Climbing a flight of stairs? No Preparing food and eating?: No Bathing or showering? No Getting dressed: No Getting to the toilet? No Using the toilet:No Moving around from place to place: No In the past year have you fallen or had a near fall?:No   Vision Difficulties: No  Hearing Difficulties: No Do you often ask people to speak up or repeat themselves? No Do you experience ringing or noises in your ears? No Do you have difficulty understanding soft or whispered voices? No  Cognition  Do you feel that you have a problem with memory?No  Do you often misplace items? No  Do you feel safe at home?  Yes  Advanced directives Does patient have a [place]? Yes Does patient have a Living Will? Yes   Objective:   Blood pressure 110/60, pulse 56, temperature 97.5 F (36.4 C), temperature source Temporal, resp. rate 16, height '5\' 11"'$  (1.803 m), weight 157 lb (71.215 kg), SpO2 97 %. Body mass index is 21.91 kg/(m^2).  General appearance: alert, no distress, WD/WN, male Cognitive Testing  Alert? Yes  Normal Appearance?Yes  Oriented to person? Yes  Place? Yes   Time? Yes  Recall of three objects?  Yes  Can perform simple calculations? Yes  Displays appropriate judgment?Yes  Can read the correct time from a watch face?Yes  HEENT: normocephalic, sclerae anicteric, TMs pearly, nares patent, no discharge or erythema, pharynx normal Oral cavity: MMM, no lesions Neck: supple, no lymphadenopathy, no thyromegaly, no masses Heart: RRR, normal S1, S2, no murmurs Lungs: CTA bilaterally,  no wheezes, rhonchi, or rales Abdomen: +bs, soft, non tender, non distended, no masses, no hepatomegaly, no splenomegaly Musculoskeletal: nontender, no swelling, no obvious deformity Extremities: no edema, no cyanosis, no clubbing Pulses: 2+ symmetric, upper and lower extremities, normal cap refill Neurological: alert, oriented x 3, CN2-12 intact, strength normal upper extremities and lower extremities, sensation normal throughout, DTRs 2+ throughout, no cerebellar signs, gait normal Psychiatric: normal affect, behavior normal, pleasant   Medicare Attestation I have personally reviewed: The patient's medical and social history Their use of alcohol, tobacco or illicit drugs Their current medications and supplements The patient's functional ability including ADLs,fall risks, home safety risks, cognitive, and hearing and visual impairment Diet and physical activities Evidence for depression or mood disorders  The patient's weight, height, BMI, and visual acuity have been recorded in the chart.  I have  made referrals, counseling, and provided education to the patient based on review of the above and I have provided the patient with a written personalized care plan for preventive services.     Vicie Mutters, PA-C   03/12/2016

## 2016-03-12 NOTE — Patient Instructions (Signed)
  Claritin or loratadine cheapest but likely the weakest  Cheapest at [employer], [employer], [employer]  If your eyes do not get better with allergy pill, call and I will send in allergy eye drop for you.   Allergic Conjunctivitis Allergic conjunctivitis is inflammation of the clear membrane that covers the white part of your eye and the inner surface of your eyelid (conjunctiva), and it is caused by allergies.  CAUSES This condition is caused by an allergic reaction. Common causes of an allergic reaction (allergens) include:  Dust.  Pollen.  Mold.  Animal dander or secretions. RISK FACTORS This condition is more likely to develop if you are exposed to high levels of allergens that cause the allergic reaction. This might include being outdoors when air pollen levels are high or being around animals that you are allergic to. SYMPTOMS Symptoms of this condition may include:  Eye redness.  Tearing of the eyes.  Watery eyes.  Itchy eyes.  Burning feeling in the eyes.  Clear drainage from the eyes.  Swollen eyelids. DIAGNOSIS This condition may be diagnosed by medical history and physical exam. If you have drainage from your eyes, it may be tested to rule out other causes of conjunctivitis. TREATMENT Treatment for this condition often includes medicines. These may be eye drops, ointments, or oral medicines. They may be prescription medicines or over-the-counter medicines. HOME CARE INSTRUCTIONS  Take or apply medicines only as directed by your health care provider.  Do not touch or rub your eyes.  Do not wear contact lenses until the inflammation is gone. Wear glasses instead.  Do not wear eye makeup until the inflammation is gone.  Apply a cool, clean washcloth to your eye for 10-20 minutes, 3-4 times a day.  Try to avoid whatever allergen is causing the allergic reaction. SEEK MEDICAL CARE IF:  Your symptoms get worse.  You have pus draining from your eye.  You have new  symptoms.  You have a fever.   This information is not intended to replace advice given to you by your health care provider. Make sure you discuss any questions you have with your health care provider.   Document Released: 12/15/2002 Document Revised: 10/15/2014 Document Reviewed: 07/06/2014 Elsevier Interactive Patient Education [employer].

## 2016-03-13 LAB — VITAMIN D 25 HYDROXY (VIT D DEFICIENCY, FRACTURES): VIT D 25 HYDROXY: 60 ng/mL (ref 30–100)

## 2016-05-28 ENCOUNTER — Encounter: Payer: Self-pay | Admitting: Internal Medicine

## 2016-06-12 ENCOUNTER — Encounter: Payer: Self-pay | Admitting: Internal Medicine

## 2016-06-12 ENCOUNTER — Ambulatory Visit (INDEPENDENT_AMBULATORY_CARE_PROVIDER_SITE_OTHER): Payer: Commercial Managed Care - HMO | Admitting: Internal Medicine

## 2016-06-12 VITALS — BP 138/74 | HR 56 | Temp 97.4°F | Resp 16 | Ht 71.0 in | Wt 159.6 lb

## 2016-06-12 DIAGNOSIS — Z23 Encounter for immunization: Secondary | ICD-10-CM | POA: Diagnosis not present

## 2016-06-12 DIAGNOSIS — E559 Vitamin D deficiency, unspecified: Secondary | ICD-10-CM

## 2016-06-12 DIAGNOSIS — R6889 Other general symptoms and signs: Secondary | ICD-10-CM | POA: Diagnosis not present

## 2016-06-12 DIAGNOSIS — Z79899 Other long term (current) drug therapy: Secondary | ICD-10-CM

## 2016-06-12 DIAGNOSIS — R7303 Prediabetes: Secondary | ICD-10-CM | POA: Diagnosis not present

## 2016-06-12 DIAGNOSIS — E785 Hyperlipidemia, unspecified: Secondary | ICD-10-CM | POA: Diagnosis not present

## 2016-06-12 DIAGNOSIS — Z0001 Encounter for general adult medical examination with abnormal findings: Secondary | ICD-10-CM | POA: Diagnosis not present

## 2016-06-12 DIAGNOSIS — I10 Essential (primary) hypertension: Secondary | ICD-10-CM | POA: Diagnosis not present

## 2016-06-12 DIAGNOSIS — Z125 Encounter for screening for malignant neoplasm of prostate: Secondary | ICD-10-CM

## 2016-06-12 DIAGNOSIS — K219 Gastro-esophageal reflux disease without esophagitis: Secondary | ICD-10-CM

## 2016-06-12 DIAGNOSIS — R972 Elevated prostate specific antigen [PSA]: Secondary | ICD-10-CM

## 2016-06-12 DIAGNOSIS — Z1211 Encounter for screening for malignant neoplasm of colon: Secondary | ICD-10-CM

## 2016-06-12 DIAGNOSIS — N182 Chronic kidney disease, stage 2 (mild): Secondary | ICD-10-CM

## 2016-06-12 DIAGNOSIS — Z136 Encounter for screening for cardiovascular disorders: Secondary | ICD-10-CM | POA: Diagnosis not present

## 2016-06-12 LAB — MAGNESIUM: MAGNESIUM: 2 mg/dL (ref 1.5–2.5)

## 2016-06-12 LAB — BASIC METABOLIC PANEL WITH GFR
BUN: 17 mg/dL (ref 7–25)
CALCIUM: 9.1 mg/dL (ref 8.6–10.3)
CO2: 25 mmol/L (ref 20–31)
CREATININE: 0.75 mg/dL (ref 0.70–1.11)
Chloride: 103 mmol/L (ref 98–110)
GFR, Est African American: >89 mL/min (ref >=60)
GFR, Est Non African American: 85 mL/min (ref >=60)
GLUCOSE: 98 mg/dL (ref 65–99)
Potassium: 4.3 mmol/L (ref 3.5–5.3)
SODIUM: 142 mmol/L (ref 135–146)

## 2016-06-12 LAB — CBC WITH DIFFERENTIAL/PLATELET
BASOS ABS: 0 {cells}/uL (ref 0–200)
Basophils Relative: 0 %
EOS PCT: 4 %
Eosinophils Absolute: 180 cells/uL (ref 15–500)
HEMATOCRIT: 43.5 % (ref 38.5–50.0)
HEMOGLOBIN: 14.6 g/dL (ref 13.2–17.1)
LYMPHS ABS: 1485 {cells}/uL (ref 850–3900)
Lymphocytes Relative: 33 %
MCH: 30 pg (ref 27.0–33.0)
MCHC: 33.6 g/dL (ref 32.0–36.0)
MCV: 89.3 fL (ref 80.0–100.0)
MPV: 11 fL (ref 7.5–12.5)
Monocytes Absolute: 450 cells/uL (ref 200–950)
Monocytes Relative: 10 %
NEUTROS PCT: 53 %
Neutro Abs: 2385 cells/uL (ref 1500–7800)
Platelets: 166 10*3/uL (ref 140–400)
RBC: 4.87 MIL/uL (ref 4.20–5.80)
RDW: 13.4 % (ref 11.0–15.0)
WBC: 4.5 10*3/uL (ref 3.8–10.8)

## 2016-06-12 LAB — LIPID PANEL
CHOLESTEROL: 135 mg/dL (ref 125–200)
HDL: 49 mg/dL (ref >=40)
LDL Cholesterol: 65 mg/dL (ref <130)
Total CHOL/HDL Ratio: 2.8 Ratio (ref <=5.0)
Triglycerides: 106 mg/dL (ref <150)
VLDL: 21 mg/dL (ref <30)

## 2016-06-12 LAB — HEPATIC FUNCTION PANEL
ALBUMIN: 3.8 g/dL (ref 3.6–5.1)
ALT: 18 U/L (ref 9–46)
AST: 23 U/L (ref 10–35)
Alkaline Phosphatase: 63 U/L (ref 40–115)
Bilirubin, Direct: 0.2 mg/dL (ref <=0.2)
Indirect Bilirubin: 1 mg/dL (ref 0.2–1.2)
TOTAL PROTEIN: 6.5 g/dL (ref 6.1–8.1)
Total Bilirubin: 1.2 mg/dL (ref 0.2–1.2)

## 2016-06-12 NOTE — Progress Notes (Signed)
[facility]   Unk Pinto, M.D.    [name]. [name], P.A.-C      Starlyn Skeans, P.A.-C  [place] [address] [place] [address] Telephone [phone] Telefax [phone]  Annual  Screening/Preventative Visit  & Comprehensive Evaluation & Examination     This very nice 82y.o.MWM presents for a Wellness/Preventative Visit & comprehensive evaluation and management of multiple medical co-morbidities.  Patient has been followed for HTN, Prediabetes, Hyperlipidemia and Vitamin D Deficiency. Patient is s/p Colon Ca Surg in Oct 2016 and s/p ChemoTx w/Xeloda by Dr Benay Spice. Patient does have hx/o GERD controlled with diet and low dose Ranitidine.      Patient has hx/o labile HTN and hx/o PTCA in 1981 and has done well since with a negative Cardiolite (EF 86%) in 2008. Patient's BP has been controlled at home.Today's BP: 138/74. Patient denies any cardiac symptoms as chest pain, palpitations, shortness of breath, dizziness or ankle swelling.     Patient's hyperlipidemia is controlled with diet and medications. Patient denies myalgias or other medication SE's. Last lipids were at goal: Lab Results  Component Value Date   CHOL 141 03/12/2016   HDL 48 03/12/2016   LDLCALC 64 03/12/2016   TRIG 143 03/12/2016   CHOLHDL 2.9 03/12/2016      Patient has prediabetes since 2014 with A1c 5.9% and patient denies reactive hypoglycemic symptoms, visual blurring, diabetic polys or paresthesias. Last A1c was at goal & normal:  Lab Results  Component Value Date   HGBA1C 5.5 03/12/2016       Finally, patient has history of Vitamin D Deficiency in 2009 of "27" and last vitamin D was at goal: Lab Results  Component Value Date   VD25OH 60 03/12/2016   Current Outpatient Prescriptions on File Prior to Visit  Medication Sig  . aspirin 81 MG tablet Take 81 mg by mouth every morning.   Marland Kitchen  atorvastatin (LIPITOR) 40 MG tablet Take 1 tablet (40 mg total) by mouth daily.  . Cholecalciferol (VITAMIN D PO) Take 8,000 Int'l Units by mouth every morning.   . loperamide (IMODIUM) 2 MG capsule Take 2 mg by mouth as needed for diarrhea or loose stools.  . Magnesium 250 MG TABS Take 1 tablet by mouth 2 (two) times daily.   . Omega-3 Fatty Acids (FISH OIL PO) Take 1 tablet by mouth every morning.   . ranitidine (ZANTAC) 300 MG tablet Takes 150 mg by mouth daily.    Allergies  Allergen Reactions  . Penicillins    Past Medical History:  Diagnosis Date  . Cancer ([place])    cancer of recto-sigmoid junction-dx 07/2015  . Elevated PSA   . GERD (gastroesophageal reflux disease)   . Hemorrhoids   . Hyperlipidemia   . Hypertension   . Pneumonia    age 30  . Vitamin D deficiency    Health Maintenance  Topic Date Due  . INFLUENZA VACCINE  05/08/2016  . TETANUS/TDAP  02/10/2020  . ZOSTAVAX  Completed  . PNA vac Low Risk Adult  Completed   Immunization History  Administered Date(s) Administered  . Influenza Split 07/29/2013, 06/22/2015  . Influenza, High Dose Seasonal PF 06/15/2014, 06/12/2016  . Pneumococcal Conjugate-13 06/15/2014  . Pneumococcal Polysaccharide-23 03/12/2016  . Td 02/09/2010  . Zoster  10/08/2000   Past Surgical History:  Procedure Laterality Date  . EUS N/A 06/29/2015   Procedure: LOWER ENDOSCOPIC ULTRASOUND (EUS);  Surgeon: Arta Silence, MD;  Location: [facility];  Service: Endoscopy;  Laterality: N/A;  . HERNIA REPAIR     left inguinal  . XI ROBOTIC ASSISTED LOWER ANTERIOR RESECTION N/A 08/02/2015   Procedure: XI ROBOTIC ASSISTED LOWER ANTERIOR RESECTION;  Surgeon: Stark Klein, MD;  Location: [facility];  Service: General;  Laterality: N/A;   Family History  Problem Relation Age of Onset  . Heart disease Mother   . Heart disease Father    Social History   Social History  . Marital status: Married   Occupational History  . Retired   Social History  Main Topics  . Smoking status: Former Smoker    Quit date: 10/04/1965  . Smokeless tobacco: Not on file  . Alcohol use 0.0 oz/week     Comment: rarely  . Drug use: No  . Sexual activity: No   Social History Narrative   Married, wife Pamala Hurry   Retired    ROS Constitutional: Denies fever, chills, weight loss/gain, headaches, insomnia,  night sweats or change in appetite. Does c/o fatigue. Eyes: Denies redness, blurred vision, diplopia, discharge, itchy or watery eyes.  ENT: Denies discharge, congestion, post nasal drip, epistaxis, sore throat, earache, hearing loss, dental pain, Tinnitus, Vertigo, Sinus pain or snoring.  Cardio: Denies chest pain, palpitations, irregular heartbeat, syncope, dyspnea, diaphoresis, orthopnea, PND, claudication or edema Respiratory: denies cough, dyspnea, DOE, pleurisy, hoarseness, laryngitis or wheezing.  Gastrointestinal: Denies dysphagia, heartburn, reflux, water brash, pain, cramps, nausea, vomiting, bloating, diarrhea, constipation, hematemesis, melena, hematochezia, jaundice or hemorrhoids Genitourinary: Denies dysuria, frequency, urgency, nocturia, hesitancy, discharge, hematuria or flank pain Musculoskeletal: Denies arthralgia, myalgia, stiffness, Jt. Swelling, pain, limp or strain/sprain. Denies Falls. Skin: Denies puritis, rash, hives, warts, acne, eczema or change in skin lesion Neuro: No weakness, tremor, incoordination, spasms, paresthesia or pain Psychiatric: Denies confusion, memory loss or sensory loss. Denies Depression. Endocrine: Denies change in weight, skin, hair change, nocturia, and paresthesia, diabetic polys, visual blurring or hyper / hypo glycemic episodes.  Heme/Lymph: No excessive bleeding, bruising or enlarged lymph nodes.  Physical Exam  BP 138/74   Pulse (!) 56   Temp 97.4 F (36.3 C)   Resp 16   Ht '5\' 11"'$  (1.803 m)   Wt 159 lb 9.6 oz (72.4 kg)   BMI 22.26 kg/m   General Appearance: Well nourished, in no apparent  distress.  Eyes: PERRLA, EOMs, conjunctiva no swelling or erythema, normal fundi and vessels. Sinuses: No frontal/maxillary tenderness ENT/Mouth: EACs patent / TMs  nl. Nares clear without erythema, swelling, mucoid exudates. Oral hygiene is good. No erythema, swelling, or exudate. Tongue normal, non-obstructing. Tonsils not swollen or erythematous. Hearing normal.  Neck: Supple, thyroid normal. No bruits, nodes or JVD. Respiratory: Respiratory effort normal.  BS equal and clear bilateral without rales, rhonci, wheezing or stridor. Cardio: Heart sounds are normal with regular rate and rhythm and no murmurs, rubs or gallops. Peripheral pulses are normal and equal bilaterally without edema. No aortic or femoral bruits. Chest: symmetric with normal excursions and percussion.  Abdomen: Soft, with Nl bowel sounds. Nontender, no guarding, rebound, hernias, masses, or organomegaly.  Lymphatics: Non tender without lymphadenopathy.  Genitourinary: DRE - deferred for age & has upcoming colonoscopy next month. Musculoskeletal: Full ROM all peripheral extremities, joint stability, 5/5 strength, and normal gait. Skin: Warm and dry without rashes, lesions, cyanosis, clubbing or  ecchymosis.  Neuro: Cranial nerves intact, reflexes equal bilaterally. Normal muscle tone, no cerebellar symptoms. Sensation intact.  Pysch: Alert and oriented X 3 with normal affect, insight and judgment appropriate.   Assessment and Plan  1. Annual Preventative/Screening Exam   - CBC with Differential/Platelet - BASIC METABOLIC PANEL WITH GFR - Hepatic function panel - Magnesium - Lipid panel - TSH - Hemoglobin A1c - Insulin, random - VITAMIN D 25 Hydroxy  - Microalbumin / creatinine urine ratio - EKG 12-Lead - [place], RETROPERITNL ABD,  LTD - POC Hemoccult Bld/Stl  - Urinalysis, Routine w reflex microscopic  - PSA  2. Essential hypertension  - TSH - Microalbumin / creatinine urine ratio - EKG 12-Lead - [place],  RETROPERITNL ABD,  LTD  3. Hyperlipidemia  - Lipid panel - TSH  4. Prediabetes  - Hemoglobin A1c - Insulin, random  5. Vitamin D deficiency  - VITAMIN D 25 Hydroxy   6. Gastroesophageal reflux disease   7. Chronic kidney disease, stage 2 (mild)   8. Elevated PSA   9. Colon cancer screening  - POC Hemoccult Bld/Stl   10. Prostate cancer screening  - PSA  11. Screening for ischemic heart disease   12. Screening for AAA (aortic abdominal aneurysm)   13. Medication management  - CBC with Differential/Platelet - BASIC METABOLIC PANEL WITH GFR - Hepatic function panel - Magnesium - Urinalysis, Routine w reflex microscopic   14. Need for prophylactic vaccination and inoculation against influenza  - Flu vaccine HIGH DOSE PF (Fluzone High dose)   Continue prudent diet as discussed, weight control, BP monitoring, regular exercise, and medications as discussed.  Discussed med effects and SE's. Routine screening labs and tests as requested with regular follow-up as recommended. Over 40 minutes of exam, counseling, chart review and high complex critical decision making was performed

## 2016-06-12 NOTE — Patient Instructions (Signed)

## 2016-06-13 LAB — TSH: TSH: 1.41 mIU/L (ref 0.40–4.50)

## 2016-06-13 LAB — MICROALBUMIN / CREATININE URINE RATIO
CREATININE, URINE: 55 mg/dL (ref 20–370)
MICROALB UR: 0.2 mg/dL
MICROALB/CREAT RATIO: 4 ug/mg{creat} (ref <30)

## 2016-06-13 LAB — URINALYSIS, ROUTINE W REFLEX MICROSCOPIC
Bilirubin Urine: NEGATIVE
Glucose, UA: NEGATIVE
Hgb urine dipstick: NEGATIVE
KETONES UR: NEGATIVE
LEUKOCYTES UA: NEGATIVE
NITRITE: NEGATIVE
PH: 6 (ref 5.0–8.0)
Protein, ur: NEGATIVE
Specific Gravity, Urine: 1.011 (ref 1.001–1.035)

## 2016-06-13 LAB — HEMOGLOBIN A1C
Hgb A1c MFr Bld: 5.4 % (ref <5.7)
Mean Plasma Glucose: 108 mg/dL

## 2016-06-13 LAB — INSULIN, RANDOM: Insulin: 35.7 u[IU]/mL — ABNORMAL HIGH (ref 2.0–19.6)

## 2016-06-13 LAB — VITAMIN D 25 HYDROXY (VIT D DEFICIENCY, FRACTURES): Vit D, 25-Hydroxy: 64 ng/mL (ref 30–100)

## 2016-06-13 LAB — PSA: PSA: 2.7 ng/mL (ref <=4.0)

## 2016-07-17 ENCOUNTER — Telehealth: Payer: Self-pay

## 2016-07-17 NOTE — Telephone Encounter (Signed)
Called Dr. Amedeo Plenty office to see if patients appt was made for colonoscopy- referral made in May of 2017 for colonoscopy in October of 2017- Spoke to scheduling dept, they stated pts phone number they have on file is not working. Appt was made for 07/30/2016 at 230pm. Called pt with number in chart, informed pt of appt date and time. Pt verbalized understanding.

## 2016-08-27 ENCOUNTER — Other Ambulatory Visit: Payer: Commercial Managed Care - HMO

## 2016-08-27 ENCOUNTER — Telehealth: Payer: Self-pay | Admitting: Oncology

## 2016-08-27 ENCOUNTER — Ambulatory Visit: Payer: Commercial Managed Care - HMO | Admitting: Nurse Practitioner

## 2016-08-27 ENCOUNTER — Telehealth: Payer: Self-pay

## 2016-08-27 VITALS — BP 123/58 | HR 55 | Temp 97.5°F | Resp 17 | Ht 71.0 in | Wt 159.6 lb

## 2016-08-27 DIAGNOSIS — C187 Malignant neoplasm of sigmoid colon: Secondary | ICD-10-CM

## 2016-08-27 LAB — CEA (IN HOUSE-CHCC): CEA (CHCC-IN HOUSE): 1.5 ng/mL (ref 0.00–5.00)

## 2016-08-27 NOTE — Progress Notes (Addendum)
  [place] OFFICE PROGRESS NOTE   Diagnosis:  Colon cancer  INTERVAL HISTORY:   Keith [name] returns as scheduled. He feels well. He continues to have frequent bowel movements. No bloody or black stools. No nausea or vomiting. No abdominal pain. He has a good appetite.  He reports having a colonoscopy last week.  Objective:  Vital signs in last 24 hours:  Blood pressure (!) 123/58, pulse (!) 55, temperature 97.5 F (36.4 C), temperature source Oral, resp. rate 17, height '5\' 11"'$  (1.803 m), weight 159 lb 9.6 oz (72.4 kg), SpO2 98 %.    HEENT: No thrush or ulcers. Lymphatics: No palpable cervical, supra clavicular, axillary or inguinal lymph nodes. Resp: Lungs clear bilaterally. Cardio: Regular rate and rhythm. GI: Abdomen soft and nontender. No hepatomegaly. Vascular: No leg edema.   Lab Results:  Lab Results  Component Value Date   WBC 4.5 06/12/2016   HGB 14.6 06/12/2016   HCT 43.5 06/12/2016   MCV 89.3 06/12/2016   PLT 166 06/12/2016   NEUTROABS 2,385 06/12/2016    Imaging:  No results found.  Medications: I have reviewed the patient's current medications.  Assessment/Plan: 1. Adenocarcinoma at 20 cm from the anal verge, most consistent with sigmoid colon cancer ? Staging PET scan 07/05/2015 with no evidence of metastatic disease ? Endoscopic ultrasound 06/29/2015 consistent with a T2N0 tumor ? Status post a robotic low anterior resection 08/02/2015 with tumor confirmed above the peritoneal reflection, pT2 vs pT3,N1b, focally positive circumferential margin, no loss of mismatch repair protein expression ? Cycle 1 adjuvant Xeloda 09/02/2015 ? Cycle 2 adjuvant Xeloda 09/23/2015 ? Cycle 3 adjuvant Xeloda 10/14/2015; dose reduced to 1000 mg in the morning and 500 mg in the evening for 14 days followed by 7 days off (skin rash, hand-foot syndrome) ? Cycle 4 adjuvant Xeloda 11/04/2015 ? Cycle 5 adjuvant Xeloda 11/25/2015 ? Cycle 6 adjuvant Xeloda  12/16/2015 ? Cycle 7 adjuvant Xeloda 01/06/2016 ? Cycle 8 adjuvant Xeloda 01/27/2016 ? Colonoscopy 08/20/2016-few diverticula in the rectosigmoid colon. Repeat colonoscopy in 3 years for surveillance.  2. History of coronary artery disease  3. Mild hyperbilirubinemia -chronic, Gilberts?    Disposition:Keith [name] remains in clinical remission from colon cancer. We will follow-up on the CEA from today. We are referring him for restaging CT scans in the next few weeks. He will return for a follow-up visit and CEA in 6 months. He will contact the office in the interim with any problems.  Patient seen with Dr. Benay Spice.  Ned Card ANP/GNP-BC   08/27/2016  10:54 AM This was a shared visit with Ned Card. Keith [name] is in remission from colon cancer. He will be referred for surveillance CT scans.  Julieanne Manson, M.D.

## 2016-08-27 NOTE — Telephone Encounter (Signed)
Appointments scheduled per 11/20 LOS. Patient given AVS report and calendars with future scheduled appointments. Patient aware of CT scan. Given two bottles of contrast and instructions.

## 2016-08-27 NOTE — Telephone Encounter (Signed)
Called [facility] for recent colonoscopy report on patient, done last week. Spoke with Glenard Haring who states she will fax it over.

## 2016-08-28 ENCOUNTER — Telehealth: Payer: Self-pay

## 2016-08-28 LAB — CEA: CEA1: 1.2 ng/mL (ref 0.0–4.7)

## 2016-08-28 NOTE — Telephone Encounter (Signed)
Called and informed patients wife of normal cea results, she denies any questions or concerns at this time.

## 2016-08-28 NOTE — Telephone Encounter (Signed)
-----   Message from Owens Shark, NP sent at 08/27/2016  4:39 PM EST ----- Please let him know CEA is normal.

## 2016-09-08 ENCOUNTER — Encounter: Payer: Self-pay | Admitting: *Deleted

## 2016-09-10 ENCOUNTER — Other Ambulatory Visit: Payer: Commercial Managed Care - HMO

## 2016-09-12 ENCOUNTER — Other Ambulatory Visit (HOSPITAL_BASED_OUTPATIENT_CLINIC_OR_DEPARTMENT_OTHER): Payer: Commercial Managed Care - HMO

## 2016-09-12 ENCOUNTER — Ambulatory Visit (HOSPITAL_COMMUNITY)
Admission: RE | Admit: 2016-09-12 | Discharge: 2016-09-12 | Disposition: A | Discharge status: Home / Self Care | Payer: Commercial Managed Care - HMO | Source: Ambulatory Visit | Attending: Nurse Practitioner | Admitting: Nurse Practitioner

## 2016-09-12 DIAGNOSIS — I700 Atherosclerosis of aorta: Secondary | ICD-10-CM | POA: Diagnosis not present

## 2016-09-12 DIAGNOSIS — R911 Solitary pulmonary nodule: Secondary | ICD-10-CM | POA: Insufficient documentation

## 2016-09-12 DIAGNOSIS — I77811 Abdominal aortic ectasia: Secondary | ICD-10-CM | POA: Insufficient documentation

## 2016-09-12 DIAGNOSIS — I2510 Atherosclerotic heart disease of native coronary artery without angina pectoris: Secondary | ICD-10-CM | POA: Insufficient documentation

## 2016-09-12 DIAGNOSIS — C187 Malignant neoplasm of sigmoid colon: Secondary | ICD-10-CM

## 2016-09-12 LAB — BASIC METABOLIC PANEL
ANION GAP: 7 meq/L (ref 3–11)
BUN: 13.8 mg/dL (ref 7.0–26.0)
CO2: 28 mEq/L (ref 22–29)
CREATININE: 0.8 mg/dL (ref 0.7–1.3)
Calcium: 9.6 mg/dL (ref 8.4–10.4)
Chloride: 105 mEq/L (ref 98–109)
EGFR: 83 mL/min/{1.73_m2} — ABNORMAL LOW (ref >90)
GLUCOSE: 95 mg/dL (ref 70–140)
Potassium: 5 mEq/L (ref 3.5–5.1)
Sodium: 139 mEq/L (ref 136–145)

## 2016-09-12 MED ORDER — IOPAMIDOL (ISOVUE-300) INJECTION 61%
100.0000 mL | Freq: Once | INTRAVENOUS | Status: AC | PRN
  Administered 2016-09-12: 100 mL via INTRAVENOUS

## 2016-09-12 MED ORDER — SODIUM CHLORIDE 0.9 % IJ SOLN
INTRAMUSCULAR | Status: AC
  Filled 2016-09-12: qty 50

## 2016-09-12 MED ORDER — IOPAMIDOL (ISOVUE-300) INJECTION 61%
INTRAVENOUS | Status: AC
  Filled 2016-09-12: qty 100

## 2016-09-13 ENCOUNTER — Ambulatory Visit: Payer: Self-pay | Admitting: Internal Medicine

## 2016-09-17 ENCOUNTER — Telehealth: Payer: Self-pay

## 2016-09-17 NOTE — Telephone Encounter (Signed)
-----   Message from Ladell Pier, MD sent at 09/16/2016  8:14 PM EST ----- Please call patient, Ct negative for recurrent cancer, f/u as scheduled, copy to primary MD

## 2016-09-17 NOTE — Telephone Encounter (Signed)
Called and informed pt of CT results. Pt verbalized understanding and appreciative of call. Pt understands to follow up as scheduled.   Routed copy of CT to [facility]

## 2016-12-05 DIAGNOSIS — R69 Illness, unspecified: Secondary | ICD-10-CM | POA: Diagnosis not present

## 2016-12-13 ENCOUNTER — Ambulatory Visit (INDEPENDENT_AMBULATORY_CARE_PROVIDER_SITE_OTHER): Payer: Medicare HMO | Admitting: Internal Medicine

## 2016-12-13 ENCOUNTER — Encounter: Payer: Self-pay | Admitting: Internal Medicine

## 2016-12-13 VITALS — BP 112/64 | HR 64 | Temp 97.8°F | Resp 16 | Ht 71.0 in | Wt 161.6 lb

## 2016-12-13 DIAGNOSIS — K219 Gastro-esophageal reflux disease without esophagitis: Secondary | ICD-10-CM

## 2016-12-13 DIAGNOSIS — E559 Vitamin D deficiency, unspecified: Secondary | ICD-10-CM | POA: Diagnosis not present

## 2016-12-13 DIAGNOSIS — E782 Mixed hyperlipidemia: Secondary | ICD-10-CM | POA: Diagnosis not present

## 2016-12-13 DIAGNOSIS — Z79899 Other long term (current) drug therapy: Secondary | ICD-10-CM

## 2016-12-13 DIAGNOSIS — R7303 Prediabetes: Secondary | ICD-10-CM | POA: Diagnosis not present

## 2016-12-13 DIAGNOSIS — I10 Essential (primary) hypertension: Secondary | ICD-10-CM

## 2016-12-13 LAB — CBC WITH DIFFERENTIAL/PLATELET
BASOS PCT: 0 %
Basophils Absolute: 0 cells/uL (ref 0–200)
EOS PCT: 4 %
Eosinophils Absolute: 216 cells/uL (ref 15–500)
HCT: 45.3 % (ref 38.5–50.0)
HEMOGLOBIN: 14.9 g/dL (ref 13.2–17.1)
LYMPHS ABS: 1890 {cells}/uL (ref 850–3900)
LYMPHS PCT: 35 %
MCH: 29.3 pg (ref 27.0–33.0)
MCHC: 32.9 g/dL (ref 32.0–36.0)
MCV: 89 fL (ref 80.0–100.0)
MONO ABS: 432 {cells}/uL (ref 200–950)
MPV: 10.5 fL (ref 7.5–12.5)
Monocytes Relative: 8 %
NEUTROS PCT: 53 %
Neutro Abs: 2862 cells/uL (ref 1500–7800)
Platelets: 163 10*3/uL (ref 140–400)
RBC: 5.09 MIL/uL (ref 4.20–5.80)
RDW: 14 % (ref 11.0–15.0)
WBC: 5.4 10*3/uL (ref 3.8–10.8)

## 2016-12-13 LAB — HEPATIC FUNCTION PANEL
ALK PHOS: 57 U/L (ref 40–115)
ALT: 14 U/L (ref 9–46)
AST: 21 U/L (ref 10–35)
Albumin: 3.9 g/dL (ref 3.6–5.1)
BILIRUBIN DIRECT: 0.2 mg/dL (ref <=0.2)
BILIRUBIN INDIRECT: 1.1 mg/dL (ref 0.2–1.2)
Total Bilirubin: 1.3 mg/dL — ABNORMAL HIGH (ref 0.2–1.2)
Total Protein: 6.5 g/dL (ref 6.1–8.1)

## 2016-12-13 LAB — BASIC METABOLIC PANEL WITH GFR
BUN: 15 mg/dL (ref 7–25)
CALCIUM: 9.1 mg/dL (ref 8.6–10.3)
CO2: 28 mmol/L (ref 20–31)
Chloride: 104 mmol/L (ref 98–110)
Creat: 0.85 mg/dL (ref 0.70–1.11)
GFR, EST NON AFRICAN AMERICAN: 81 mL/min (ref >=60)
GFR, Est African American: >89 mL/min (ref >=60)
Glucose, Bld: 107 mg/dL — ABNORMAL HIGH (ref 65–99)
POTASSIUM: 4.3 mmol/L (ref 3.5–5.3)
SODIUM: 140 mmol/L (ref 135–146)

## 2016-12-13 LAB — LIPID PANEL
CHOL/HDL RATIO: 3.1 ratio (ref <5.0)
CHOLESTEROL: 148 mg/dL (ref <200)
HDL: 48 mg/dL (ref >40)
LDL Cholesterol: 74 mg/dL (ref <100)
TRIGLYCERIDES: 131 mg/dL (ref <150)
VLDL: 26 mg/dL (ref <30)

## 2016-12-13 LAB — HEMOGLOBIN A1C
Hgb A1c MFr Bld: 5.5 % (ref <5.7)
Mean Plasma Glucose: 111 mg/dL

## 2016-12-13 LAB — MAGNESIUM: Magnesium: 1.8 mg/dL (ref 1.5–2.5)

## 2016-12-13 LAB — TSH: TSH: 1.48 mIU/L (ref 0.40–4.50)

## 2016-12-13 MED ORDER — ATORVASTATIN CALCIUM 40 MG PO TABS: ORAL_TABLET | ORAL | 1 refills | Status: DC

## 2016-12-13 MED ORDER — RANITIDINE HCL 150 MG PO TABS: ORAL_TABLET | ORAL | 1 refills | Status: DC

## 2016-12-13 NOTE — Progress Notes (Signed)
[facility] Unk Pinto, M.D.        [name]. [name], P.A.-C       Starlyn Skeans, P.A.-C  [facility]                [address] [place], [place] [address] Telephone [phone] Telefax [phone] ______________________________________________________________________     This very nice 83 y.o. MWM presents for 3 month follow up with Hypertension, Hyperlipidemia, Pre-Diabetes and Vitamin D Deficiency.      In Oct 2016 , he underwent anterior  resection of a rectal cancer followed by Chemotx w/Xeloda  from  [id number] thru Apr 2017 and in Nov 2017 had a negative Colonoscopy recommending 3 yr f/u. Then in Dec 2017 he had Negative Chest & Abd/Pelvic CT scans.      Patient has hx/o labile HTN circa 1981 and is monitored expectantly.  Also in 1981 he presented with ACS and underwent PtCA and has done well since w/o any suspect CP or angina. BP has been controlled at home. And today's BP is at goal -  112/64. He had a negative Cardiolite (EF 86%)  in 2008. Patient has had no complaints of any cardiac type chest pain, palpitations, dyspnea/orthopnea/PND, dizziness, claudication, or dependent edema.     Hyperlipidemia is controlled with diet & meds. Patient denies myalgias or other med SE's. Last Lipids were  Lab Results  Component Value Date   CHOL 135 06/12/2016   HDL 49 06/12/2016   LDLCALC 65 06/12/2016   TRIG 106 06/12/2016   CHOLHDL 2.8 06/12/2016      Also, the patient has history of PreDiabetes (A1c 5.9% in 2014) and has had no symptoms of reactive hypoglycemia, diabetic polys, paresthesias or visual blurring.  Last A1c was  Lab Results  Component Value Date   HGBA1C 5.4 06/12/2016      Further, the patient also has history of Vitamin D Deficiency ("27" in 2009) and supplements vitamin D without any suspected side-effects. Last vitamin D was   Lab Results  Component Value Date   VD25OH 64  06/12/2016   Medication Sig  . aspirin 81 MG tablet Take 81 mg by mouth every morning.   Marland Kitchen atorvastatin  40 MG  Take 1 tablet (40 mg total) by mouth daily.  Marland Kitchen VITAMIN D Take 2,000 Int'l Units by mouth every morning.   . loperamide (IMODIUM) 2 MG Take 2 mg by mouth as needed for diarrhea or loose stools.  . Magnesium 250 MG  Take 1 tablet by mouth 2 (two) times daily.   . Omega-3 FISH OIL Take 1 tablet by mouth every morning.   . Ranitidine 150 MG tablet Take 150 mg by mouth daily. For indigestion and acid reflux)    Allergies  Allergen Reactions  . Penicillins         PMHx:   Past Medical History:  Diagnosis Date  . Cancer ([place])    cancer of recto-sigmoid junction-dx 07/2015  . Elevated PSA   . GERD (gastroesophageal reflux disease)   . Hemorrhoids   . Hyperlipidemia   . Hypertension   . Pneumonia    age 23  . Vitamin D deficiency    Immunization History  Administered Date(s) Administered  . Influenza Split 07/29/2013, 06/22/2015  . Influenza, High Dose Seasonal PF 06/15/2014, 06/12/2016  . Pneumococcal Conjugate-13 06/15/2014  . Pneumococcal Polysaccharide-23 03/12/2016  .  Td 02/09/2010  . Zoster 10/08/2000   Past Surgical History:  Procedure Laterality Date  . EUS N/A 06/29/2015   Procedure: LOWER ENDOSCOPIC ULTRASOUND (EUS);  Surgeon: Arta Silence, MD;  Location: [facility];  Service: Endoscopy;  Laterality: N/A;  . HERNIA REPAIR     left inguinal  . XI ROBOTIC ASSISTED LOWER ANTERIOR RESECTION N/A 08/02/2015   Procedure: XI ROBOTIC ASSISTED LOWER ANTERIOR RESECTION;  Surgeon: Stark Klein, MD;  Location: [facility];  Service: General;  Laterality: N/A;   FHx:    Reviewed / unchanged  SHx:    Reviewed / unchanged  Systems Review:  Constitutional: Denies fever, chills, wt changes, headaches, insomnia, fatigue, night sweats, change in appetite. Eyes: Denies redness, blurred vision, diplopia, discharge, itchy, watery eyes.  ENT: Denies discharge, congestion,  post nasal drip, epistaxis, sore throat, earache, hearing loss, dental pain, tinnitus, vertigo, sinus pain, snoring.  CV: Denies chest pain, palpitations, irregular heartbeat, syncope, dyspnea, diaphoresis, orthopnea, PND, claudication or edema. Respiratory: denies cough, dyspnea, DOE, pleurisy, hoarseness, laryngitis, wheezing.  Gastrointestinal: Denies dysphagia, odynophagia, heartburn, reflux, water brash, abdominal pain or cramps, nausea, vomiting, bloating, diarrhea, constipation, hematemesis, melena, hematochezia  or hemorrhoids. Genitourinary: Denies dysuria, frequency, urgency, nocturia, hesitancy, discharge, hematuria or flank pain. Musculoskeletal: Denies arthralgias, myalgias, stiffness, jt. swelling, pain, limping or strain/sprain.  Skin: Denies pruritus, rash, hives, warts, acne, eczema or change in skin lesion(s). Neuro: No weakness, tremor, incoordination, spasms, paresthesia or pain. Psychiatric: Denies confusion, memory loss or sensory loss. Endo: Denies change in weight, skin or hair change.  Heme/Lymph: No excessive bleeding, bruising or enlarged lymph nodes.  Physical Exam  BP 112/64   Pulse 64   Temp 97.8 F (36.6 C)   Resp 16   Ht '5\' 11"'$  (1.803 m)   Wt 161 lb 9.6 oz (73.3 kg)   BMI 22.54 kg/m   Appears well nourished and in no distress.  Eyes: PERRLA, EOMs, conjunctiva no swelling or erythema. Sinuses: No frontal/maxillary tenderness ENT/Mouth: EAC's clear, TM's nl w/o erythema, bulging. Nares clear w/o erythema, swelling, exudates. Oropharynx clear without erythema or exudates. Oral hygiene is good. Tongue normal, non obstructing. Hearing intact.  Neck: Supple. Thyroid nl. Car 2+/2+ without bruits, nodes or JVD. Chest: Respirations nl with BS clear & equal w/o rales, rhonchi, wheezing or stridor.  Cor: Heart sounds normal w/ regular rate and rhythm without sig. murmurs, gallops, clicks, or rubs. Peripheral pulses normal and equal  without edema.  Abdomen: Soft &  bowel sounds normal. Non-tender w/o guarding, rebound, hernias, masses, or organomegaly.  Lymphatics: Unremarkable.  Musculoskeletal: Full ROM all peripheral extremities, joint stability, 5/5 strength, and normal gait.  Skin: Warm, dry without exposed rashes, lesions or ecchymosis apparent.  Neuro: Cranial nerves intact, reflexes equal bilaterally. Sensory-motor testing grossly intact. Tendon reflexes grossly intact.  Pysch: Alert & oriented x 3.  Insight and judgement nl & appropriate. No ideations.  Assessment and Plan:  1. Essential hypertension  - Continue medication, monitor blood pressure at home.  - Continue DASH diet. Reminder to go to the ER if any CP,  SOB, nausea, dizziness, severe HA, changes vision/speech,  left arm numbness and tingling and jaw pain. - CBC with Differential/Platelet - BASIC METABOLIC PANEL WITH GFR - Magnesium - TSH  2. Mixed hyperlipidemia  - Continue diet/meds, exercise,& lifestyle modifications.  - Continue monitor periodic cholesterol/liver & renal functions  - Hepatic function panel - Lipid panel - TSH - atorvastatin (LIPITOR) 40 MG tablet; Take 1 tablet daily for Cholesterol  Dispense: 90 tablet; Refill: 1  3. Prediabetes  - Continue diet, exercise, lifestyle modifications.  - Monitor appropriate labs. - Hemoglobin A1c - Insulin, random  4. Vitamin D deficiency  - VITAMIN D 25 Hydroxy - Continue supplementation.  5. Gastroesophageal reflux disease  - ranitidine (ZANTAC) 150 MG tablet; Take 1 tablet daily for Indigestion and Acid Reflux  Dispense: 180 tablet; Refill: 1  6. Medication management  - CBC with Differential/Platelet - BASIC METABOLIC PANEL WITH GFR - Hepatic function panel - Magnesium - Lipid panel - TSH - Hemoglobin A1c - Insulin, random       Recommended regular exercise, BP monitoring, weight control, and discussed med and SE's. Recommended labs to assess and monitor clinical status. Further disposition  pending results of labs. Over 30 minutes of exam, counseling, chart review was performed

## 2016-12-13 NOTE — Patient Instructions (Signed)

## 2016-12-14 LAB — INSULIN, RANDOM: Insulin: 57.5 u[IU]/mL — ABNORMAL HIGH (ref 2.0–19.6)

## 2016-12-14 LAB — VITAMIN D 25 HYDROXY (VIT D DEFICIENCY, FRACTURES): Vit D, 25-Hydroxy: 65 ng/mL (ref 30–100)

## 2016-12-25 ENCOUNTER — Ambulatory Visit (INDEPENDENT_AMBULATORY_CARE_PROVIDER_SITE_OTHER): Payer: Medicare HMO | Admitting: Physician Assistant

## 2016-12-25 VITALS — BP 118/64 | HR 56 | Temp 97.5°F | Resp 16 | Ht 71.0 in | Wt 163.6 lb

## 2016-12-25 DIAGNOSIS — J0100 Acute maxillary sinusitis, unspecified: Secondary | ICD-10-CM

## 2016-12-25 MED ORDER — PREDNISONE 20 MG PO TABS: ORAL_TABLET | ORAL | 0 refills | Status: DC

## 2016-12-25 MED ORDER — AZITHROMYCIN 250 MG PO TABS: ORAL_TABLET | ORAL | 1 refills | Status: AC

## 2016-12-25 NOTE — Patient Instructions (Signed)
Make sure you are on an allergy pill, see below for more details. Please take the prednisone as directed below, this is NOT an antibiotic so you do NOT have to finish it. You can take it for a few days and stop it if you are doing better.   Please take the prednisone to help decrease inflammation and therefore decrease symptoms. Take it it with food to avoid GI upset. It can cause increased energy but on the other hand it can make it hard to sleep at night so please take it AT [place], it takes 8-12 hours to start working so it will NOT affect your sleeping if you take it at night with your food!!  If you are diabetic it will increase your sugars so decrease carbs and monitor your sugars closely.     - Try the Flonase or Nasonex. Remember to spray each nostril twice towards the outer part of your eye.  Do not sniff but instead pinch your nose and tilt your head back to help the medicine get into your sinuses.  The best time to do this is at bedtime.Stop if you get blurred vision or nose bleeds.   -Please pick one of the over the counter allergy medications below and take it once daily for allergies.  It will also help with fluid behind ear drums. Claritin or loratadine cheapest but likely the weakest  Zyrtec or certizine at night because it can make you sleepy The strongest is allegra or fexafinadine  Cheapest at [employer], [employer], [employer]  if worsening HA, changes vision/speech, imbalance, weakness go to the ER   HOW TO TREAT VIRAL COUGH AND COLD SYMPTOMS:  -Symptoms usually last at least 1 week with the worst symptoms being around day 4.  - colds usually start with a sore throat and end with a cough, and the cough can take 2 weeks to get better.  -No antibiotics are needed for colds, flu, sore throats, cough, bronchitis UNLESS symptoms are longer than 7 days OR if you are getting better then get drastically worse.  -There are a lot of combination medications (Dayquil, Nyquil, Vicks 44,  tyelnol cold and sinus, ETC). Please look at the ingredients on the back so that you are treating the correct symptoms and not doubling up on medications/ingredients.    Medicines you can use  Nasal congestion  - pseudoephedrine (Sudafed)- behind the counter, do not use if you have high blood pressure, medicine that have -D in them.  - phenylephrine (Sudafed PE) -Dextormethorphan + chlorpheniramine (Coridcidin HBP)- okay if you have high blood pressure -Oxymetazoline (Afrin) nasal spray- LIMIT to 3 days -Saline nasal spray -Neti pot (used distilled or bottled water)  Ear pain/congestion  -pseudoephedrine (sudafed) - Nasonex/flonase nasal spray  Fever  -Acetaminophen (Tyelnol) -Ibuprofen (Advil, motrin, aleve)  Sore Throat  -Acetaminophen (Tyelnol) -Ibuprofen (Advil, motrin, aleve) -Drink a lot of water -Gargle with salt water - Rest your voice (don't talk) -Throat sprays -Cough drops  Body Aches  -Acetaminophen (Tyelnol) -Ibuprofen (Advil, motrin, aleve)  Headache  -Acetaminophen (Tyelnol) -Ibuprofen (Advil, motrin, aleve) - Exedrin, Exedrin Migraine  Allergy symptoms (cough, sneeze, runny nose, itchy eyes) -Claritin or loratadine cheapest but likely the weakest  -Zyrtec or certizine at night because it can make you sleepy -The strongest is allegra or fexafinadine  Cheapest at [employer], [employer], [employer]  Cough  -Dextromethorphan (Delsym)- medicine that has DM in it -Guafenesin (Mucinex/Robitussin) - cough drops - drink lots of water  Chest Congestion  -  Guafenesin (Mucinex/Robitussin)  Red Itchy Eyes  - Naphcon-A  Upset Stomach  - Bland diet (nothing spicy, greasy, fried, and high acid foods like tomatoes, oranges, berries) -OKAY- cereal, bread, soup, crackers, rice -Eat smaller more frequent meals -reduce caffeine, no alcohol -Loperamide (Imodium-AD) if diarrhea -Prevacid for heart burn  General health when sick  -Hydration -wash your hands  frequently -keep surfaces clean -change pillow cases and sheets often -Get fresh air but do not exercise strenuously -Vitamin D, double up on it - Vitamin C -Zinc

## 2016-12-25 NOTE — Progress Notes (Signed)
Subjective:    Patient ID: Keith [name], male    DOB: 1931/12/17, 83 y.o.   MRN: [id number]  HPI 83 y.o. WM presents with sinus pain x 5 days.  Sinus pain, pressure, HA, sore throat.  No fever, chills, SOB, CP, as tried sudafed OTC and allergy pill and nasal spray with issues.   Blood pressure 118/64, pulse (!) 56, temperature 97.5 F (36.4 C), resp. rate 16, height '5\' 11"'$  (1.803 m), weight 163 lb 9.6 oz (74.2 kg).  Medications Current Outpatient Prescriptions on File Prior to Visit  Medication Sig  . aspirin 81 MG tablet Take 81 mg by mouth every morning.   Marland Kitchen atorvastatin (LIPITOR) 40 MG tablet Take 1 tablet daily for Cholesterol  . Cholecalciferol (VITAMIN D PO) Take 2,000 Int'l Units by mouth every morning.   . loperamide (IMODIUM) 2 MG capsule Take 2 mg by mouth as needed for diarrhea or loose stools.  . Magnesium 250 MG TABS Take 1 tablet by mouth 2 (two) times daily.   . Omega-3 Fatty Acids (FISH OIL PO) Take 1 tablet by mouth every morning.   . ranitidine (ZANTAC) 150 MG tablet Take 1 tablet daily for Indigestion and Acid Reflux   No current facility-administered medications on file prior to visit.     Problem list He has Hypertension; Chronic kidney disease, Stage 2 (GFR 82 ml/min); Hyperlipidemia; GERD (gastroesophageal reflux disease); Vitamin D deficiency; Elevated PSA; Prediabetes; Medication management; Colon cancer ([place]); Encounter for general adult medical examination with abnormal findings; and Cancer of sigmoid colon ([place]) on his problem list.   Review of Systems  Constitutional: Negative for chills, diaphoresis and fever.  HENT: Positive for congestion, sinus pressure, sneezing and sore throat. Negative for ear pain, trouble swallowing and voice change.   Eyes: Negative.   Respiratory: Negative for cough, chest tightness, shortness of breath and wheezing.   Cardiovascular: Negative.   Gastrointestinal: Negative.   Genitourinary: Negative.    Musculoskeletal: Negative for neck pain.  Neurological: Negative.  Negative for headaches.       Objective:   Physical Exam  Constitutional: He is oriented to person, place, and time. He appears well-developed and well-nourished.  HENT:  Head: Normocephalic and atraumatic.  Right Ear: Hearing and tympanic membrane normal.  Left Ear: Hearing and tympanic membrane normal.  Nose: Right sinus exhibits maxillary sinus tenderness. Left sinus exhibits maxillary sinus tenderness.  Mouth/Throat: Uvula is midline and mucous membranes are normal. Posterior oropharyngeal erythema present. No oropharyngeal exudate, posterior oropharyngeal edema or tonsillar abscesses.  Eyes: Conjunctivae are normal. Pupils are equal, round, and reactive to light.  Neck: Normal range of motion. Neck supple.  Cardiovascular: Normal rate and regular rhythm.   Pulmonary/Chest: Effort normal and breath sounds normal.  Abdominal: Soft. Bowel sounds are normal.  Musculoskeletal: Normal range of motion.  Lymphadenopathy:    He has no cervical adenopathy.  Neurological: He is alert and oriented to person, place, and time.  Skin: Skin is warm and dry. No rash noted.       Assessment & Plan:  1. Acute maxillary sinusitis, recurrence not specified - azithromycin (ZITHROMAX) 250 MG tablet; Take 2 tablets (500 mg) on  Day 1,  followed by 1 tablet (250 mg) once daily on Days 2 through 5.  Dispense: 6 each; Refill: 1 - predniSONE (DELTASONE) 20 MG tablet; 2 tablets daily for 3 days, 1 tablet daily for 4 days.  Dispense: 10 tablet; Refill: 0  Future Appointments Date Time Provider Department  Center  02/26/2017 11:00 AM [facility] ONLY [facility] None  02/26/2017 11:30 AM Ladell Pier, MD [facility] None  04/03/2017 9:30 AM Starlyn Skeans, PA-C [facility] None  07/12/2017 10:00 AM Unk Pinto, MD [facility] None

## 2017-01-17 DIAGNOSIS — R079 Chest pain, unspecified: Secondary | ICD-10-CM | POA: Diagnosis not present

## 2017-01-17 DIAGNOSIS — C189 Malignant neoplasm of colon, unspecified: Secondary | ICD-10-CM | POA: Diagnosis not present

## 2017-01-17 DIAGNOSIS — Z87891 Personal history of nicotine dependence: Secondary | ICD-10-CM | POA: Diagnosis not present

## 2017-01-17 DIAGNOSIS — R1013 Epigastric pain: Secondary | ICD-10-CM | POA: Diagnosis not present

## 2017-01-18 DIAGNOSIS — C189 Malignant neoplasm of colon, unspecified: Secondary | ICD-10-CM | POA: Diagnosis not present

## 2017-01-18 DIAGNOSIS — R079 Chest pain, unspecified: Secondary | ICD-10-CM | POA: Diagnosis not present

## 2017-01-18 DIAGNOSIS — R1013 Epigastric pain: Secondary | ICD-10-CM | POA: Diagnosis not present

## 2017-01-22 DIAGNOSIS — Z0000 Encounter for general adult medical examination without abnormal findings: Secondary | ICD-10-CM | POA: Diagnosis not present

## 2017-01-22 DIAGNOSIS — Z9861 Coronary angioplasty status: Secondary | ICD-10-CM | POA: Diagnosis not present

## 2017-01-22 DIAGNOSIS — E612 Magnesium deficiency: Secondary | ICD-10-CM | POA: Diagnosis not present

## 2017-01-22 DIAGNOSIS — R0602 Shortness of breath: Secondary | ICD-10-CM | POA: Diagnosis not present

## 2017-01-22 DIAGNOSIS — R197 Diarrhea, unspecified: Secondary | ICD-10-CM | POA: Diagnosis not present

## 2017-01-22 DIAGNOSIS — Z87891 Personal history of nicotine dependence: Secondary | ICD-10-CM | POA: Diagnosis not present

## 2017-01-22 DIAGNOSIS — K649 Unspecified hemorrhoids: Secondary | ICD-10-CM | POA: Diagnosis not present

## 2017-01-22 DIAGNOSIS — E785 Hyperlipidemia, unspecified: Secondary | ICD-10-CM | POA: Diagnosis not present

## 2017-01-22 DIAGNOSIS — R3915 Urgency of urination: Secondary | ICD-10-CM | POA: Diagnosis not present

## 2017-01-22 DIAGNOSIS — Z7982 Long term (current) use of aspirin: Secondary | ICD-10-CM | POA: Diagnosis not present

## 2017-01-22 DIAGNOSIS — G3184 Mild cognitive impairment, so stated: Secondary | ICD-10-CM | POA: Diagnosis not present

## 2017-01-22 DIAGNOSIS — M545 Low back pain: Secondary | ICD-10-CM | POA: Diagnosis not present

## 2017-02-01 DIAGNOSIS — H524 Presbyopia: Secondary | ICD-10-CM | POA: Diagnosis not present

## 2017-02-19 ENCOUNTER — Encounter: Payer: Self-pay | Admitting: *Deleted

## 2017-02-26 ENCOUNTER — Other Ambulatory Visit (HOSPITAL_BASED_OUTPATIENT_CLINIC_OR_DEPARTMENT_OTHER): Payer: Medicare HMO

## 2017-02-26 ENCOUNTER — Ambulatory Visit (HOSPITAL_BASED_OUTPATIENT_CLINIC_OR_DEPARTMENT_OTHER): Payer: Medicare HMO | Admitting: Oncology

## 2017-02-26 ENCOUNTER — Encounter: Payer: Self-pay | Admitting: Internal Medicine

## 2017-02-26 VITALS — BP 128/57 | HR 55 | Temp 97.8°F | Resp 17 | Ht 71.0 in | Wt 160.8 lb

## 2017-02-26 DIAGNOSIS — C187 Malignant neoplasm of sigmoid colon: Secondary | ICD-10-CM | POA: Diagnosis not present

## 2017-02-26 DIAGNOSIS — R911 Solitary pulmonary nodule: Secondary | ICD-10-CM

## 2017-02-26 LAB — CEA (IN HOUSE-CHCC): CEA (CHCC-IN HOUSE): 1.22 ng/mL (ref 0.00–5.00)

## 2017-02-26 NOTE — Progress Notes (Signed)
    [place] OFFICE PROGRESS NOTE   Diagnosis: Colon cancer  INTERVAL HISTORY:   Keith [name] returns as scheduled. He feels well. No difficulty with bowel function. He had an episode of chest pain while at the beach. He reports being seen in the emergency room and no etiology for the pain was found. No recurrent pain.  Objective:  Vital signs in last 24 hours:  Blood pressure (!) 128/57, pulse (!) 55, temperature 97.8 F (36.6 C), temperature source Oral, resp. rate 17, height '5\' 11"'$  (1.803 m), weight 160 lb 12.8 oz (72.9 kg), SpO2 100 %.    HEENT: Neck without mass Lymphatics: No cervical, supraclavicular, axillary, or inguinal nodes Resp: Bronchial sounds at the upper posterior chest bilaterally, no respiratory distress Cardio: Regular rate and rhythm GI: No hepatosplenomegaly, no mass, nontender Vascular: No leg edema   Lab Results:    Lab Results  Component Value Date   CEA1 1.50 08/27/2016   CEA1 1.2 08/27/2016    Medications: I have reviewed the patient's current medications.  Assessment/Plan: 1. Adenocarcinoma at 20 cm from the anal verge, most consistent with sigmoid colon cancer ? Staging PET scan 07/05/2015 with no evidence of metastatic disease ? Endoscopic ultrasound 06/29/2015 consistent with a T2N0 tumor ? Status post a robotic low anterior resection 08/02/2015 with tumor confirmed above the peritoneal reflection, pT2 vs pT3,N1b, focally positive circumferential margin, no loss of mismatch repair protein expression ? Cycle 1 adjuvant Xeloda 09/02/2015 ? Cycle 2 adjuvant Xeloda 09/23/2015 ? Cycle 3 adjuvant Xeloda 10/14/2015; dose reduced to 1000 mg in the morning and 500 mg in the evening for 14 days followed by 7 days off (skin rash, hand-foot syndrome) ? Cycle 4 adjuvant Xeloda 11/04/2015 ? Cycle 5 adjuvant Xeloda 11/25/2015 ? Cycle 6 adjuvant Xeloda 12/16/2015 ? Cycle 7 adjuvant Xeloda 01/06/2016 ? Cycle 8 adjuvant Xeloda  01/27/2016 ? Colonoscopy 08/20/2016-few diverticula in the rectosigmoid colon. Repeat colonoscopy in 3 years for surveillance. ? CTs chest, abdomen, and pelvis 09/12/2016-negative for metastatic disease, stable right upper lobe nodule  2. History of coronary artery disease  3. History of Mild hyperbilirubinemia -chronic, Gilberts?    Disposition:  Keith [name] remains in clinical remission from colon cancer. We will follow-up on the CEA from today. He will return for an office visit and restaging CTs in early December.  15 minutes were spent with the patient today. The majority of the time was used for counseling and coordination of care.  [name], MD  02/26/2017  11:39 AM

## 2017-02-27 LAB — CEA: CEA1: 1.4 ng/mL (ref 0.0–4.7)

## 2017-02-28 ENCOUNTER — Telehealth: Payer: Self-pay | Admitting: *Deleted

## 2017-02-28 ENCOUNTER — Encounter: Payer: Self-pay | Admitting: *Deleted

## 2017-02-28 NOTE — Telephone Encounter (Signed)
Call back from patient and patient notified per order of Dr. Benay Spice that cea is normal.  Patient has no questions at this time.

## 2017-02-28 NOTE — Telephone Encounter (Signed)
-----   Message from Ladell Pier, MD sent at 02/27/2017  8:45 PM EDT ----- Please call patient, cea is normal

## 2017-02-28 NOTE — Telephone Encounter (Signed)
-----   Message from Ladell Pier, MD sent at 02/27/2017  8:45 PM EDT ----- Please call patient, Keith [name] is normal

## 2017-02-28 NOTE — Telephone Encounter (Signed)
Left message for patient to call [facility] back for lab results. 

## 2017-03-01 ENCOUNTER — Telehealth: Payer: Self-pay | Admitting: Oncology

## 2017-03-01 NOTE — Telephone Encounter (Signed)
Patient bypassed scheduling on 02/26/17. Appointments scheduled for 09/09/17, per 02/26/17 los. Appointment letter and schedule mailed.

## 2017-04-03 ENCOUNTER — Ambulatory Visit: Payer: Self-pay | Admitting: Internal Medicine

## 2017-04-19 IMAGING — CT NM PET TUM IMG INITIAL (PI) SKULL BASE T - THIGH
8 series · 25 of 25 positions shown · non-contrast
Comparison: None.

CLINICAL DATA: Initial treatment strategy for rectal carcinoma..

EXAM:
NUCLEAR MEDICINE PET SKULL BASE TO THIGH
TECHNIQUE: 8.7 mCi F-18 FDG was injected intravenously. Full-ring PET imaging
was performed from the skull base to thigh after the radiotracer. CT
data was obtained and used for attenuation correction and anatomic
localization.
FASTING BLOOD GLUCOSE:  Value: 93 mg/dl

[Series 3: pet sk_thigh ac · axial · 5.0mm · 4.07mm/px · z∈[-884,+40]mm · 5 of 232 slices shown]
[im 1/232]
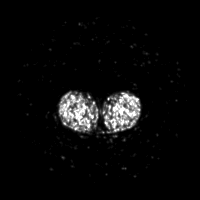
[im 58/232]
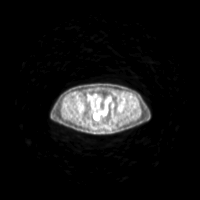
[im 116/232]
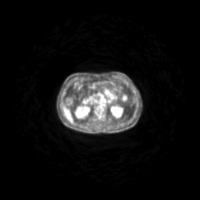
[im 174/232]
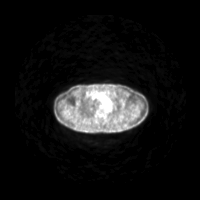
[im 232/232]
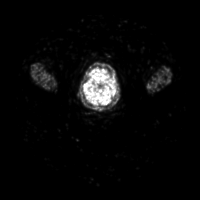

[Series 4: ct sk_thigh 5.0 b31f · axial · 5.0mm · 0.98mm/px · z∈[-884,+40]mm · 5 of 232 slices shown]
[im 1/232]
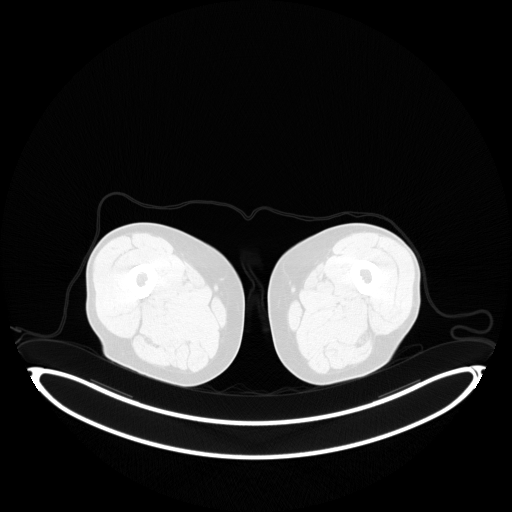
[im 58/232]
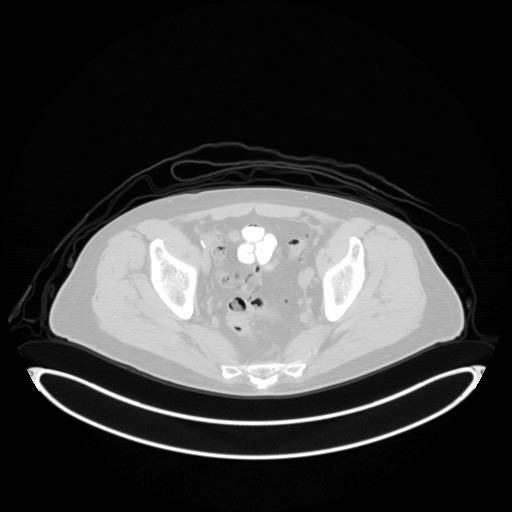
[im 116/232]
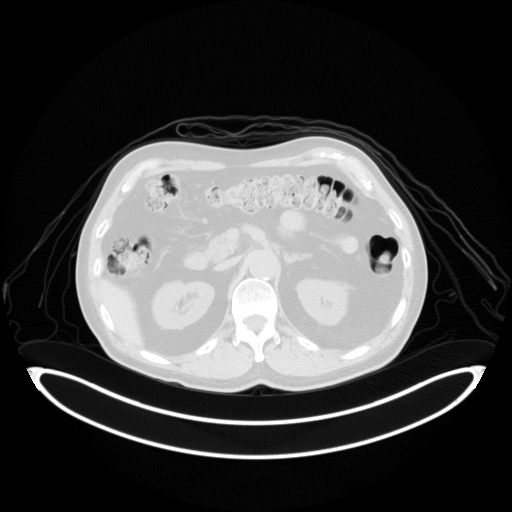
[im 174/232]
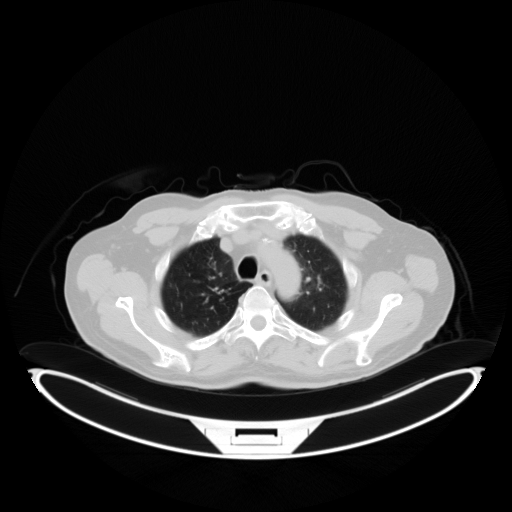
[im 232/232  brain]
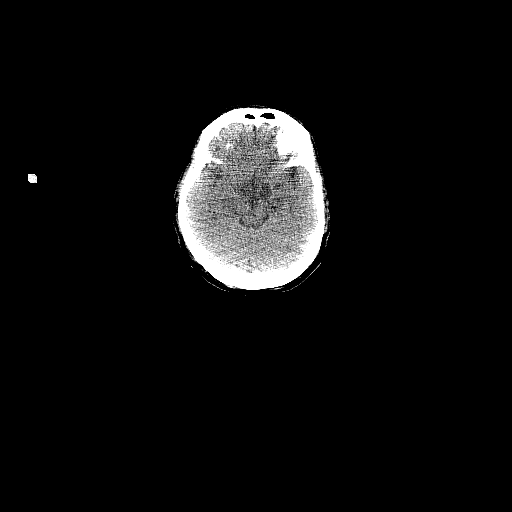

[Series 7: pet sk_thigh nac · axial · 5.0mm · 4.07mm/px · z∈[-884,+40]mm · 5 of 232 slices shown]
[im 1/232]
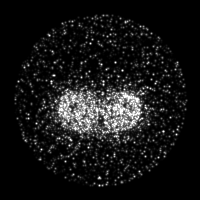
[im 58/232]
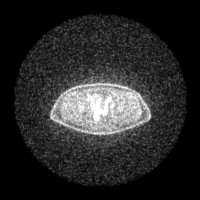
[im 116/232]
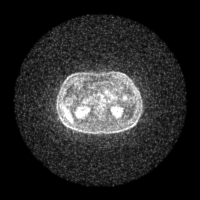
[im 174/232]
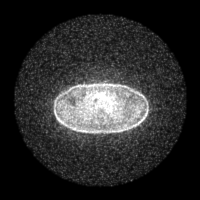
[im 232/232]
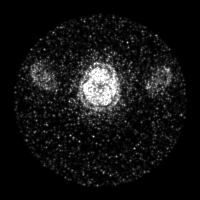

[Series 8: ct sk_thigh 5.0 b70f (id)_bone · axial · 5.0mm · 0.59mm/px · 1 of 57 slices shown]
[im 1/57  bone]
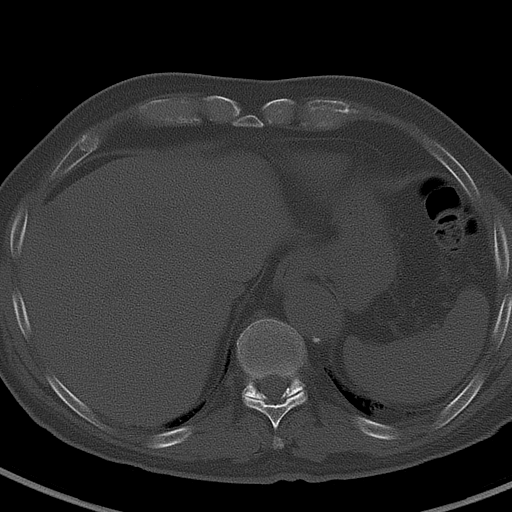

[Series 604: mip collection<mip range> · coronal · 1.92mm/px · 1 of 32 slices shown]
[im 1/32]
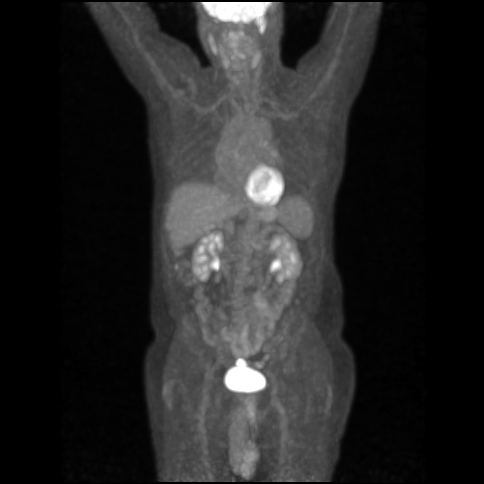

[Series 605: range-ac ct sk_thigh 5.0 hd_fov-cor-<alpha range> · 2 of 103 slices shown]
[im 1/103]
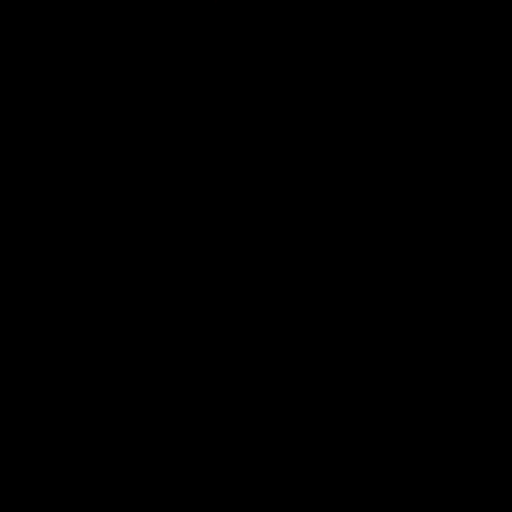
[im 103/103]
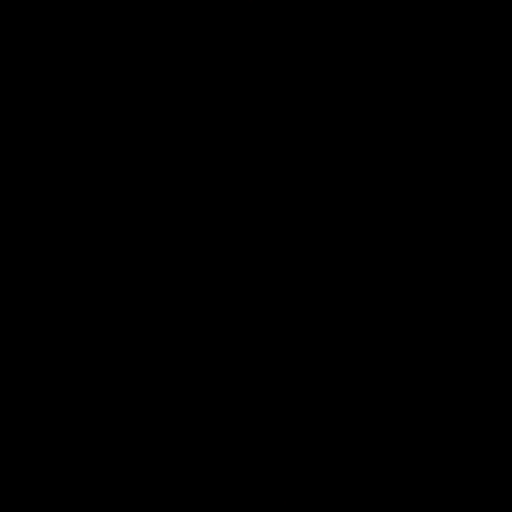

[Series 606: range-ac ct sk_thigh 5.0 hd_fov-tra-<alpha range> · 5 of 222 slices shown]
[im 1/222]
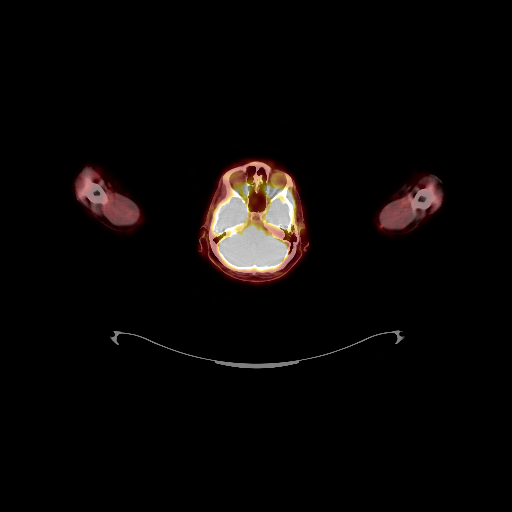
[im 56/222]
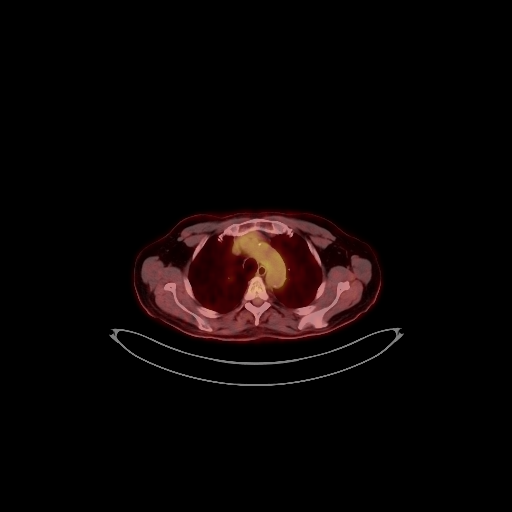
[im 111/222]
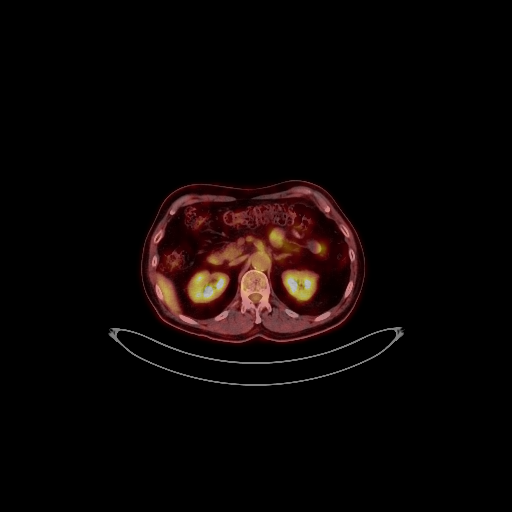
[im 166/222]
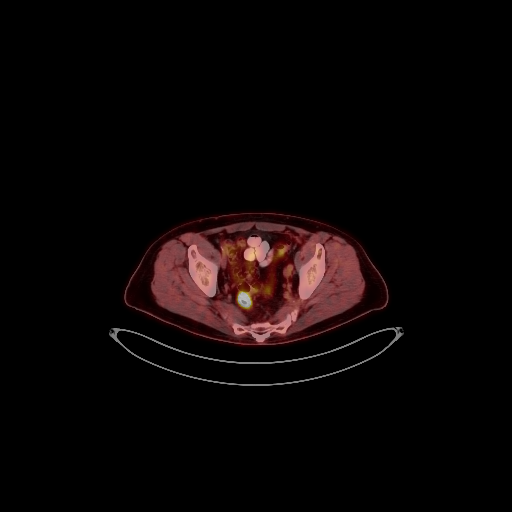
[im 222/222]
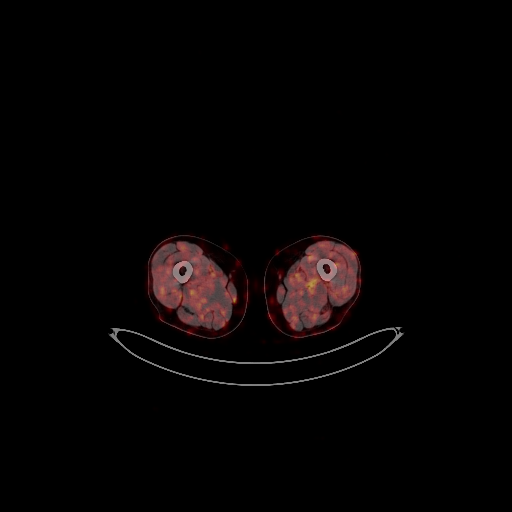

[Series 1032: results mm oncology reading · 0.89mm/px · 1 of 1 slices shown]
[im 1/1]
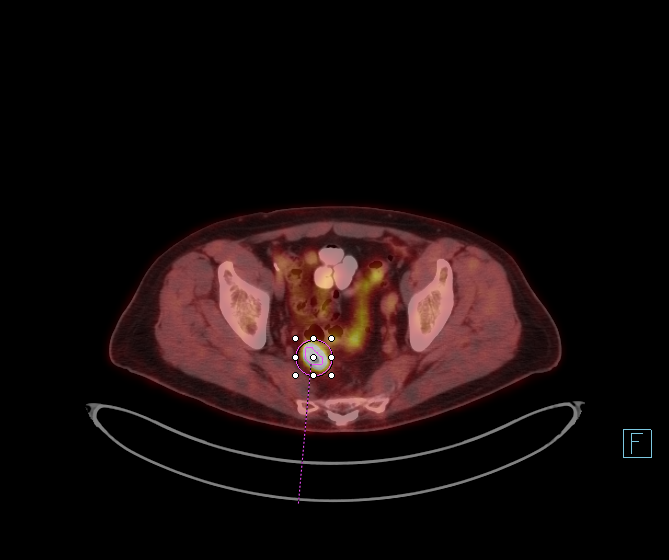

[25 of 25 positions shown; findings below may reference images not displayed]

FINDINGS: NECK

No hypermetabolic lymph nodes in the neck.

CHEST

No hypermetabolic mediastinal or hilar nodes. No suspicious
pulmonary nodules on the CT scan. Linear nodule in the RIGHT upper
lobe on image 68, series 4 appears partially calcified and therefore
likely benign

ABDOMEN/PELVIS

Segment of hypermetabolic thickening in the proximal rectum with
intense metabolic activity (SUV max 23.1). This thickening occurs
over a 4 cm segment at the level the mid sacrum and approximately 8
cm from the anal verge.

There are no hypermetabolic lymph nodes in the perirectal fat. No
hypermetabolic iliac lymph nodes.

No hypermetabolic retroperitoneal lymph nodes. No abnormal activity
in the liver. Low-density lesion in the inferior RIGHT hepatic lobe
measures 8 mm on image 111 without associated metabolic activity.

There are bilateral simple fluid attenuation lesions associated with
kidneys. Adrenal glands are normal.

SKELETON

No focal hypermetabolic activity to suggest skeletal metastasis.
IMPRESSION: 1. Hypermetabolic lesion in the proximal rectum consistent with
known carcinoma.
2. No evidence of local metastatic adenopathy.
3. No evidence of distant metastatic disease

## 2017-05-28 ENCOUNTER — Telehealth: Payer: Self-pay | Admitting: Oncology

## 2017-05-28 NOTE — Telephone Encounter (Signed)
Spoke to patient about appointment 12/6.

## 2017-05-29 ENCOUNTER — Other Ambulatory Visit: Payer: Self-pay | Admitting: Internal Medicine

## 2017-05-29 ENCOUNTER — Other Ambulatory Visit: Payer: Self-pay | Admitting: *Deleted

## 2017-05-29 DIAGNOSIS — E782 Mixed hyperlipidemia: Secondary | ICD-10-CM

## 2017-05-29 MED ORDER — ATORVASTATIN CALCIUM 40 MG PO TABS: ORAL_TABLET | ORAL | 1 refills | Status: DC

## 2017-05-29 MED ORDER — ATORVASTATIN CALCIUM 40 MG PO TABS: 40.0000 mg | ORAL_TABLET | Freq: Every day | ORAL | 1 refills | Status: DC

## 2017-06-04 DIAGNOSIS — R69 Illness, unspecified: Secondary | ICD-10-CM | POA: Diagnosis not present

## 2017-06-27 DIAGNOSIS — R69 Illness, unspecified: Secondary | ICD-10-CM | POA: Diagnosis not present

## 2017-07-12 ENCOUNTER — Other Ambulatory Visit: Payer: Self-pay | Admitting: *Deleted

## 2017-07-12 ENCOUNTER — Encounter: Payer: Self-pay | Admitting: Internal Medicine

## 2017-07-12 ENCOUNTER — Ambulatory Visit (INDEPENDENT_AMBULATORY_CARE_PROVIDER_SITE_OTHER): Payer: Medicare HMO | Admitting: Internal Medicine

## 2017-07-12 VITALS — BP 136/76 | HR 80 | Temp 97.2°F | Resp 18 | Ht 69.0 in | Wt 159.0 lb

## 2017-07-12 DIAGNOSIS — Z136 Encounter for screening for cardiovascular disorders: Secondary | ICD-10-CM

## 2017-07-12 DIAGNOSIS — Z125 Encounter for screening for malignant neoplasm of prostate: Secondary | ICD-10-CM | POA: Diagnosis not present

## 2017-07-12 DIAGNOSIS — I10 Essential (primary) hypertension: Secondary | ICD-10-CM

## 2017-07-12 DIAGNOSIS — E782 Mixed hyperlipidemia: Secondary | ICD-10-CM | POA: Diagnosis not present

## 2017-07-12 DIAGNOSIS — N182 Chronic kidney disease, stage 2 (mild): Secondary | ICD-10-CM

## 2017-07-12 DIAGNOSIS — R6889 Other general symptoms and signs: Secondary | ICD-10-CM

## 2017-07-12 DIAGNOSIS — R7303 Prediabetes: Secondary | ICD-10-CM | POA: Diagnosis not present

## 2017-07-12 DIAGNOSIS — E559 Vitamin D deficiency, unspecified: Secondary | ICD-10-CM

## 2017-07-12 DIAGNOSIS — Z0001 Encounter for general adult medical examination with abnormal findings: Secondary | ICD-10-CM

## 2017-07-12 DIAGNOSIS — K219 Gastro-esophageal reflux disease without esophagitis: Secondary | ICD-10-CM | POA: Diagnosis not present

## 2017-07-12 DIAGNOSIS — Z1211 Encounter for screening for malignant neoplasm of colon: Secondary | ICD-10-CM

## 2017-07-12 DIAGNOSIS — Z79899 Other long term (current) drug therapy: Secondary | ICD-10-CM | POA: Diagnosis not present

## 2017-07-12 DIAGNOSIS — Z85048 Personal history of other malignant neoplasm of rectum, rectosigmoid junction, and anus: Secondary | ICD-10-CM

## 2017-07-12 MED ORDER — TRIAMCINOLONE ACETONIDE 0.1 % EX OINT: 1.0000 "application " | TOPICAL_OINTMENT | Freq: Two times a day (BID) | CUTANEOUS | 3 refills | Status: DC

## 2017-07-12 NOTE — Progress Notes (Signed)
[facility]   Keith [name], M.D.     Keith [name]. Keith [name], P.A.-C Keith [name], Keith [name]                [address] [place] [address] Telephone [phone] Telefax [phone] Annual  Screening/Preventative Visit  & Comprehensive Evaluation & Examination     This very nice 83 y.o. MWM presents for a Screening/Preventative Visit & comprehensive evaluation and management of multiple medical co-morbidities.  Patient has been followed for HTN, Prediabetes, Hyperlipidemia and Vitamin D Deficiency.      Patient  underwent anterior  resection of a rectal cancer (Oct 2016)  followed by Chemotx w/Xeloda  from 08/2015 - 01/2016 (Dr Benay Spice) and had a Negative PET scan in Sept 2016.  In Nov 2017 had a negative Colonoscopy recommending 3 yr f/u. In Dec 2017,  he had Negative Chest & Abd/Pelvic CT scans.     Patient has hx/o labile HTN followed expectantly since 1981 when he presented with ACS undergoing PTCA and has done well since. Cardiolite was negative in 2008 (EF 86%).  Patient's BP has been controlled at home.  Today's BP is at goal - 136/76. Patient denies any cardiac symptoms as chest pain, palpitations, shortness of breath, dizziness or ankle swelling. Patient has GERD quiescent on maintenance Ranitidine.      Patient's hyperlipidemia is controlled with diet and medications. Patient denies myalgias or other medication SE's. Last lipids were at goal: Lab Results  Component Value Date   CHOL 145 07/12/2017   HDL 53 07/12/2017   LDLCALC 74 12/13/2016   TRIG 139 07/12/2017   CHOLHDL 2.7 07/12/2017      Patient has prediabetes (A1c 5.9% / 2014)  and patient denies reactive hypoglycemic symptoms, visual blurring, diabetic polys or paresthesias. Last A1c was back to normal and at goal: Lab Results  Component Value Date   HGBA1C 5.6 07/12/2017       Finally, patient has history of Vitamin D  Deficiency ('27" / 2009) and last vitamin D was at goal: Lab Results  Component Value Date   VD25OH 64 07/12/2017   Current Outpatient Prescriptions on File Prior to Visit  Medication Sig  . aspirin 81 MG tablet Take 81 mg by mouth every morning.   Marland Kitchen atorvastatin (LIPITOR) 40 MG tablet Take 1 tablet daily for Cholesterol  . Cholecalciferol (VITAMIN D PO) Take 2,000 Int'l Units by mouth every morning.   . loperamide (IMODIUM) 2 MG capsule Take 2 mg by mouth as needed for diarrhea or loose stools.  . Magnesium 250 MG TABS Take 1 tablet by mouth 2 (two) times daily.   . Omega-3 Fatty Acids (FISH OIL PO) Take 1 tablet by mouth every morning.   . ranitidine (ZANTAC) 150 MG tablet Take 1 tablet daily for Indigestion and Acid Reflux   No current facility-administered medications on file prior to visit.    Allergies  Allergen Reactions  . Penicillins    Past Medical History:  Diagnosis Date  . Cancer ([place])    cancer of recto-sigmoid junction-dx 07/2015  . Elevated PSA   . GERD (gastroesophageal reflux disease)   . Hemorrhoids   . Hyperlipidemia   . Hypertension   . Pneumonia    age 53  . Vitamin D deficiency    Health Maintenance  Topic Date Due  . TETANUS/TDAP  02/10/2020  .  INFLUENZA VACCINE  Completed  . PNA vac Low Risk Adult  Completed   Immunization History  Administered Date(s) Administered  . Influenza Split 07/29/2013, 06/22/2015  . Influenza, High Dose Seasonal PF 06/15/2014, 06/12/2016  . Influenza-Unspecified 06/04/2017  . Pneumococcal Conjugate-13 06/15/2014  . Pneumococcal Polysaccharide-23 03/12/2016  . Td 02/09/2010  . Zoster 10/08/2000   Past Surgical History:  Procedure Laterality Date  . EUS N/A 06/29/2015   Procedure: LOWER ENDOSCOPIC ULTRASOUND (EUS);  Surgeon: Arta Silence, MD;  Location: [facility];  Service: Endoscopy;  Laterality: N/A;  . HERNIA REPAIR     left inguinal  . XI ROBOTIC ASSISTED LOWER ANTERIOR RESECTION N/A 08/02/2015    Procedure: XI ROBOTIC ASSISTED LOWER ANTERIOR RESECTION;  Surgeon: Stark Klein, MD;  Location: [facility];  Service: General;  Laterality: N/A;   Family History  Problem Relation Age of Onset  . Heart disease Mother   . Heart disease Father    Social History   Social History  . Marital status: Married    Spouse name: N/A  . Number of children: N/A  . Years of education: N/A   Occupational History  . Not on file.   Social History Main Topics  . Smoking status: Former Smoker    Quit date: 10/04/1965  . Smokeless tobacco: Never Used  . Alcohol use 0.0 oz/week     Comment: rarely  . Drug use: No  . Sexual activity: Not on file   Social History Narrative   Married, wife Pamala Hurry   Retired    ROS Constitutional: Denies fever, chills, weight loss/gain, headaches, insomnia,  night sweats or change in appetite. Does c/o fatigue. Eyes: Denies redness, blurred vision, diplopia, discharge, itchy or watery eyes.  ENT: Denies discharge, congestion, post nasal drip, epistaxis, sore throat, earache, hearing loss, dental pain, Tinnitus, Vertigo, Sinus pain or snoring.  Cardio: Denies chest pain, palpitations, irregular heartbeat, syncope, dyspnea, diaphoresis, orthopnea, PND, claudication or edema Respiratory: denies cough, dyspnea, DOE, pleurisy, hoarseness, laryngitis or wheezing.  Gastrointestinal: Denies dysphagia, heartburn, reflux, water brash, pain, cramps, nausea, vomiting, bloating, diarrhea, constipation, hematemesis, melena, hematochezia, jaundice or hemorrhoids Genitourinary: Denies dysuria, frequency, urgency, nocturia, hesitancy, discharge, hematuria or flank pain Musculoskeletal: Denies arthralgia, myalgia, stiffness, Jt. Swelling, pain, limp or strain/sprain. Denies Falls. Skin: Denies puritis, rash, hives, warts, acne, eczema or change in skin lesion Neuro: No weakness, tremor, incoordination, spasms, paresthesia or pain Psychiatric: Denies confusion, memory loss or sensory  loss. Denies Depression. Endocrine: Denies change in weight, skin, hair change, nocturia, and paresthesia, diabetic polys, visual blurring or hyper / hypo glycemic episodes.  Heme/Lymph: No excessive bleeding, bruising or enlarged lymph nodes.  Physical Exam  BP 136/76   Pulse 80   Temp (!) 97.2 F (36.2 C)   Resp 18   Ht 5\' 9"  (1.753 m)   Wt 159 lb (72.1 kg)   BMI 23.48 kg/m   General Appearance: Well nourished and well groomed and in no apparent distress.  Eyes: PERRLA, EOMs, conjunctiva no swelling or erythema, normal fundi and vessels. Sinuses: No frontal/maxillary tenderness ENT/Mouth: EACs patent / TMs  nl. Nares clear without erythema, swelling, mucoid exudates. Oral hygiene is good. No erythema, swelling, or exudate. Tongue normal, non-obstructing. Tonsils not swollen or erythematous. Hearing normal.  Neck: Supple, thyroid normal. No bruits, nodes or JVD. Respiratory: Respiratory effort normal.  BS equal and clear bilateral without rales, rhonci, wheezing or stridor. Cardio: Heart sounds are normal with regular rate and rhythm and no murmurs, rubs or gallops.  Peripheral pulses are normal and equal bilaterally without edema. No aortic or femoral bruits. Chest: symmetric with normal excursions and percussion.  Abdomen: Soft, with Nl bowel sounds. Nontender, no guarding, rebound, hernias, masses, or organomegaly.  Lymphatics: Non tender without lymphadenopathy.  Genitourinary: Deferred to Colonoscopy.  Musculoskeletal: Full ROM all peripheral extremities, joint stability, 5/5 strength, and normal gait. Skin: Warm and dry without rashes, lesions, cyanosis, clubbing or  ecchymosis.  Neuro: Cranial nerves intact, reflexes equal bilaterally. Normal muscle tone, no cerebellar symptoms. Sensation intact.  Pysch: Alert and oriented X 3 with normal affect, insight and judgment appropriate.   Assessment and Plan  1. Annual Preventative/Screening Exam   2. Essential hypertension  -  EKG 12-Lead - [place], RETROPERITNL ABD,  LTD - Urinalysis, Routine w reflex microscopic - Microalbumin / creatinine urine ratio - CBC with Differential/Platelet - BASIC METABOLIC PANEL WITH GFR - Magnesium - TSH  3. Hyperlipidemia, mixed  - EKG 12-Lead - [place], RETROPERITNL ABD,  LTD - Hepatic function panel - Lipid panel - TSH  4. Prediabetes  - EKG 12-Lead - [place], RETROPERITNL ABD,  LTD - Hemoglobin A1c - Insulin, random  5. Vitamin D deficiency  - VITAMIN D 25 Hydroxy   6. Gastroesophageal reflux disease  - CBC with Differential/Platelet  7. Chronic kidney disease (CKD), stage II (mild)  - Urinalysis, Routine w reflex microscopic - Microalbumin / creatinine urine ratio - BASIC METABOLIC PANEL WITH GFR  8. Colon cancer screening  - POC Hemoccult Bld/Stl   9. Prostate cancer screening  - PSA  10. Screening for ischemic heart disease  - EKG 12-Lead  11. Screening for AAA (aortic abdominal aneurysm)  - [place], RETROPERITNL ABD,  LTD  12. History of rectal cancer  - POC Hemoccult Bld/Stl   13. Medication management  - Urinalysis, Routine w reflex microscopic - Microalbumin / creatinine urine ratio - PSA - CBC with Differential/Platelet - BASIC METABOLIC PANEL WITH GFR - Hepatic function panel - Magnesium - Lipid panel - TSH - Hemoglobin A1c - Insulin, random - VITAMIN D 25 Hydroxy       Patient was counseled in prudent diet, weight control to achieve/maintain BMI less than 25, BP monitoring, regular exercise and medications as discussed.  Discussed med effects and SE's. Routine screening labs and tests as requested with regular follow-up as recommended. Over 40 minutes of exam, counseling, chart review and high complex critical decision making was performed

## 2017-07-12 NOTE — Patient Instructions (Signed)

## 2017-07-13 ENCOUNTER — Encounter: Payer: Self-pay | Admitting: Internal Medicine

## 2017-07-15 LAB — URINALYSIS, ROUTINE W REFLEX MICROSCOPIC
BILIRUBIN URINE: NEGATIVE
Glucose, UA: NEGATIVE
Hgb urine dipstick: NEGATIVE
Ketones, ur: NEGATIVE
LEUKOCYTES UA: NEGATIVE
NITRITE: NEGATIVE
PROTEIN: NEGATIVE
SPECIFIC GRAVITY, URINE: 1.014 (ref 1.001–1.03)
pH: 6.5 (ref 5.0–8.0)

## 2017-07-15 LAB — BASIC METABOLIC PANEL WITH GFR
BUN: 15 mg/dL (ref 7–25)
CALCIUM: 9.3 mg/dL (ref 8.6–10.3)
CHLORIDE: 104 mmol/L (ref 98–110)
CO2: 30 mmol/L (ref 20–32)
CREATININE: 0.84 mg/dL (ref 0.70–1.11)
GFR, EST AFRICAN AMERICAN: 94 mL/min/{1.73_m2} (ref >=60)
GFR, EST NON AFRICAN AMERICAN: 81 mL/min/{1.73_m2} (ref >=60)
Glucose, Bld: 118 mg/dL — ABNORMAL HIGH (ref 65–99)
Potassium: 4.6 mmol/L (ref 3.5–5.3)
Sodium: 140 mmol/L (ref 135–146)

## 2017-07-15 LAB — CBC WITH DIFFERENTIAL/PLATELET
BASOS PCT: 0.7 %
Basophils Absolute: 38 cells/uL (ref 0–200)
Eosinophils Absolute: 270 cells/uL (ref 15–500)
Eosinophils Relative: 5 %
HEMATOCRIT: 43.6 % (ref 38.5–50.0)
HEMOGLOBIN: 14.6 g/dL (ref 13.2–17.1)
LYMPHS ABS: 1798 {cells}/uL (ref 850–3900)
MCH: 29.7 pg (ref 27.0–33.0)
MCHC: 33.5 g/dL (ref 32.0–36.0)
MCV: 88.6 fL (ref 80.0–100.0)
MPV: 10.6 fL (ref 7.5–12.5)
Monocytes Relative: 9.1 %
Neutro Abs: 2803 cells/uL (ref 1500–7800)
Neutrophils Relative %: 51.9 %
Platelets: 150 10*3/uL (ref 140–400)
RBC: 4.92 10*6/uL (ref 4.20–5.80)
RDW: 12.8 % (ref 11.0–15.0)
Total Lymphocyte: 33.3 %
WBC: 5.4 10*3/uL (ref 3.8–10.8)
WBCMIX: 491 {cells}/uL (ref 200–950)

## 2017-07-15 LAB — HEPATIC FUNCTION PANEL
AG Ratio: 1.5 (calc) (ref 1.0–2.5)
ALT: 14 U/L (ref 9–46)
AST: 19 U/L (ref 10–35)
Albumin: 3.8 g/dL (ref 3.6–5.1)
Alkaline phosphatase (APISO): 61 U/L (ref 40–115)
BILIRUBIN DIRECT: 0.3 mg/dL — AB (ref 0.0–0.2)
BILIRUBIN TOTAL: 1.4 mg/dL — AB (ref 0.2–1.2)
GLOBULIN: 2.5 g/dL (ref 1.9–3.7)
Indirect Bilirubin: 1.1 mg/dL (calc) (ref 0.2–1.2)
Total Protein: 6.3 g/dL (ref 6.1–8.1)

## 2017-07-15 LAB — LIPID PANEL
Cholesterol: 145 mg/dL (ref <200)
HDL: 53 mg/dL (ref >40)
LDL Cholesterol (Calc): 70 mg/dL (calc)
Non-HDL Cholesterol (Calc): 92 mg/dL (calc) (ref <130)
Total CHOL/HDL Ratio: 2.7 (calc) (ref <5.0)
Triglycerides: 139 mg/dL (ref <150)

## 2017-07-15 LAB — HEMOGLOBIN A1C
Hgb A1c MFr Bld: 5.6 % of total Hgb (ref <5.7)
Mean Plasma Glucose: 114 (calc)
eAG (mmol/L): 6.3 (calc)

## 2017-07-15 LAB — PSA: PSA: 2.6 ng/mL (ref <=4.0)

## 2017-07-15 LAB — MICROALBUMIN / CREATININE URINE RATIO
CREATININE, URINE: 95 mg/dL (ref 20–320)
MICROALB/CREAT RATIO: 3 ug/mg{creat} (ref <30)
Microalb, Ur: 0.3 mg/dL

## 2017-07-15 LAB — VITAMIN D 25 HYDROXY (VIT D DEFICIENCY, FRACTURES): VIT D 25 HYDROXY: 64 ng/mL (ref 30–100)

## 2017-07-15 LAB — INSULIN, RANDOM: Insulin: 39.4 u[IU]/mL — ABNORMAL HIGH (ref 2.0–19.6)

## 2017-07-15 LAB — MAGNESIUM: MAGNESIUM: 1.9 mg/dL (ref 1.5–2.5)

## 2017-07-15 LAB — TSH: TSH: 1.58 m[IU]/L (ref 0.40–4.50)

## 2017-07-23 ENCOUNTER — Other Ambulatory Visit: Payer: Self-pay

## 2017-07-23 ENCOUNTER — Telehealth: Payer: Self-pay

## 2017-07-23 DIAGNOSIS — Z85048 Personal history of other malignant neoplasm of rectum, rectosigmoid junction, and anus: Secondary | ICD-10-CM

## 2017-07-23 DIAGNOSIS — Z1211 Encounter for screening for malignant neoplasm of colon: Secondary | ICD-10-CM

## 2017-07-23 LAB — POC HEMOCCULT BLD/STL (HOME/3-CARD/SCREEN)
Card #2 Fecal Occult Blod, POC: NEGATIVE
FECAL OCCULT BLD: NEGATIVE
Fecal Occult Blood, POC: NEGATIVE

## 2017-07-23 NOTE — Telephone Encounter (Signed)
Call placed to pt as requested. Pt expressed concerns that his CT scans have not been scheduled yet. This RN informed patient that the scans have been ordered but not scheduled yet. This RN informed pt that the issue would be looked into and scheduling would call him with appointment times. Pt verbalizes understanding and agrees with plan of care.

## 2017-07-24 NOTE — Telephone Encounter (Signed)
Left message informing pt that radiology will call him with an appt closer to the Dec due date.

## 2017-09-09 ENCOUNTER — Ambulatory Visit (HOSPITAL_COMMUNITY): Payer: Medicare HMO

## 2017-09-09 ENCOUNTER — Other Ambulatory Visit: Payer: Medicare HMO

## 2017-09-10 ENCOUNTER — Ambulatory Visit (HOSPITAL_COMMUNITY)
Admission: RE | Admit: 2017-09-10 | Discharge: 2017-09-10 | Disposition: A | Discharge status: Home / Self Care | Payer: Medicare HMO | Source: Ambulatory Visit | Attending: Oncology | Admitting: Oncology

## 2017-09-10 ENCOUNTER — Other Ambulatory Visit (HOSPITAL_BASED_OUTPATIENT_CLINIC_OR_DEPARTMENT_OTHER): Payer: Medicare HMO

## 2017-09-10 ENCOUNTER — Ambulatory Visit: Payer: Medicare HMO | Admitting: Oncology

## 2017-09-10 ENCOUNTER — Encounter (HOSPITAL_COMMUNITY): Payer: Self-pay

## 2017-09-10 DIAGNOSIS — C187 Malignant neoplasm of sigmoid colon: Secondary | ICD-10-CM

## 2017-09-10 DIAGNOSIS — Z9221 Personal history of antineoplastic chemotherapy: Secondary | ICD-10-CM | POA: Diagnosis not present

## 2017-09-10 DIAGNOSIS — Z9049 Acquired absence of other specified parts of digestive tract: Secondary | ICD-10-CM | POA: Diagnosis not present

## 2017-09-10 DIAGNOSIS — C189 Malignant neoplasm of colon, unspecified: Secondary | ICD-10-CM | POA: Diagnosis present

## 2017-09-10 DIAGNOSIS — R918 Other nonspecific abnormal finding of lung field: Secondary | ICD-10-CM | POA: Diagnosis not present

## 2017-09-10 DIAGNOSIS — Z85038 Personal history of other malignant neoplasm of large intestine: Secondary | ICD-10-CM | POA: Diagnosis not present

## 2017-09-10 DIAGNOSIS — R109 Unspecified abdominal pain: Secondary | ICD-10-CM | POA: Diagnosis not present

## 2017-09-10 LAB — BASIC METABOLIC PANEL
Anion Gap: 8 mEq/L (ref 3–11)
BUN: 13.3 mg/dL (ref 7.0–26.0)
CHLORIDE: 103 meq/L (ref 98–109)
CO2: 28 meq/L (ref 22–29)
CREATININE: 0.8 mg/dL (ref 0.7–1.3)
Calcium: 9.3 mg/dL (ref 8.4–10.4)
EGFR: >60 mL/min/{1.73_m2} (ref >60)
GLUCOSE: 90 mg/dL (ref 70–140)
POTASSIUM: 4.3 meq/L (ref 3.5–5.1)
Sodium: 140 mEq/L (ref 136–145)

## 2017-09-10 LAB — CEA (IN HOUSE-CHCC): CEA (CHCC-In House): 1.7 ng/mL (ref 0.00–5.00)

## 2017-09-10 MED ORDER — IOPAMIDOL (ISOVUE-300) INJECTION 61%
INTRAVENOUS | Status: AC
  Filled 2017-09-10: qty 30

## 2017-09-10 MED ORDER — IOPAMIDOL (ISOVUE-300) INJECTION 61%
INTRAVENOUS | Status: AC
  Administered 2017-09-10: 100 mL via INTRAVENOUS
  Filled 2017-09-10: qty 100

## 2017-09-10 MED ORDER — IOPAMIDOL (ISOVUE-300) INJECTION 61%
100.0000 mL | Freq: Once | INTRAVENOUS | Status: AC | PRN
  Administered 2017-09-10: 100 mL via INTRAVENOUS

## 2017-09-10 MED ORDER — IOPAMIDOL (ISOVUE-300) INJECTION 61%
30.0000 mL | Freq: Once | INTRAVENOUS | Status: AC | PRN
  Administered 2017-09-10: 30 mL via ORAL

## 2017-09-12 ENCOUNTER — Ambulatory Visit: Payer: Medicare HMO | Admitting: Oncology

## 2017-09-12 ENCOUNTER — Encounter: Payer: Self-pay | Admitting: Oncology

## 2017-09-12 ENCOUNTER — Other Ambulatory Visit: Payer: Self-pay

## 2017-09-12 VITALS — BP 138/58 | HR 55 | Temp 97.5°F | Resp 17 | Ht 69.0 in | Wt 159.4 lb

## 2017-09-12 DIAGNOSIS — M25511 Pain in right shoulder: Secondary | ICD-10-CM

## 2017-09-12 DIAGNOSIS — C187 Malignant neoplasm of sigmoid colon: Secondary | ICD-10-CM

## 2017-09-12 DIAGNOSIS — R911 Solitary pulmonary nodule: Secondary | ICD-10-CM

## 2017-09-12 NOTE — Progress Notes (Signed)
[facility] OFFICE PROGRESS NOTE   Diagnosis: Colon cancer  INTERVAL HISTORY:   Keith [name] returns as scheduled.  He feels well.  Good appetite.  He reports more frequent bowel movements since undergoing colon surgery.  He has intermittent discomfort at the right shoulder.  This is worse with certain positions and movement.  Objective:  Vital signs in last 24 hours:  Blood pressure (!) 138/58, pulse (!) 55, temperature (!) 97.5 F (36.4 C), temperature source Oral, resp. rate 17, height '5\' 9"'  (1.753 m), weight 159 lb 6.4 oz (72.3 kg), SpO2 99 %.    HEENT: Neck without mass Lymphatics: No cervical, supraclavicular, axillary, or inguinal nodes Resp: Lungs clear bilaterally Cardio: Regular rate and rhythm GI: No hepatosplenomegaly, nontender, no mass Vascular: No leg edema Musculoskeletal no pain with motion at the right shoulder, no tenderness at the right anterior chest wall or clavicle  Lab Results:   Lab Results  Component Value Date   CEA1 1.70 09/10/2017     Imaging:  Ct Chest W Contrast  Result Date: 09/11/2017 CLINICAL DATA:  Keith [name].states hx of colon ca dx'd 2016, chemo complete, colon resection, Keith [name]. Denies any chest/abd/pelvic complaints Bun 13 cr 0.8 09-10-2017 100 ml isovue 300 EXAM: CT CHEST, ABDOMEN, AND PELVIS WITH CONTRAST TECHNIQUE: Multidetector CT imaging of the chest, abdomen and pelvis was performed following the standard protocol during bolus administration of intravenous contrast. CONTRAST:  100 mL Isovue COMPARISON:  CT 09/22/2016 FINDINGS: CT CHEST FINDINGS Cardiovascular: No significant vascular findings. Normal heart size. No pericardial effusion. Mediastinum/Nodes: No axillary supraclavicular adenopathy. No mediastinal hilar adenopathy. No pericardial fluid. Esophagus normal. Lungs/Pleura: Linear calcification is RIGHT upper lobe is unchanged. Previous described nodule in the lateral RIGHT upper lobe since resolved. No new pulmonary nodules.  Musculoskeletal: No aggressive osseous lesion. CT ABDOMEN AND PELVIS FINDINGS Hepatobiliary: Small cysts in the RIGHT hepatic lobe. No biliary duct dilatation. Normal gallbladder Pancreas: Pancreas is normal. No ductal dilatation. No pancreatic inflammation. Spleen: Normal spleen Adrenals/urinary tract: Adrenal glands and kidneys are normal. The ureters and bladder normal. Stomach/Bowel: Stomach, small bowel, appendix, and cecum are normal. Colon normal. Prior sigmoid colectomy. Anastomosis the proximal rectum without complication. No pelvic lymphadenopathy. Vascular/Lymphatic: Abdominal aorta is normal caliber. There is no retroperitoneal or periportal lymphadenopathy. No pelvic lymphadenopathy. Reproductive: Prostate normal Other: No peritoneal nodularity Musculoskeletal: No aggressive osseous lesion. IMPRESSION: Chest Impression: 1. Resolution of RIGHT upper lobe pulmonary nodule. No new pulmonary nodules. 2. No lymphadenopathy Abdomen / Pelvis Impression: 1. No evidence of colorectal carcinoma local recurrence or metastasis in the abdomen pelvis. 2. Sigmoid resection without complication.  No lymphadenopathy. 3. Electronically Signed   By: Suzy Bouchard M.D.   On: 09/11/2017 10:37   Ct Abdomen Pelvis W Contrast  Result Date: 09/11/2017 CLINICAL DATA:  Keith [name].states hx of colon ca dx'd 2016, chemo complete, colon resection, Keith [name]. Denies any chest/abd/pelvic complaints Bun 13 cr 0.8 09-10-2017 100 ml isovue 300 EXAM: CT CHEST, ABDOMEN, AND PELVIS WITH CONTRAST TECHNIQUE: Multidetector CT imaging of the chest, abdomen and pelvis was performed following the standard protocol during bolus administration of intravenous contrast. CONTRAST:  100 mL Isovue COMPARISON:  CT 09/22/2016 FINDINGS: CT CHEST FINDINGS Cardiovascular: No significant vascular findings. Normal heart size. No pericardial effusion. Mediastinum/Nodes: No axillary supraclavicular adenopathy. No mediastinal hilar adenopathy. No pericardial fluid.  Esophagus normal. Lungs/Pleura: Linear calcification is RIGHT upper lobe is unchanged. Previous described nodule in the lateral RIGHT upper lobe since resolved. No new pulmonary nodules. Musculoskeletal: No  aggressive osseous lesion. CT ABDOMEN AND PELVIS FINDINGS Hepatobiliary: Small cysts in the RIGHT hepatic lobe. No biliary duct dilatation. Normal gallbladder Pancreas: Pancreas is normal. No ductal dilatation. No pancreatic inflammation. Spleen: Normal spleen Adrenals/urinary tract: Adrenal glands and kidneys are normal. The ureters and bladder normal. Stomach/Bowel: Stomach, small bowel, appendix, and cecum are normal. Colon normal. Prior sigmoid colectomy. Anastomosis the proximal rectum without complication. No pelvic lymphadenopathy. Vascular/Lymphatic: Abdominal aorta is normal caliber. There is no retroperitoneal or periportal lymphadenopathy. No pelvic lymphadenopathy. Reproductive: Prostate normal Other: No peritoneal nodularity Musculoskeletal: No aggressive osseous lesion. IMPRESSION: Chest Impression: 1. Resolution of RIGHT upper lobe pulmonary nodule. No new pulmonary nodules. 2. No lymphadenopathy Abdomen / Pelvis Impression: 1. No evidence of colorectal carcinoma local recurrence or metastasis in the abdomen pelvis. 2. Sigmoid resection without complication.  No lymphadenopathy. 3. Electronically Signed   By: Suzy Bouchard M.D.   On: 09/11/2017 10:37    Medications: I have reviewed the patient's current medications.  Assessment/Plan: 1. Adenocarcinoma at 20 cm from the anal verge, most consistent with sigmoid colon cancer ? Staging PET scan 07/05/2015 with no evidence of metastatic disease ? Endoscopic ultrasound 06/29/2015 consistent with a T2N0 tumor ? Status post a robotic low anterior resection 08/02/2015 with tumor confirmed above the peritoneal reflection, pT2 vs pT3,N1b, focally positive circumferential margin, no loss of mismatch repair protein expression ? Cycle 1 adjuvant  Xeloda 09/02/2015 ? Cycle 2 adjuvant Xeloda 09/23/2015 ? Cycle 3 adjuvant Xeloda 10/14/2015; dose reduced to 1000 mg in the morning and 500 mg in the evening for 14 days followed by 7 days off (skin rash, hand-foot syndrome) ? Cycle 4 adjuvant Xeloda 11/04/2015 ? Cycle 5 adjuvant Xeloda 11/25/2015 ? Cycle 6 adjuvant Xeloda 12/16/2015 ? Cycle 7 adjuvant Xeloda 01/06/2016 ? Cycle 8 adjuvant Xeloda 01/27/2016 ? Colonoscopy 08/20/2016-few diverticula in the rectosigmoid colon. Repeat colonoscopy in 3 years for surveillance. ? CTs chest, abdomen, and pelvis 09/12/2016-negative for metastatic disease, stable right upper lobe nodule ? CTs 09/11/2017- resolution of a previously noted right upper lobe nodule, no evidence of metastatic disease  2. History of coronary artery disease  3. History of Mild hyperbilirubinemia -chronic, [place]?   Disposition:  Keith [name] remains in clinical remission from colon cancer.  He will return for an office visit and CEA in 6 months. The right shoulder discomfort is likely related to a benign musculoskeletal condition.  He will follow-up with his primary physician if the pain persist.  15 minutes were spent with the patient today.  The majority of the time was used for counseling and coordination of care.  [name], MD  09/12/2017  12:19 PM

## 2017-09-23 ENCOUNTER — Ambulatory Visit: Payer: Medicare HMO | Admitting: Oncology

## 2017-10-18 ENCOUNTER — Other Ambulatory Visit: Payer: Self-pay | Admitting: Internal Medicine

## 2017-10-22 ENCOUNTER — Ambulatory Visit: Payer: Self-pay | Admitting: Adult Health

## 2017-12-30 DIAGNOSIS — R69 Illness, unspecified: Secondary | ICD-10-CM | POA: Diagnosis not present

## 2018-01-03 ENCOUNTER — Other Ambulatory Visit: Payer: Self-pay | Admitting: Internal Medicine

## 2018-01-03 DIAGNOSIS — E782 Mixed hyperlipidemia: Secondary | ICD-10-CM

## 2018-01-03 DIAGNOSIS — K219 Gastro-esophageal reflux disease without esophagitis: Secondary | ICD-10-CM

## 2018-01-22 NOTE — Patient Instructions (Signed)

## 2018-01-22 NOTE — Progress Notes (Signed)
This very nice 84 y.o. MWM  presents for 6 month follow up with HTN, HLD, Pre-Diabetes and Vitamin D Deficiency.      In 2016, patient had Colon Cancer removed by Low Anterior  Robotic Resection and continues active active surveillance w/Dr Benay Spice. Chest & Abd/Pelvic CT scans in Dec 2018 were neg for recurrent disease. Last Colonoscopy 08/2016 (Dr Amedeo Plenty / [facility]) recommending repeat in Nov 2020.      Patient is treated for HTN (1981) s/p PTCA with a negative Cardiolite in 2008 and he's done well since.  BP has been controlled at home. Today's BP is at goal - 124/60. Patient has had no complaints of any cardiac type chest pain, palpitations, dyspnea / orthopnea / PND, dizziness, claudication, or dependent edema.     Hyperlipidemia is controlled with diet & meds. Patient denies myalgias or other med SE's. Last Lipids were at goal: Lab Results  Component Value Date   CHOL 128 01/23/2018   HDL 46 01/23/2018   LDLCALC 63 01/23/2018   TRIG 109 01/23/2018   CHOLHDL 2.8 01/23/2018      Also, the patient has history of PreDiabetes (A1c 5.9%/2014) and has had no symptoms of reactive hypoglycemia, diabetic polys, paresthesias or visual blurring.  Last A1c was Normal & at goal: Lab Results  Component Value Date   HGBA1C 5.8 (H) 01/23/2018      Further, the patient also has history of Vitamin D Deficiency ("27"/2009)  and supplements vitamin D without any suspected side-effects. Last vitamin D was at goal: Lab Results  Component Value Date   VD25OH 71 01/23/2018   Current Outpatient Medications on File Prior to Visit  Medication Sig  . aspirin 81 MG tablet Take 81 mg by mouth every morning.   Marland Kitchen atorvastatin (LIPITOR) 40 MG tablet TAKE 1 TABLET DAILY FOR    CHOLESTEROL  . Cholecalciferol (VITAMIN D PO) Take 2,000 Int'l Units by mouth every morning.   . loperamide (IMODIUM) 2 MG capsule Take 2 mg by mouth as needed for diarrhea or loose stools.  . Magnesium 250 MG TABS Take 1 tablet by  mouth 2 (two) times daily.   . Omega-3 Fatty Acids (FISH OIL PO) Take 1 tablet by mouth every morning.   . ranitidine (ZANTAC) 150 MG tablet TAKE 1 TABLET DAILY FOR    INDIGESTION AND ACID REFLUX  . triamcinolone ointment (KENALOG) 0.1 % Apply 1 application topically 2 (two) times daily.   No current facility-administered medications on file prior to visit.    Allergies  Allergen Reactions  . Penicillins    PMHx:   Past Medical History:  Diagnosis Date  . Cancer ([place])    cancer of recto-sigmoid junction-dx 07/2015  . Elevated PSA   . GERD (gastroesophageal reflux disease)   . Hemorrhoids   . Hyperlipidemia   . Hypertension   . Pneumonia    age 15  . Vitamin D deficiency    Immunization History  Administered Date(s) Administered  . Influenza Split 07/29/2013, 06/22/2015  . Influenza, High Dose Seasonal PF 06/15/2014, 06/12/2016  . Influenza-Unspecified 06/04/2017  . Pneumococcal Conjugate-13 06/15/2014  . Pneumococcal Polysaccharide-23 03/12/2016  . Td 02/09/2010  . Zoster 10/08/2000   Past Surgical History:  Procedure Laterality Date  . EUS N/A 06/29/2015   Procedure: LOWER ENDOSCOPIC ULTRASOUND (EUS);  Surgeon: Arta Silence, MD;  Location: [facility];  Service: Endoscopy;  Laterality: N/A;  . HERNIA REPAIR     left inguinal  .  XI ROBOTIC ASSISTED LOWER ANTERIOR RESECTION N/A 08/02/2015   Procedure: XI ROBOTIC ASSISTED LOWER ANTERIOR RESECTION;  Surgeon: Stark Klein, MD;  Location: [facility];  Service: General;  Laterality: N/A;   FHx:    Reviewed / unchanged  SHx:    Reviewed / unchanged  Systems Review:  Constitutional: Denies fever, chills, wt changes, headaches, insomnia, fatigue, night sweats, change in appetite. Eyes: Denies redness, blurred vision, diplopia, discharge, itchy, watery eyes.  ENT: Denies discharge, congestion, post nasal drip, epistaxis, sore throat, earache, hearing loss, dental pain, tinnitus, vertigo, sinus pain, snoring.  CV: Denies chest  pain, palpitations, irregular heartbeat, syncope, dyspnea, diaphoresis, orthopnea, PND, claudication or edema. Respiratory: denies cough, dyspnea, DOE, pleurisy, hoarseness, laryngitis, wheezing.  Gastrointestinal: Denies dysphagia, odynophagia, heartburn, reflux, water brash, abdominal pain or cramps, nausea, vomiting, bloating, diarrhea, constipation, hematemesis, melena, hematochezia  or hemorrhoids. Genitourinary: Denies dysuria, frequency, urgency, nocturia, hesitancy, discharge, hematuria or flank pain. Musculoskeletal: Denies arthralgias, myalgias, stiffness, jt. swelling, pain, limping or strain/sprain.  Skin: Denies pruritus, rash, hives, warts, acne, eczema or change in skin lesion(s). Neuro: No weakness, tremor, incoordination, spasms, paresthesia or pain. Psychiatric: Denies confusion, memory loss or sensory loss. Endo: Denies change in weight, skin or hair change.  Heme/Lymph: No excessive bleeding, bruising or enlarged lymph nodes.  Physical Exam  BP 124/60   Pulse 60   Temp (!) 97.1 F (36.2 C)   Resp 16   Ht 5\' 9"  (1.753 m)   Wt 162 lb 6.4 oz (73.7 kg)   BMI 23.98 kg/m   Appears  well nourished, well groomed  and in no distress.  Eyes: PERRLA, EOMs, conjunctiva no swelling or erythema. Sinuses: No frontal/maxillary tenderness ENT/Mouth: EAC's clear, TM's nl w/o erythema, bulging. Nares clear w/o erythema, swelling, exudates. Oropharynx clear without erythema or exudates. Oral hygiene is good. Tongue normal, non obstructing. Hearing intact.  Neck: Supple. Thyroid not palpable. Car 2+/2+ without bruits, nodes or JVD. Chest: Respirations nl with BS clear & equal w/o rales, rhonchi, wheezing or stridor.  Cor: Heart sounds normal w/ regular rate and rhythm without sig. murmurs, gallops, clicks or rubs. Peripheral pulses normal and equal  without edema.  Abdomen: Soft & bowel sounds normal. Non-tender w/o guarding, rebound, hernias, masses or organomegaly.  Lymphatics:  Unremarkable.  Musculoskeletal: Full ROM all peripheral extremities, joint stability, 5/5 strength and normal gait.  Skin: Warm, dry without exposed rashes, lesions or ecchymosis apparent.  Neuro: Cranial nerves intact, reflexes equal bilaterally. Sensory-motor testing grossly intact. Tendon reflexes grossly intact.  Pysch: Alert & oriented x 3.  Insight and judgement nl & appropriate. No ideations.  Assessment and Plan:  1. Essential hypertension  - Continue medication, monitor blood pressure at home.  - Continue DASH diet.  Reminder to go to the ER if any CP,  SOB, nausea, dizziness, severe HA, changes vision/speech.  - CBC with Differential/Platelet - COMPLETE METABOLIC PANEL WITH GFR - Magnesium - TSH  2. Hyperlipidemia, mixed  - Continue diet/meds, exercise,& lifestyle modifications.  - Continue monitor periodic cholesterol/liver & renal functions   - COMPLETE METABOLIC PANEL WITH GFR - Lipid panel - TSH  3. Abnormal glucose  - Continue diet, exercise, lifestyle modifications.  - Monitor appropriate labs.  - Hemoglobin A1c - Insulin, random  4. Vitamin D deficiency  - Continue supplementation.  - VITAMIN D 25 Hydroxyl  5. Gastroesophageal reflux disease  - CBC with Differential/Platelet  6. ASHD (arteriosclerotic heart disease)  - Lipid panel  7. Medication management - CBC  with Differential/Platelet - COMPLETE METABOLIC PANEL WITH GFR - Magnesium - Lipid panel - TSH - Hemoglobin A1c - Insulin, random - VITAMIN D 25 Hydroxyl              Discussed  regular exercise, BP monitoring, weight control to achieve/maintain BMI less than 25 and discussed med and SE's. Recommended labs to assess and monitor clinical status with further disposition pending results of labs. Over 30 minutes of exam, counseling, chart review was performed.

## 2018-01-23 ENCOUNTER — Ambulatory Visit (INDEPENDENT_AMBULATORY_CARE_PROVIDER_SITE_OTHER): Payer: Medicare HMO | Admitting: Internal Medicine

## 2018-01-23 VITALS — BP 124/60 | HR 60 | Temp 97.1°F | Resp 16 | Ht 69.0 in | Wt 162.4 lb

## 2018-01-23 DIAGNOSIS — J40 Bronchitis, not specified as acute or chronic: Secondary | ICD-10-CM | POA: Diagnosis not present

## 2018-01-23 DIAGNOSIS — K219 Gastro-esophageal reflux disease without esophagitis: Secondary | ICD-10-CM | POA: Diagnosis not present

## 2018-01-23 DIAGNOSIS — I10 Essential (primary) hypertension: Secondary | ICD-10-CM | POA: Diagnosis not present

## 2018-01-23 DIAGNOSIS — R7309 Other abnormal glucose: Secondary | ICD-10-CM

## 2018-01-23 DIAGNOSIS — I2510 Atherosclerotic heart disease of native coronary artery without angina pectoris: Secondary | ICD-10-CM | POA: Diagnosis not present

## 2018-01-23 DIAGNOSIS — J0140 Acute pansinusitis, unspecified: Secondary | ICD-10-CM

## 2018-01-23 DIAGNOSIS — E559 Vitamin D deficiency, unspecified: Secondary | ICD-10-CM

## 2018-01-23 DIAGNOSIS — Z79899 Other long term (current) drug therapy: Secondary | ICD-10-CM

## 2018-01-23 DIAGNOSIS — E782 Mixed hyperlipidemia: Secondary | ICD-10-CM | POA: Diagnosis not present

## 2018-01-23 MED ORDER — PREDNISONE 20 MG PO TABS: ORAL_TABLET | ORAL | 0 refills | Status: DC

## 2018-01-23 MED ORDER — AZITHROMYCIN 250 MG PO TABS: ORAL_TABLET | ORAL | 1 refills | Status: DC

## 2018-01-24 LAB — CBC WITH DIFFERENTIAL/PLATELET
Basophils Absolute: 20 cells/uL (ref 0–200)
Basophils Relative: 0.5 %
Eosinophils Absolute: 212 cells/uL (ref 15–500)
Eosinophils Relative: 5.3 %
HCT: 42.8 % (ref 38.5–50.0)
Hemoglobin: 14.5 g/dL (ref 13.2–17.1)
Lymphs Abs: 1012 cells/uL (ref 850–3900)
MCH: 29.8 pg (ref 27.0–33.0)
MCHC: 33.9 g/dL (ref 32.0–36.0)
MCV: 87.9 fL (ref 80.0–100.0)
MPV: 10.9 fL (ref 7.5–12.5)
Monocytes Relative: 12 %
NEUTROS PCT: 56.9 %
Neutro Abs: 2276 cells/uL (ref 1500–7800)
PLATELETS: 142 10*3/uL (ref 140–400)
RBC: 4.87 10*6/uL (ref 4.20–5.80)
RDW: 12.6 % (ref 11.0–15.0)
TOTAL LYMPHOCYTE: 25.3 %
WBC: 4 10*3/uL (ref 3.8–10.8)
WBCMIX: 480 {cells}/uL (ref 200–950)

## 2018-01-24 LAB — LIPID PANEL
CHOLESTEROL: 128 mg/dL (ref <200)
HDL: 46 mg/dL (ref >40)
LDL CHOLESTEROL (CALC): 63 mg/dL
Non-HDL Cholesterol (Calc): 82 mg/dL (calc) (ref <130)
TRIGLYCERIDES: 109 mg/dL (ref <150)
Total CHOL/HDL Ratio: 2.8 (calc) (ref <5.0)

## 2018-01-24 LAB — COMPLETE METABOLIC PANEL WITH GFR
AG Ratio: 1.5 (calc) (ref 1.0–2.5)
ALKALINE PHOSPHATASE (APISO): 71 U/L (ref 40–115)
ALT: 21 U/L (ref 9–46)
AST: 26 U/L (ref 10–35)
Albumin: 3.8 g/dL (ref 3.6–5.1)
BUN: 10 mg/dL (ref 7–25)
CO2: 31 mmol/L (ref 20–32)
Calcium: 9.2 mg/dL (ref 8.6–10.3)
Chloride: 102 mmol/L (ref 98–110)
Creat: 0.7 mg/dL (ref 0.70–1.11)
GFR, Est African American: 101 mL/min/{1.73_m2} (ref >=60)
GFR, Est Non African American: 87 mL/min/{1.73_m2} (ref >=60)
GLUCOSE: 126 mg/dL — AB (ref 65–99)
Globulin: 2.6 g/dL (calc) (ref 1.9–3.7)
Potassium: 4.1 mmol/L (ref 3.5–5.3)
Sodium: 139 mmol/L (ref 135–146)
Total Bilirubin: 1.1 mg/dL (ref 0.2–1.2)
Total Protein: 6.4 g/dL (ref 6.1–8.1)

## 2018-01-24 LAB — HEMOGLOBIN A1C
Hgb A1c MFr Bld: 5.8 % of total Hgb — ABNORMAL HIGH (ref <5.7)
MEAN PLASMA GLUCOSE: 120 (calc)
eAG (mmol/L): 6.6 (calc)

## 2018-01-24 LAB — INSULIN, RANDOM: Insulin: 55.4 u[IU]/mL — ABNORMAL HIGH (ref 2.0–19.6)

## 2018-01-24 LAB — VITAMIN D 25 HYDROXY (VIT D DEFICIENCY, FRACTURES): VIT D 25 HYDROXY: 71 ng/mL (ref 30–100)

## 2018-01-24 LAB — TSH: TSH: 1.67 mIU/L (ref 0.40–4.50)

## 2018-01-24 LAB — MAGNESIUM: MAGNESIUM: 1.9 mg/dL (ref 1.5–2.5)

## 2018-01-25 ENCOUNTER — Encounter: Payer: Self-pay | Admitting: Internal Medicine

## 2018-02-05 DIAGNOSIS — H524 Presbyopia: Secondary | ICD-10-CM | POA: Diagnosis not present

## 2018-02-06 DIAGNOSIS — K219 Gastro-esophageal reflux disease without esophagitis: Secondary | ICD-10-CM | POA: Diagnosis not present

## 2018-02-06 DIAGNOSIS — Z8261 Family history of arthritis: Secondary | ICD-10-CM | POA: Diagnosis not present

## 2018-02-06 DIAGNOSIS — Z880 Allergy status to penicillin: Secondary | ICD-10-CM | POA: Diagnosis not present

## 2018-02-06 DIAGNOSIS — Z808 Family history of malignant neoplasm of other organs or systems: Secondary | ICD-10-CM | POA: Diagnosis not present

## 2018-02-06 DIAGNOSIS — Z85038 Personal history of other malignant neoplasm of large intestine: Secondary | ICD-10-CM | POA: Diagnosis not present

## 2018-02-06 DIAGNOSIS — Z87891 Personal history of nicotine dependence: Secondary | ICD-10-CM | POA: Diagnosis not present

## 2018-02-06 DIAGNOSIS — E785 Hyperlipidemia, unspecified: Secondary | ICD-10-CM | POA: Diagnosis not present

## 2018-02-06 DIAGNOSIS — Z7982 Long term (current) use of aspirin: Secondary | ICD-10-CM | POA: Diagnosis not present

## 2018-03-13 ENCOUNTER — Telehealth: Payer: Self-pay | Admitting: Oncology

## 2018-03-13 ENCOUNTER — Inpatient Hospital Stay: Payer: Medicare HMO

## 2018-03-13 ENCOUNTER — Inpatient Hospital Stay: Payer: Medicare HMO | Attending: Oncology | Admitting: Oncology

## 2018-03-13 VITALS — BP 141/68 | HR 53 | Temp 97.8°F | Resp 17 | Ht 69.0 in | Wt 160.0 lb

## 2018-03-13 DIAGNOSIS — Z85038 Personal history of other malignant neoplasm of large intestine: Secondary | ICD-10-CM | POA: Diagnosis not present

## 2018-03-13 DIAGNOSIS — Z9221 Personal history of antineoplastic chemotherapy: Secondary | ICD-10-CM

## 2018-03-13 DIAGNOSIS — R918 Other nonspecific abnormal finding of lung field: Secondary | ICD-10-CM | POA: Insufficient documentation

## 2018-03-13 DIAGNOSIS — I2510 Atherosclerotic heart disease of native coronary artery without angina pectoris: Secondary | ICD-10-CM | POA: Insufficient documentation

## 2018-03-13 DIAGNOSIS — C187 Malignant neoplasm of sigmoid colon: Secondary | ICD-10-CM

## 2018-03-13 NOTE — Progress Notes (Signed)
  Keith [name]   Diagnosis: Colon cancer  INTERVAL HISTORY:   Keith [name] returns as scheduled.  He feels well.  Good appetite and energy level.  He continues to have multiple bowel movements per day.  This is been a pattern since the time of surgery.  Objective:  Vital signs in last 24 hours:  Blood pressure (!) 141/68, pulse (!) 53, temperature 97.8 F (36.6 C), temperature source Oral, resp. rate 17, height _0  (1.753 m), weight 160 lb (72.6 kg), SpO2 97 %.    HEENT: Neck without mass Lymphatics: No cervical, supraclavicular, axillary, or inguinal nodes Resp: Lungs clear bilaterally Cardio: Regular rate and rhythm GI: No hepatospleno megaly, no mass, nontender Vascular: No leg edema   Lab Results:    Lab Results  Component Value Date   CEA1 1.70 09/10/2017    Medications: I have reviewed the patient's current medications.   Assessment/Plan: 1. Adenocarcinoma at 20 cm from the anal verge, most consistent with sigmoid colon cancer ? Staging PET scan 07/05/2015 with no evidence of metastatic disease ? Endoscopic ultrasound 06/29/2015 consistent with a T2N0 tumor ? Status post a robotic low anterior resection 08/02/2015 with tumor confirmed above the peritoneal reflection, pT2 vs pT3,N1b, focally positive circumferential margin, no loss of mismatch repair protein expression ? Cycle 1 adjuvant Xeloda 09/02/2015 ? Cycle 2 adjuvant Xeloda 09/23/2015 ? Cycle 3 adjuvant Xeloda 10/14/2015; dose reduced to 1000 mg in the morning and 500 mg in the evening for 14 days followed by 7 days off (skin rash, hand-foot syndrome) ? Cycle 4 adjuvant Xeloda 11/04/2015 ? Cycle 5 adjuvant Xeloda 11/25/2015 ? Cycle 6 adjuvant Xeloda 12/16/2015 ? Cycle 7 adjuvant Xeloda 01/06/2016 ? Cycle 8 adjuvant Xeloda 01/27/2016 ? Colonoscopy 08/20/2016-few diverticula in the rectosigmoid colon. Repeat colonoscopy in 3 years for surveillance. ? CTs chest, abdomen,  and pelvis 09/12/2016-negative for metastatic disease, stable right upper lobe nodule ? CTs 09/11/2017- resolution of a previously noted right upper lobe nodule, no evidence of metastatic disease  2. History of coronary artery disease  3. History ofMild hyperbilirubinemia -chronic, [place]?  Disposition: Keith [name] remains in clinical remission from colon cancer.  We will follow-up on the CEA from today.  We discussed the indication for another surveillance CT.  Keith [name] is in excellent health and would consider undergoing surgical resection if he had resectable metastatic disease.  He would like to proceed with the 3-year surveillance CT scan.  This will be scheduled for December.  Keith [name] will return for an office visit after the CT in December.  15 minutes were spent with the patient today.  The majority of the time was used for counseling and coordination of care.  [name], MD  03/13/2018  3:41 PM

## 2018-03-13 NOTE — Telephone Encounter (Signed)
Scheduled appt per 6/6 los -Gave patient aVS and calender per los. Central radiology to contact patient with ct schedule.

## 2018-03-14 ENCOUNTER — Telehealth: Payer: Self-pay | Admitting: Emergency Medicine

## 2018-03-14 LAB — CEA (IN HOUSE-CHCC): CEA (CHCC-In House): 1.06 ng/mL (ref 0.00–5.00)

## 2018-03-14 NOTE — Telephone Encounter (Addendum)
VM left for pt to call back regarding this note   ----- Message from Ladell Pier, MD sent at 03/14/2018 11:58 AM EDT ----- Please call patient, CEA is normal

## 2018-03-14 NOTE — Telephone Encounter (Signed)
Pt verbalized understanding of normal cea result.

## 2018-05-09 NOTE — Progress Notes (Signed)
MEDICARE ANNUAL WELLNESS VISIT AND FOLLOW UP Assessment:   Encounter for Medicare annual wellness exam  Essential hypertension - CBC with Differential - CMP/GFR - TSH  Encounter for long-term (current) use of other medications - Magnesium  Hyperlipidemia -continue medications, check lipids, decrease fatty foods, increase activity. - Lipid panel  PreDiabetes Discussed general issues about diabetes pathophysiology and management., Educational material distributed., Suggested low cholesterol diet., Encouraged aerobic exercise., Discussed foot care., Reminded to get yearly retinal exam. - Hemoglobin A1c  Vitamin D deficiency At goal at recent check; continue to recommend supplementation for goal of 70-100 Defer vitamin D level  Gastroesophageal reflux disease without esophagitis Diet discussed  Cancer of sigmoid colon ([facility]) Continue follow up Dr. Amedeo Plenty and Dr. Ammie Dalton  Elevated PSA Checked at CPE  Allergic rhinitis - Allegra OTC, increase H20, allergy hygiene explained. -     azelastine (ASTELIN) 0.1 % nasal spray; Place 2 sprays into both nostrils 2 (two) times daily. Use in each nostril as directed  Future Appointments  Date Time Provider [place]  08/13/2018 10:00 AM Unk Pinto, MD [facility] None  09/12/2018  9:30 AM [facility] None  09/15/2018  9:00 AM Ladell Pier, MD [facility] None      Plan:   During the course of the visit the patient was educated and counseled about appropriate screening and preventive services including:    Pneumococcal vaccine   Influenza vaccine  Td vaccine  Screening electrocardiogram  Colorectal cancer screening  Diabetes screening  Glaucoma screening  Nutrition counseling   Conditions/risks identified: BMI: Discussed weight loss, diet, and increase physical activity.  Increase physical activity: AHA recommends 150 minutes of physical activity a week.  Medications reviewed Diabetes  is at goal, ACE/ARB therapy: Yes. Urinary Incontinence is not an issue: discussed non pharmacology and pharmacology options.  Fall risk: low- discussed PT, home fall assessment, medications.    Subjective:  Keith [name] is a 84 y.o. male who presents for Medicare Annual Wellness Visit and 3 month follow up for HTN, hyperlipidemia, prediabetes, and vitamin D Def. Patient was diagnosed with colon cancer in Oct 2016, a/p resection 08/02/2015, completed final cycle of chemotherapy with Dr. Benay Spice on 02/09/2016, will follow CEAs and follow with Dr. Amedeo Plenty.   BMI is Body mass index is 23.63 kg/m., he has been working on diet and exercise. Wt Readings from Last 3 Encounters:  05/12/18 160 lb (72.6 kg)  03/13/18 160 lb (72.6 kg)  01/23/18 162 lb 6.4 oz (73.7 kg)   His blood pressure has been controlled at home, today their BP: (!) 120/56  He does workout, walks and goes to the [facility] . He had a normal stress test 2008, EF 86%, had PTCA in 1991.  He denies chest pain, shortness of breath, dizziness.   He is on cholesterol medication (atorvastatin 40 mg daily) and denies myalgias. His cholesterol is at goal. The cholesterol last visit was:   Lab Results  Component Value Date   CHOL 128 01/23/2018   HDL 46 01/23/2018   LDLCALC 63 01/23/2018   TRIG 109 01/23/2018   CHOLHDL 2.8 01/23/2018   He has been working on diet and exercise for prediabetes, and denies paresthesia of the feet, polydipsia and polyuria. Last A1C in the office was:  Lab Results  Component Value Date   HGBA1C 5.8 (H) 01/23/2018   Patient is on Vitamin D supplement.   Lab Results  Component Value Date   VD25OH 71 01/23/2018  He has hx of elevated PSAs checked annually at CPE:  Lab Results  Component Value Date   PSA 2.6 07/12/2017   PSA 2.7 06/12/2016   PSA 3.74 05/06/2015     Medication Review: Current Outpatient Medications on File Prior to Visit  Medication Sig Dispense Refill  . aspirin 81 MG tablet Take  81 mg by mouth every morning.     Marland Kitchen atorvastatin (LIPITOR) 40 MG tablet TAKE 1 TABLET DAILY FOR    CHOLESTEROL 90 tablet 1  . Cholecalciferol (VITAMIN D PO) Take 2,000 Int'l Units by mouth every morning.     . loperamide (IMODIUM) 2 MG capsule Take 2 mg by mouth as needed for diarrhea or loose stools.    . Magnesium 250 MG TABS Take 1 tablet by mouth 2 (two) times daily.     . Omega-3 Fatty Acids (FISH OIL PO) Take 1 tablet by mouth every morning.     . ranitidine (ZANTAC) 150 MG tablet TAKE 1 TABLET DAILY FOR    INDIGESTION AND ACID REFLUX 90 tablet 3  . triamcinolone ointment (KENALOG) 0.1 % Apply 1 application topically 2 (two) times daily. 80 g 3   No current facility-administered medications on file prior to visit.     Current Problems (verified) Patient Active Problem List   Diagnosis Date Noted  . Non-seasonal allergic rhinitis 05/12/2018  . Colon cancer ([place]) 07/15/2015  . Medication management 03/02/2014  . Prediabetes 09/14/2013  . Hypertension   . Hyperlipidemia, mixed   . GERD (gastroesophageal reflux disease)   . Vitamin D deficiency   . Elevated PSA     Screening Tests Immunization History  Administered Date(s) Administered  . Influenza Split 07/29/2013, 06/22/2015  . Influenza, High Dose Seasonal PF 06/15/2014, 06/12/2016  . Influenza-Unspecified 06/04/2017  . Pneumococcal Conjugate-13 06/15/2014  . Pneumococcal Polysaccharide-23 03/12/2016  . Td 02/09/2010  . Zoster 10/08/2000   Preventative care: Last colonoscopy: 08/20/2016, repeat 3 years PET image 06/2015  Prior vaccinations: TD or Tdap: 2011  Influenza: 2018 Pneumococcal: 2004, 2017 Prevnar 13: 2015 Shingles/Zostavax: 2002   Names of Other Physician/Practitioners you currently use: 1. [facility] here for primary care 1) Dr. Trecia Rogers, eye doctor, 2019 2) Dr. Cheron Schaumann, last visit 2019 - goes q81m  Patient Care Team: Unk Pinto, MD as PCP - General  (Internal Medicine) Dyke Maes, [place] (Optometry) Franchot Gallo, MD as Consulting Physician (Urology) Arsenio Loader, MD as Referring Physician (Orthopedic Surgery) Teena Irani, MD (Inactive) as Consulting Physician (Gastroenterology) Tania Ade, RN as Registered Nurse (Medical Oncology) Ladell Pier, MD as Consulting Physician (Oncology)   History reviewed: allergies, current medications, past family history, past medical history, past social history, past surgical history and problem list  Family History  Problem Relation Age of Onset  . Heart disease Mother   . Heart disease Father    Past Surgical History:  Procedure Laterality Date  . EUS N/A 06/29/2015   Procedure: LOWER ENDOSCOPIC ULTRASOUND (EUS);  Surgeon: Arta Silence, MD;  Location: [facility];  Service: Endoscopy;  Laterality: N/A;  . HERNIA REPAIR     left inguinal  . XI ROBOTIC ASSISTED LOWER ANTERIOR RESECTION N/A 08/02/2015   Procedure: XI ROBOTIC ASSISTED LOWER ANTERIOR RESECTION;  Surgeon: Stark Klein, MD;  Location: [facility];  Service: General;  Laterality: N/A;   Risk Factors: Tobacco Social History   Tobacco Use  . Smoking status: Former Smoker    Last attempt to quit: 10/04/1965  Years since quitting: 52.6  . Smokeless tobacco: Never Used  Substance Use Topics  . Alcohol use: Yes    Alcohol/week: 0.0 oz    Comment: rarely  . Drug use: No   He does not smoke.  Patient is not a former smoker. Are there smokers in your home (other than you)?  No  Alcohol Current alcohol use: social drinker  Caffeine Current caffeine use: coffee 1-2 /day  Exercise Current exercise: walking  Nutrition/Diet Current diet: in general, a "healthy" diet    Cardiac risk factors: advanced age (older than 66 for men, 93 for women), hypertension and male gender.  Depression Screen (Note: if answer to either of the following is "Yes", a more complete depression screening is indicated)   Q1: Over  the past two weeks, have you felt down, depressed or hopeless? No  Q2: Over the past two weeks, have you felt little interest or pleasure in doing things? No  Have you lost interest or pleasure in daily life? No  Do you often feel hopeless? No  Do you cry easily over simple problems? No  Activities of Daily Living In your present state of health, do you have any difficulty performing the following activities?:  Driving? No Managing money?  No Feeding yourself? No Getting from bed to chair? No Climbing a flight of stairs? No Preparing food and eating?: No Bathing or showering? No Getting dressed: No Getting to the toilet? No Using the toilet:No Moving around from place to place: No In the past year have you fallen or had a near fall?:No   Vision Difficulties: No  Hearing Difficulties: No Do you often ask people to speak up or repeat themselves? No Do you experience ringing or noises in your ears? No Do you have difficulty understanding soft or whispered voices? No  Cognition  Do you feel that you have a problem with memory?No  Do you often misplace items? No  Do you feel safe at home?  Yes  Advanced directives Does patient have a [place]? Yes Does patient have a Living Will? Yes   Objective:   Blood pressure (!) 120/56, pulse (!) 57, temperature (!) 97.3 F (36.3 C), height 5\' 9"  (1.753 m), weight 160 lb (72.6 kg), SpO2 96 %. Body mass index is 23.63 kg/m.  General appearance: alert, no distress, WD/WN, male Cognitive Testing  Alert? Yes  Normal Appearance?Yes  Oriented to person? Yes  Place? Yes   Time? Yes  Recall of three objects?  Yes  Can perform simple calculations? Yes  Displays appropriate judgment?Yes  Can read the correct time from a watch face?Yes  HEENT: normocephalic, sclerae anicteric, TMs pearly, nares patent, no discharge or erythema, pharynx normal Oral cavity: MMM, no lesions Neck: supple, no lymphadenopathy, no  thyromegaly, no masses Heart: RRR, normal S1, S2, no murmurs Lungs: CTA bilaterally, no wheezes, rhonchi, or rales Abdomen: +bs, soft, non tender, non distended, no masses, no hepatomegaly, no splenomegaly Musculoskeletal: nontender, no swelling, no obvious deformity Extremities: no edema, no cyanosis, no clubbing Pulses: 2+ symmetric, upper and lower extremities, normal cap refill Neurological: alert, oriented x 3, CN2-12 intact, strength normal upper extremities and lower extremities, sensation normal throughout, DTRs 2+ throughout, no cerebellar signs, gait normal Psychiatric: normal affect, behavior normal, pleasant   Medicare Attestation I have personally reviewed: The patient's medical and social history Their use of alcohol, tobacco or illicit drugs Their current medications and supplements The patient's functional ability including ADLs,fall risks, home  safety risks, cognitive, and hearing and visual impairment Diet and physical activities Evidence for depression or mood disorders  The patient's weight, height, BMI, and visual acuity have been recorded in the chart.  I have made referrals, counseling, and provided education to the patient based on review of the above and I have provided the patient with a written personalized care plan for preventive services.     Izora Ribas, NP   05/12/2018

## 2018-05-12 ENCOUNTER — Ambulatory Visit (INDEPENDENT_AMBULATORY_CARE_PROVIDER_SITE_OTHER): Payer: Medicare HMO | Admitting: Adult Health

## 2018-05-12 ENCOUNTER — Encounter: Payer: Self-pay | Admitting: Adult Health

## 2018-05-12 VITALS — BP 120/56 | HR 57 | Temp 97.3°F | Ht 69.0 in | Wt 160.0 lb

## 2018-05-12 DIAGNOSIS — Z79899 Other long term (current) drug therapy: Secondary | ICD-10-CM | POA: Diagnosis not present

## 2018-05-12 DIAGNOSIS — Z0001 Encounter for general adult medical examination with abnormal findings: Secondary | ICD-10-CM

## 2018-05-12 DIAGNOSIS — E559 Vitamin D deficiency, unspecified: Secondary | ICD-10-CM

## 2018-05-12 DIAGNOSIS — K219 Gastro-esophageal reflux disease without esophagitis: Secondary | ICD-10-CM | POA: Diagnosis not present

## 2018-05-12 DIAGNOSIS — R972 Elevated prostate specific antigen [PSA]: Secondary | ICD-10-CM | POA: Diagnosis not present

## 2018-05-12 DIAGNOSIS — Z6823 Body mass index (BMI) 23.0-23.9, adult: Secondary | ICD-10-CM | POA: Diagnosis not present

## 2018-05-12 DIAGNOSIS — C187 Malignant neoplasm of sigmoid colon: Secondary | ICD-10-CM

## 2018-05-12 DIAGNOSIS — I10 Essential (primary) hypertension: Secondary | ICD-10-CM

## 2018-05-12 DIAGNOSIS — R6889 Other general symptoms and signs: Secondary | ICD-10-CM | POA: Diagnosis not present

## 2018-05-12 DIAGNOSIS — J3089 Other allergic rhinitis: Secondary | ICD-10-CM

## 2018-05-12 DIAGNOSIS — Z0000 Encounter for general adult medical examination without abnormal findings: Secondary | ICD-10-CM

## 2018-05-12 DIAGNOSIS — E782 Mixed hyperlipidemia: Secondary | ICD-10-CM

## 2018-05-12 DIAGNOSIS — R7303 Prediabetes: Secondary | ICD-10-CM

## 2018-05-12 MED ORDER — AZELASTINE HCL 0.1 % NA SOLN: 2.0000 | Freq: Two times a day (BID) | NASAL | 2 refills | Status: DC

## 2018-05-12 NOTE — Patient Instructions (Signed)
Aim for 7+ servings of fruits and vegetables daily  65-80+ fluid ounces of water or unsweet tea for healthy kidneys  Limit animal fats in diet for cholesterol and heart health - choose grass fed whenever available  Avoid highly processed foods, and foods high in saturated/trans fats  Aim for low stress - take time to unwind and care for your mental health  Aim for 150 min of moderate intensity exercise weekly for heart health, and weights twice weekly for bone health  Aim for 7-9 hours of sleep daily    Allergic Rhinitis, Adult Allergic rhinitis is an allergic reaction that affects the mucous membrane inside the nose. It causes sneezing, a runny or stuffy nose, and the feeling of mucus going down the back of the throat (postnasal drip). Allergic rhinitis can be mild to severe. There are two types of allergic rhinitis:  Seasonal. This type is also called hay fever. It happens only during certain seasons.  Perennial. This type can happen at any time of the year.  What are the causes? This condition happens when the body's defense system (immune system) responds to certain harmless substances called allergens as though they were germs.  Seasonal allergic rhinitis is triggered by pollen, which can come from grasses, trees, and weeds. Perennial allergic rhinitis may be caused by:  House dust mites.  Pet dander.  Mold spores.  What are the signs or symptoms? Symptoms of this condition include:  Sneezing.  Runny or stuffy nose (nasal congestion).  Postnasal drip.  Itchy nose.  Tearing of the eyes.  Trouble sleeping.  Daytime sleepiness.  How is this diagnosed? This condition may be diagnosed based on:  Your medical history.  A physical exam.  Tests to check for related conditions, such as: ? Asthma. ? Pink eye. ? Ear infection. ? Upper respiratory infection.  Tests to find out which allergens trigger your symptoms. These may include skin or blood  tests.  How is this treated? There is no cure for this condition, but treatment can help control symptoms. Treatment may include:  Taking medicines that block allergy symptoms, such as antihistamines. Medicine may be given as a shot, nasal spray, or pill.  Avoiding the allergen.  Desensitization. This treatment involves getting ongoing shots until your body becomes less sensitive to the allergen. This treatment may be done if other treatments do not help.  If taking medicine and avoiding the allergen does not work, new, stronger medicines may be prescribed.  Follow these instructions at home:  Find out what you are allergic to. Common allergens include smoke, dust, and pollen.  Avoid the things you are allergic to. These are some things you can do to help avoid allergens: ? Replace carpet with wood, tile, or vinyl flooring. Carpet can trap dander and dust. ? Do not smoke. Do not allow smoking in your home. ? Change your heating and air conditioning filter at least once a month. ? During allergy season:  Keep windows closed as much as possible.  Plan outdoor activities when pollen counts are lowest. This is usually during the evening hours.  When coming indoors, change clothing and shower before sitting on furniture or bedding.  Take over-the-counter and prescription medicines only as told by your health care provider.  Keep all follow-up visits as told by your health care provider. This is important. Contact a health care provider if:  You have a fever.  You develop a persistent cough.  You make whistling sounds when you breathe (you  wheeze).  Your symptoms interfere with your normal daily activities. Get help right away if:  You have shortness of breath. Summary  This condition can be managed by taking medicines as directed and avoiding allergens.  Contact your health care provider if you develop a persistent cough or fever.  During allergy season, keep windows  closed as much as possible. This information is not intended to replace advice given to you by your health care provider. Make sure you discuss any questions you have with your health care provider. Document Released: 06/19/2001 Document Revised: 11/01/2016 Document Reviewed: 11/01/2016 Elsevier Interactive Patient Education  2018 [employer].    Azelastine nasal spray What is this medicine? AZELASTINE (a ZEL as teen) nasal spray is a histamine blocker. It helps to relieve itching, running and stuffiness in the nose. This medicine is used to treat nasal symptoms from allergies and other irritants. This medicine may be used for other purposes; ask your health care provider or pharmacist if you have questions. COMMON BRAND NAME(S): Astelin, Astepro What should I tell my health care provider before I take this medicine? They need to know if you have any of these conditions: -any other medical problems -an unusual or allergic reaction to azelastine, other nasal sprays, medicines, foods, dyes, or preservatives -pregnant or trying to become pregnant -breast-feeding How should I use this medicine? This medicine is for use only in the nose. Follow the directions on the prescription label. Do not use more often than directed. Make sure that you are using your inhaler correctly. Ask you doctor or health care provider if you have any questions. Talk to your pediatrician regarding the use of this medicine in children. While some products may be prescribed for children as young as 6 months for selected conditions, precautions do apply. Overdosage: If you think you have taken too much of this medicine contact a poison control center or emergency room at once. NOTE: This medicine is only for you. Do not share this medicine with others. What if I miss a dose? If you miss a dose, use it as soon as you can. If it is almost time for your next dose, use only that dose. Do not use double or extra doses. What  may interact with this medicine? -cimetidine -other antihistamines This list may not describe all possible interactions. Give your health care provider a list of all the medicines, herbs, non-prescription drugs, or dietary supplements you use. Also tell them if you smoke, drink alcohol, or use illegal drugs. Some items may interact with your medicine. What should I watch for while using this medicine? Tell your doctor or health care professional if your symptoms do not improve. Do not use extra medicine. Do not spray in your eyes. This medicine may make you feel confused, dizzy or lightheaded. Drinking alcohol or taking medicine that causes drowsiness can make this worse. Do not drive, use machinery, or do anything that needs mental alertness until you know how this medicine affects you. What side effects may I notice from receiving this medicine? Side effects that you should report to your doctor or health care professional as soon as possible: -allergic reactions like skin rash, itching or hives, swelling of the face, lips, or tongue -breathing problems -fast heartbeat -high blood pressure -infection Side effects that usually do not require medical attention (report to your doctor or health care professional if they continue or are bothersome): -bitter taste -cough -feeling tired -headache -larger appetite or weight gain -nose or throat  irritation -nosebleed -sneezing This list may not describe all possible side effects. Call your doctor for medical advice about side effects. You may report side effects to FDA at 1-800-FDA-1088. Where should I keep my medicine? Keep out of the reach of children. Store upright and tightly closed at room temperature between 20 and 25 degrees C (68 and 77 degrees F). Do not freeze. Throw away any unused medicine after the expiration date or after 200 sprays, whichever comes first. NOTE: This sheet is a summary. It may not cover all possible information. If  you have questions about this medicine, talk to your doctor, pharmacist, or health care provider.  2018 Elsevier/Gold Standard (2013-12-01 15:52:44)

## 2018-05-13 LAB — COMPLETE METABOLIC PANEL WITH GFR
AG RATIO: 1.6 (calc) (ref 1.0–2.5)
ALBUMIN MSPROF: 4.2 g/dL (ref 3.6–5.1)
ALT: 23 U/L (ref 9–46)
AST: 24 U/L (ref 10–35)
Alkaline phosphatase (APISO): 68 U/L (ref 40–115)
BILIRUBIN TOTAL: 1.2 mg/dL (ref 0.2–1.2)
BUN: 16 mg/dL (ref 7–25)
CHLORIDE: 104 mmol/L (ref 98–110)
CO2: 32 mmol/L (ref 20–32)
Calcium: 9.4 mg/dL (ref 8.6–10.3)
Creat: 0.82 mg/dL (ref 0.70–1.11)
GFR, EST AFRICAN AMERICAN: 94 mL/min/{1.73_m2} (ref >=60)
GFR, Est Non African American: 81 mL/min/{1.73_m2} (ref >=60)
GLOBULIN: 2.6 g/dL (ref 1.9–3.7)
Glucose, Bld: 100 mg/dL — ABNORMAL HIGH (ref 65–99)
POTASSIUM: 5.1 mmol/L (ref 3.5–5.3)
SODIUM: 140 mmol/L (ref 135–146)
TOTAL PROTEIN: 6.8 g/dL (ref 6.1–8.1)

## 2018-05-13 LAB — CBC WITH DIFFERENTIAL/PLATELET
BASOS ABS: 31 {cells}/uL (ref 0–200)
BASOS PCT: 0.6 %
EOS ABS: 240 {cells}/uL (ref 15–500)
Eosinophils Relative: 4.7 %
HCT: 46.1 % (ref 38.5–50.0)
HEMOGLOBIN: 15.1 g/dL (ref 13.2–17.1)
Lymphs Abs: 1663 cells/uL (ref 850–3900)
MCH: 29.7 pg (ref 27.0–33.0)
MCHC: 32.8 g/dL (ref 32.0–36.0)
MCV: 90.6 fL (ref 80.0–100.0)
MPV: 10.8 fL (ref 7.5–12.5)
Monocytes Relative: 9.4 %
NEUTROS ABS: 2688 {cells}/uL (ref 1500–7800)
Neutrophils Relative %: 52.7 %
Platelets: 171 10*3/uL (ref 140–400)
RBC: 5.09 10*6/uL (ref 4.20–5.80)
RDW: 12.3 % (ref 11.0–15.0)
Total Lymphocyte: 32.6 %
WBC: 5.1 10*3/uL (ref 3.8–10.8)
WBCMIX: 479 {cells}/uL (ref 200–950)

## 2018-05-13 LAB — LIPID PANEL
Cholesterol: 145 mg/dL (ref <200)
HDL: 41 mg/dL (ref >40)
LDL CHOLESTEROL (CALC): 79 mg/dL
Non-HDL Cholesterol (Calc): 104 mg/dL (calc) (ref <130)
Total CHOL/HDL Ratio: 3.5 (calc) (ref <5.0)
Triglycerides: 148 mg/dL (ref <150)

## 2018-05-13 LAB — HEMOGLOBIN A1C
HEMOGLOBIN A1C: 5.6 %{Hb} (ref <5.7)
Mean Plasma Glucose: 114 (calc)
eAG (mmol/L): 6.3 (calc)

## 2018-05-13 LAB — TSH: TSH: 1.92 m[IU]/L (ref 0.40–4.50)

## 2018-06-10 DIAGNOSIS — R69 Illness, unspecified: Secondary | ICD-10-CM | POA: Diagnosis not present

## 2018-07-09 DIAGNOSIS — Z9889 Other specified postprocedural states: Secondary | ICD-10-CM | POA: Diagnosis not present

## 2018-07-09 DIAGNOSIS — H524 Presbyopia: Secondary | ICD-10-CM | POA: Diagnosis not present

## 2018-07-09 DIAGNOSIS — R69 Illness, unspecified: Secondary | ICD-10-CM | POA: Diagnosis not present

## 2018-07-25 ENCOUNTER — Other Ambulatory Visit: Payer: Self-pay | Admitting: Internal Medicine

## 2018-07-25 DIAGNOSIS — E782 Mixed hyperlipidemia: Secondary | ICD-10-CM

## 2018-08-13 ENCOUNTER — Ambulatory Visit (INDEPENDENT_AMBULATORY_CARE_PROVIDER_SITE_OTHER): Payer: Medicare HMO | Admitting: Internal Medicine

## 2018-08-13 ENCOUNTER — Encounter: Payer: Self-pay | Admitting: Internal Medicine

## 2018-08-13 VITALS — BP 138/84 | HR 56 | Temp 97.2°F | Resp 16 | Ht 70.0 in | Wt 162.4 lb

## 2018-08-13 DIAGNOSIS — E559 Vitamin D deficiency, unspecified: Secondary | ICD-10-CM | POA: Diagnosis not present

## 2018-08-13 DIAGNOSIS — Z79899 Other long term (current) drug therapy: Secondary | ICD-10-CM | POA: Diagnosis not present

## 2018-08-13 DIAGNOSIS — Z0000 Encounter for general adult medical examination without abnormal findings: Secondary | ICD-10-CM | POA: Diagnosis not present

## 2018-08-13 DIAGNOSIS — Z125 Encounter for screening for malignant neoplasm of prostate: Secondary | ICD-10-CM

## 2018-08-13 DIAGNOSIS — R7309 Other abnormal glucose: Secondary | ICD-10-CM

## 2018-08-13 DIAGNOSIS — K219 Gastro-esophageal reflux disease without esophagitis: Secondary | ICD-10-CM | POA: Diagnosis not present

## 2018-08-13 DIAGNOSIS — N401 Enlarged prostate with lower urinary tract symptoms: Secondary | ICD-10-CM | POA: Diagnosis not present

## 2018-08-13 DIAGNOSIS — Z136 Encounter for screening for cardiovascular disorders: Secondary | ICD-10-CM

## 2018-08-13 DIAGNOSIS — I2510 Atherosclerotic heart disease of native coronary artery without angina pectoris: Secondary | ICD-10-CM | POA: Insufficient documentation

## 2018-08-13 DIAGNOSIS — E782 Mixed hyperlipidemia: Secondary | ICD-10-CM

## 2018-08-13 DIAGNOSIS — Z8249 Family history of ischemic heart disease and other diseases of the circulatory system: Secondary | ICD-10-CM

## 2018-08-13 DIAGNOSIS — N138 Other obstructive and reflux uropathy: Secondary | ICD-10-CM

## 2018-08-13 DIAGNOSIS — R7303 Prediabetes: Secondary | ICD-10-CM

## 2018-08-13 DIAGNOSIS — Z0001 Encounter for general adult medical examination with abnormal findings: Secondary | ICD-10-CM

## 2018-08-13 DIAGNOSIS — Z87891 Personal history of nicotine dependence: Secondary | ICD-10-CM

## 2018-08-13 DIAGNOSIS — Z85048 Personal history of other malignant neoplasm of rectum, rectosigmoid junction, and anus: Secondary | ICD-10-CM | POA: Insufficient documentation

## 2018-08-13 DIAGNOSIS — Z1212 Encounter for screening for malignant neoplasm of rectum: Secondary | ICD-10-CM

## 2018-08-13 DIAGNOSIS — N182 Chronic kidney disease, stage 2 (mild): Secondary | ICD-10-CM | POA: Diagnosis not present

## 2018-08-13 DIAGNOSIS — R0989 Other specified symptoms and signs involving the circulatory and respiratory systems: Secondary | ICD-10-CM

## 2018-08-13 DIAGNOSIS — Z1211 Encounter for screening for malignant neoplasm of colon: Secondary | ICD-10-CM

## 2018-08-13 NOTE — Progress Notes (Signed)
[facility]   Unk Pinto, M.D.     [name]. [name], P.A.-C [name], [place]                [place] [address] Telephone [phone] Telefax [phone] Annual  Screening/Preventative Visit  & Comprehensive Evaluation & Examination     This very nice 84 y.o. MWM presents for a Screening / Preventative Visit & comprehensive evaluation and management of multiple medical co-morbidities.  Patient has been followed for HTN, HLD, Prediabetes and Vitamin D Deficiency. Patient has GERD controlled on his Meds.      In Oct 2016, patient underwent Colon Ca Surg  and s/p ChemoTx w/Xeloda - now followed by active surveillance with Abd CT's per  Dr Benay Spice.      Patient has hx/o Labile HTN & in 1981 underwent PCA and has done well since. Negative Cardiolite (EF 86%) in 2008.  Patient's BP has been controlled at home.  Today's BP is at goal - 138/84. Patient denies any cardiac symptoms as chest pain, palpitations, shortness of breath, dizziness or ankle swelling.     Patient's hyperlipidemia is controlled with diet / medications. Patient denies myalgias or other medication SE's. Last lipids were at goal and patient advised OK to stop his Atorvastatin due to age: Lab Results  Component Value Date   CHOL 145 05/12/2018   HDL 41 05/12/2018   LDLCALC 79 05/12/2018   TRIG 148 05/12/2018   CHOLHDL 3.5 05/12/2018      Patient has hx/o prediabetes (A1c 5.9% / 2014) and patient denies reactive hypoglycemic symptoms, visual blurring, diabetic polys or paresthesias. Last A1c was Normal & at goal: Lab Results  Component Value Date   HGBA1C 5.6 05/12/2018       Finally, patient has history of Vitamin D Deficiency ("27" / 2009)  and last vitamin D was at goal: Lab Results  Component Value Date   VD25OH 71 01/23/2018   Current Outpatient Medications on File Prior to Visit   Medication Sig  . aspirin 81 MG tablet Take 81 mg by mouth every morning.   Marland Kitchen atorvastatin (LIPITOR) 40 MG tablet TAKE 1 TABLET DAILY FOR    CHOLESTEROL  . Cholecalciferol (VITAMIN D PO) Take 2,000 Int'l Units by mouth every morning.   . loperamide (IMODIUM) 2 MG capsule Take 2 mg by mouth as needed for diarrhea or loose stools.  . Magnesium 250 MG TABS Take 1 tablet by mouth 2 (two) times daily.   . Omega-3 Fatty Acids (FISH OIL PO) Take 1 tablet by mouth every morning.   . ranitidine (ZANTAC) 150 MG tablet TAKE 1 TABLET DAILY FOR    INDIGESTION AND ACID REFLUX  . triamcinolone ointment (KENALOG) 0.1 % Apply 1 application topically 2 (two) times daily.   No current facility-administered medications on file prior to visit.    Allergies  Allergen Reactions  . Penicillins    Past Medical History:  Diagnosis Date  . Cancer ([place])    cancer of recto-sigmoid junction-dx 07/2015  . Elevated PSA   . GERD (gastroesophageal reflux disease)   . Hemorrhoids   . Hyperlipidemia   . Hypertension   . Pneumonia    age 73  . Vitamin D deficiency    Health Maintenance  Topic Date Due  . TETANUS/TDAP  02/10/2020  . INFLUENZA VACCINE  Completed  . PNA vac Low Risk Adult  Completed   Immunization History  Administered Date(s) Administered  . Influenza Split 07/29/2013, 06/22/2015  . Influenza, High Dose Seasonal PF 06/15/2014, 06/12/2016  . Influenza-Unspecified 06/04/2017, 06/10/2018  . Pneumococcal Conjugate-13 06/15/2014  . Pneumococcal Polysaccharide-23 03/12/2016  . Td 02/09/2010  . Zoster 10/08/2000   Last Colon -    04/19/2016 - Dr Amedeo Plenty - Colon s/p partial colectomy recc 3 yr f/u  Due July 2020   06/29/2015 Flex Sig (+) Rectal Ca after (+) Cologard   08/02/2015 - Rob. Partial colectomy - Dr Barry Dienes  Past Surgical History:  Procedure Laterality Date  . EUS N/A 06/29/2015   Procedure: LOWER ENDOSCOPIC ULTRASOUND (EUS);  Surgeon: Arta Silence, MD;  Location: [facility];  Service:  Endoscopy;  Laterality: N/A;  . HERNIA REPAIR     left inguinal  . XI ROBOTIC ASSISTED LOWER ANTERIOR RESECTION N/A 08/02/2015   Procedure: XI ROBOTIC ASSISTED LOWER ANTERIOR RESECTION;  Surgeon: Stark Klein, MD;  Location: [facility];  Service: General;  Laterality: N/A;   Family History  Problem Relation Age of Onset  . Heart disease Mother   . Heart disease Father    Social History   Socioeconomic History  . Marital status: Married    Spouse name: Not on file  . Number of children: Not on file  Occupational History  . Retired  Tobacco Use  . Smoking status: Former Smoker    Last attempt to quit: 10/04/1965    Years since quitting: 52.8  . Smokeless tobacco: Never Used  Substance and Sexual Activity  . Alcohol use: Yes    Alcohol/week: 0.0 standard drinks    Comment: rarely  . Drug use: No  . Sexual activity: Not on file  Relationships  Social History Narrative   Married, wife Pamala Hurry   Retired    ROS Constitutional: Denies fever, chills, weight loss/gain, headaches, insomnia,  night sweats or change in appetite. Does c/o fatigue. Eyes: Denies redness, blurred vision, diplopia, discharge, itchy or watery eyes.  ENT: Denies discharge, congestion, post nasal drip, epistaxis, sore throat, earache, hearing loss, dental pain, Tinnitus, Vertigo, Sinus pain or snoring.  Cardio: Denies chest pain, palpitations, irregular heartbeat, syncope, dyspnea, diaphoresis, orthopnea, PND, claudication or edema Respiratory: denies cough, dyspnea, DOE, pleurisy, hoarseness, laryngitis or wheezing.  Gastrointestinal: Denies dysphagia, heartburn, reflux, water brash, pain, cramps, nausea, vomiting, bloating, diarrhea, constipation, hematemesis, melena, hematochezia, jaundice or hemorrhoids Genitourinary: Denies dysuria, frequency,  discharge, hematuria or flank pain. Has urgency, nocturia x 2-3 & occasional hesitancy. Musculoskeletal: Denies arthralgia, myalgia, stiffness, Jt. Swelling, pain, limp  or strain/sprain. Denies Falls. Skin: Denies puritis, rash, hives, warts, acne, eczema or change in skin lesion Neuro: No weakness, tremor, incoordination, spasms, paresthesia or pain Psychiatric: Denies confusion, memory loss or sensory loss. Denies Depression. Endocrine: Denies change in weight, skin, hair change, nocturia, and paresthesia, diabetic polys, visual blurring or hyper / hypo glycemic episodes.  Heme/Lymph: No excessive bleeding, bruising or enlarged lymph nodes.  Physical Exam  BP 138/84   Pulse (!) 56   Temp (!) 97.2 F (36.2 C)   Resp 16   Ht 5\' 10"  (1.778 m)   Wt 162 lb 6.4 oz (73.7 kg)   BMI 23.30 kg/m   General Appearance: Well nourished and well groomed and in no apparent distress.  Eyes: PERRLA, EOMs, conjunctiva no swelling or erythema, normal fundi and vessels. Sinuses: No frontal/maxillary tenderness ENT/Mouth: EACs patent / TMs  nl. Nares clear without erythema,  swelling, mucoid exudates. Oral hygiene is good. No erythema, swelling, or exudate. Tongue normal, non-obstructing. Tonsils not swollen or erythematous. Hearing normal.  Neck: Supple, thyroid not palpable. No bruits, nodes or JVD. Respiratory: Respiratory effort normal.  BS equal and clear bilateral without rales, rhonci, wheezing or stridor. Cardio: Heart sounds are normal with regular rate and rhythm and no murmurs, rubs or gallops. Peripheral pulses are normal and equal bilaterally without edema. No aortic or femoral bruits. Chest: symmetric with normal excursions and percussion.  Abdomen: Soft, with Nl bowel sounds. Nontender, no guarding, rebound, hernias, masses, or organomegaly.  Lymphatics: Non tender without lymphadenopathy.  Genitourinary:  DRE - deferred due to age. Musculoskeletal: Full ROM all peripheral extremities, joint stability, 5/5 strength, and normal gait. Skin: Warm and dry without rashes, lesions, cyanosis, clubbing or  ecchymosis.  Neuro: Cranial nerves intact, reflexes equal  bilaterally. Normal muscle tone, no cerebellar symptoms. Sensation intact.  Pysch: Alert and oriented X 3 with normal affect, insight and judgment appropriate.   Assessment and Plan  1. Annual Preventative/Screening Exam   2. Labile hypertension  - EKG 12-Lead - [place], RETROPERITNL ABD,  LTD - Urinalysis, Routine w reflex microscopic - Microalbumin / creatinine urine ratio - CBC with Differential/Platelet - COMPLETE METABOLIC PANEL WITH GFR - Magnesium  3. Hyperlipidemia, mixed  - EKG 12-Lead - [place], RETROPERITNL ABD,  LTD - TSH  4. Abnormal glucose  - EKG 12-Lead - [place], RETROPERITNL ABD,  LTD - Hemoglobin A1c - Insulin, random  5. Vitamin D deficiency  - VITAMIN D 25 Hydroxyl  6. Gastroesophageal reflux disease  - CBC with Differential/Platelet  7. ASHD (arteriosclerotic heart disease)  - EKG 12-Lead  8. Screening for colorectal cancer  - POC Hemoccult Bld/Stl  9. History of rectal cancer   10. Chronic kidney disease (CKD), stage II (mild)  - Urinalysis, Routine w reflex microscopic - Microalbumin / creatinine urine ratio  11. BPH with obstruction/lower urinary tract symptoms  - PSA  12. Prostate cancer screening  - PSA  13. Screening for ischemic heart disease  - EKG 12-Lead  14. FHx: heart disease  - EKG 12-Lead - [place], RETROPERITNL ABD,  LTD  15. Former smoker  - EKG 12-Lead - [place], RETROPERITNL ABD,  LTD  16. Screening for AAA (aortic abdominal aneurysm)  - [place], RETROPERITNL ABD,  LTD  17. Medication management  - Urinalysis, Routine w reflex microscopic - Microalbumin / creatinine urine ratio - CBC with Differential/Platelet - COMPLETE METABOLIC PANEL WITH GFR - Magnesium - TSH - Hemoglobin A1c - Insulin, random - VITAMIN D 25 Hydroxy       Patient was counseled in prudent diet, weight control to achieve/maintain BMI less than 25, BP monitoring, regular exercise and medications as discussed.  Discussed med effects and SE's. Routine  screening labs and tests as requested with regular follow-up as recommended. Over 40 minutes of exam, counseling, chart review and high complex critical decision making was performed

## 2018-08-13 NOTE — Patient Instructions (Signed)

## 2018-08-14 LAB — URINALYSIS, ROUTINE W REFLEX MICROSCOPIC
Bilirubin Urine: NEGATIVE
Glucose, UA: NEGATIVE
HGB URINE DIPSTICK: NEGATIVE
Ketones, ur: NEGATIVE
LEUKOCYTES UA: NEGATIVE
NITRITE: NEGATIVE
PH: 7 (ref 5.0–8.0)
PROTEIN: NEGATIVE
Specific Gravity, Urine: 1.008 (ref 1.001–1.03)

## 2018-08-14 LAB — COMPLETE METABOLIC PANEL WITH GFR
AG RATIO: 1.7 (calc) (ref 1.0–2.5)
ALBUMIN MSPROF: 3.8 g/dL (ref 3.6–5.1)
ALKALINE PHOSPHATASE (APISO): 53 U/L (ref 40–115)
ALT: 16 U/L (ref 9–46)
AST: 22 U/L (ref 10–35)
BILIRUBIN TOTAL: 1.2 mg/dL (ref 0.2–1.2)
BUN: 13 mg/dL (ref 7–25)
CO2: 32 mmol/L (ref 20–32)
Calcium: 9.1 mg/dL (ref 8.6–10.3)
Chloride: 104 mmol/L (ref 98–110)
Creat: 0.75 mg/dL (ref 0.70–1.11)
GFR, Est African American: 98 mL/min/{1.73_m2} (ref >=60)
GFR, Est Non African American: 84 mL/min/{1.73_m2} (ref >=60)
GLOBULIN: 2.2 g/dL (ref 1.9–3.7)
Glucose, Bld: 95 mg/dL (ref 65–99)
POTASSIUM: 4.7 mmol/L (ref 3.5–5.3)
SODIUM: 140 mmol/L (ref 135–146)
Total Protein: 6 g/dL — ABNORMAL LOW (ref 6.1–8.1)

## 2018-08-14 LAB — CBC WITH DIFFERENTIAL/PLATELET
Basophils Absolute: 42 cells/uL (ref 0–200)
Basophils Relative: 0.8 %
EOS PCT: 4.4 %
Eosinophils Absolute: 229 cells/uL (ref 15–500)
HEMATOCRIT: 43.2 % (ref 38.5–50.0)
HEMOGLOBIN: 14.5 g/dL (ref 13.2–17.1)
LYMPHS ABS: 1524 {cells}/uL (ref 850–3900)
MCH: 30 pg (ref 27.0–33.0)
MCHC: 33.6 g/dL (ref 32.0–36.0)
MCV: 89.3 fL (ref 80.0–100.0)
MPV: 11 fL (ref 7.5–12.5)
Monocytes Relative: 9.6 %
NEUTROS ABS: 2907 {cells}/uL (ref 1500–7800)
NEUTROS PCT: 55.9 %
Platelets: 145 10*3/uL (ref 140–400)
RBC: 4.84 10*6/uL (ref 4.20–5.80)
RDW: 12.5 % (ref 11.0–15.0)
Total Lymphocyte: 29.3 %
WBC: 5.2 10*3/uL (ref 3.8–10.8)
WBCMIX: 499 {cells}/uL (ref 200–950)

## 2018-08-14 LAB — INSULIN, RANDOM: Insulin: 13.9 u[IU]/mL (ref 2.0–19.6)

## 2018-08-14 LAB — TSH: TSH: 1.47 m[IU]/L (ref 0.40–4.50)

## 2018-08-14 LAB — HEMOGLOBIN A1C
Hgb A1c MFr Bld: 5.8 % of total Hgb — ABNORMAL HIGH (ref <5.7)
Mean Plasma Glucose: 120 (calc)
eAG (mmol/L): 6.6 (calc)

## 2018-08-14 LAB — VITAMIN D 25 HYDROXY (VIT D DEFICIENCY, FRACTURES): Vit D, 25-Hydroxy: 70 ng/mL (ref 30–100)

## 2018-08-14 LAB — MAGNESIUM: MAGNESIUM: 1.8 mg/dL (ref 1.5–2.5)

## 2018-08-14 LAB — MICROALBUMIN / CREATININE URINE RATIO
CREATININE, URINE: 33 mg/dL (ref 20–320)
MICROALB UR: 0.2 mg/dL
MICROALB/CREAT RATIO: 6 ug/mg{creat} (ref <30)

## 2018-08-14 LAB — PSA: PSA: 2.9 ng/mL (ref <=4.0)

## 2018-09-12 ENCOUNTER — Ambulatory Visit (HOSPITAL_COMMUNITY)
Admission: RE | Admit: 2018-09-12 | Discharge: 2018-09-12 | Disposition: A | Discharge status: Home / Self Care | Payer: Medicare HMO | Source: Ambulatory Visit | Attending: Oncology | Admitting: Oncology

## 2018-09-12 ENCOUNTER — Inpatient Hospital Stay: Payer: Medicare HMO | Attending: Oncology

## 2018-09-12 DIAGNOSIS — C187 Malignant neoplasm of sigmoid colon: Secondary | ICD-10-CM | POA: Diagnosis not present

## 2018-09-12 DIAGNOSIS — K8020 Calculus of gallbladder without cholecystitis without obstruction: Secondary | ICD-10-CM | POA: Insufficient documentation

## 2018-09-12 DIAGNOSIS — R918 Other nonspecific abnormal finding of lung field: Secondary | ICD-10-CM | POA: Diagnosis not present

## 2018-09-12 DIAGNOSIS — I714 Abdominal aortic aneurysm, without rupture: Secondary | ICD-10-CM | POA: Insufficient documentation

## 2018-09-12 DIAGNOSIS — I2510 Atherosclerotic heart disease of native coronary artery without angina pectoris: Secondary | ICD-10-CM | POA: Diagnosis not present

## 2018-09-12 DIAGNOSIS — C189 Malignant neoplasm of colon, unspecified: Secondary | ICD-10-CM | POA: Diagnosis not present

## 2018-09-12 DIAGNOSIS — N281 Cyst of kidney, acquired: Secondary | ICD-10-CM | POA: Diagnosis not present

## 2018-09-12 DIAGNOSIS — I712 Thoracic aortic aneurysm, without rupture: Secondary | ICD-10-CM | POA: Diagnosis not present

## 2018-09-12 DIAGNOSIS — Z85038 Personal history of other malignant neoplasm of large intestine: Secondary | ICD-10-CM | POA: Diagnosis not present

## 2018-09-12 LAB — BASIC METABOLIC PANEL - CANCER CENTER ONLY
Anion gap: 9 (ref 5–15)
BUN: 13 mg/dL (ref 8–23)
CALCIUM: 9 mg/dL (ref 8.9–10.3)
CHLORIDE: 103 mmol/L (ref 98–111)
CO2: 27 mmol/L (ref 22–32)
CREATININE: 0.82 mg/dL (ref 0.61–1.24)
Glucose, Bld: 89 mg/dL (ref 70–99)
Potassium: 4.4 mmol/L (ref 3.5–5.1)
Sodium: 139 mmol/L (ref 135–145)

## 2018-09-12 LAB — CEA (IN HOUSE-CHCC): CEA (CHCC-IN HOUSE): 1.98 ng/mL (ref 0.00–5.00)

## 2018-09-12 MED ORDER — SODIUM CHLORIDE (PF) 0.9 % IJ SOLN
INTRAMUSCULAR | Status: AC
  Filled 2018-09-12: qty 50

## 2018-09-12 MED ORDER — IOHEXOL 300 MG/ML  SOLN
100.0000 mL | Freq: Once | INTRAMUSCULAR | Status: AC | PRN
  Administered 2018-09-12: 100 mL via INTRAVENOUS

## 2018-09-15 ENCOUNTER — Inpatient Hospital Stay: Payer: Medicare HMO | Admitting: Oncology

## 2018-09-15 VITALS — BP 140/63 | HR 57 | Temp 97.0°F | Resp 18 | Ht 70.0 in | Wt 162.6 lb

## 2018-09-15 DIAGNOSIS — I2510 Atherosclerotic heart disease of native coronary artery without angina pectoris: Secondary | ICD-10-CM

## 2018-09-15 DIAGNOSIS — N281 Cyst of kidney, acquired: Secondary | ICD-10-CM

## 2018-09-15 DIAGNOSIS — C187 Malignant neoplasm of sigmoid colon: Secondary | ICD-10-CM

## 2018-09-15 DIAGNOSIS — I714 Abdominal aortic aneurysm, without rupture: Secondary | ICD-10-CM

## 2018-09-15 DIAGNOSIS — I712 Thoracic aortic aneurysm, without rupture: Secondary | ICD-10-CM | POA: Diagnosis not present

## 2018-09-15 DIAGNOSIS — K8020 Calculus of gallbladder without cholecystitis without obstruction: Secondary | ICD-10-CM

## 2018-09-15 DIAGNOSIS — Z85038 Personal history of other malignant neoplasm of large intestine: Secondary | ICD-10-CM | POA: Diagnosis not present

## 2018-09-15 NOTE — Progress Notes (Signed)
[place] OFFICE PROGRESS NOTE   Diagnosis: Colon cancer  INTERVAL HISTORY:   Keith [name] returns as scheduled.  He feels well.  Good appetite.  No difficulty with bowel function.  Objective:  Vital signs in last 24 hours:  Blood pressure 140/63, pulse (!) 57, temperature (!) 97 F (36.1 C), temperature source Oral, resp. rate 18, height '5\' 10"'  (1.778 m), weight 162 lb 9.6 oz (73.8 kg), SpO2 99 %.    HEENT: Neck without mass Lymphatics: No cervical, supraclavicular, axillary, or inguinal nodes Resp: Lungs clear bilaterally Cardio: Regular  rate and rhythm GI: No paraspinal megaly, no mass, nontender Vascular: No leg edema  Lab Results:  Lab Results  Component Value Date   WBC 5.2 08/13/2018   HGB 14.5 08/13/2018   HCT 43.2 08/13/2018   MCV 89.3 08/13/2018   PLT 145 08/13/2018   NEUTROABS 2,907 08/13/2018    CMP  Lab Results  Component Value Date   NA 139 09/12/2018   K 4.4 09/12/2018   CL 103 09/12/2018   CO2 27 09/12/2018   GLUCOSE 89 09/12/2018   BUN 13 09/12/2018   CREATININE 0.82 09/12/2018   CALCIUM 9.0 09/12/2018   PROT 6.0 (L) 08/13/2018   ALBUMIN 3.9 12/13/2016   AST 22 08/13/2018   ALT 16 08/13/2018   ALKPHOS 57 12/13/2016   BILITOT 1.2 08/13/2018   GFRNONAA >60 09/12/2018   GFRAA >60 09/12/2018    Lab Results  Component Value Date   CEA1 1.98 09/12/2018     Imaging:  Ct Chest W Contrast  Result Date: 09/12/2018 CLINICAL DATA:  Restaging colon cancer. EXAM: CT CHEST, ABDOMEN, AND PELVIS WITH CONTRAST TECHNIQUE: Multidetector CT imaging of the chest, abdomen and pelvis was performed following the standard protocol during bolus administration of intravenous contrast. CONTRAST:  158m OMNIPAQUE IOHEXOL 300 MG/ML  SOLN COMPARISON:  CT scan 09/10/2017 FINDINGS: CT CHEST FINDINGS Cardiovascular: The heart is normal in size. No pericardial effusion. There is mild fusiform dilatation of the ascending aorta with maximum diameter of  4 cm. This is stable. No dissection. The branch vessels are patent. Scattered atherosclerotic calcifications. Stable coronary artery calcifications. Mediastinum/Nodes: No mediastinal or hilar mass or adenopathy. The esophagus is grossly normal. Lungs/Pleura: No acute pulmonary findings or worrisome pulmonary lesions. There are small scattered sub 4 mm pulmonary nodules which are stable and likely benign. There are also scattered calcifications. Patchy tree-in-bud appearance in the right upper lobe is likely chronic inflammation or atypical infection. Musculoskeletal: No chest wall mass, supraclavicular or axillary adenopathy. The bony structures are unremarkable. CT ABDOMEN PELVIS FINDINGS Hepatobiliary: No focal hepatic lesions other than a small stable segment 6 cyst. Small layering gallstones are noted in the gallbladder. No biliary dilatation. Pancreas: No mass, inflammation or ductal dilatation. Spleen: Normal size.  No focal lesions. Adrenals/Urinary Tract: The adrenal glands and kidneys are unremarkable except for stable renal cysts. The bladder is unremarkable. Stomach/Bowel: The stomach, duodenum and small bowel are unremarkable. No acute inflammatory changes, mass lesions or obstructive findings. The terminal ileum is normal. The colon demonstrates a moderate amount of stool throughout it but no mass, inflammation or obstruction. Surgical changes involving the rectum but no findings suspicious for recurrent tumor. Vascular/Lymphatic: Stable atherosclerotic calcifications involving the abdominal aorta. Stable 3.0 x 3.0 cm infrarenal abdominal aortic aneurysm. The major venous structures are patent. No mesenteric or retroperitoneal mass or adenopathy. Small scattered lymph nodes are stable. Reproductive: The prostate gland is mildly enlarged and there is median  lobe hypertrophy impressing on the base of the bladder. The seminal vesicles appear normal. Other: No inguinal mass or adenopathy. No pelvic  adenopathy or free pelvic fluid collections. Musculoskeletal: No significant bony findings. IMPRESSION: 1. Stable surgical changes from rectosigmoid resection. No findings for residual or recurrent tumor or metastatic disease. 2. Stable fusiform aneurysmal dilatation of the ascending aorta with maximum measurement of 4.0 cm. There is also a stable 3.0 cm infrarenal abdominal aortic aneurysm. 3. No findings to suggest pulmonary metastatic disease. Chronic bronchitic and inflammatory changes but no worrisome lesions. Electronically Signed   By: Marijo Sanes M.D.   On: 09/12/2018 16:19   Ct Abdomen Pelvis W Contrast  Result Date: 09/12/2018 CLINICAL DATA:  Restaging colon cancer. EXAM: CT CHEST, ABDOMEN, AND PELVIS WITH CONTRAST TECHNIQUE: Multidetector CT imaging of the chest, abdomen and pelvis was performed following the standard protocol during bolus administration of intravenous contrast. CONTRAST:  135m OMNIPAQUE IOHEXOL 300 MG/ML  SOLN COMPARISON:  CT scan 09/10/2017 FINDINGS: CT CHEST FINDINGS Cardiovascular: The heart is normal in size. No pericardial effusion. There is mild fusiform dilatation of the ascending aorta with maximum diameter of 4 cm. This is stable. No dissection. The branch vessels are patent. Scattered atherosclerotic calcifications. Stable coronary artery calcifications. Mediastinum/Nodes: No mediastinal or hilar mass or adenopathy. The esophagus is grossly normal. Lungs/Pleura: No acute pulmonary findings or worrisome pulmonary lesions. There are small scattered sub 4 mm pulmonary nodules which are stable and likely benign. There are also scattered calcifications. Patchy tree-in-bud appearance in the right upper lobe is likely chronic inflammation or atypical infection. Musculoskeletal: No chest wall mass, supraclavicular or axillary adenopathy. The bony structures are unremarkable. CT ABDOMEN PELVIS FINDINGS Hepatobiliary: No focal hepatic lesions other than a small stable segment 6  cyst. Small layering gallstones are noted in the gallbladder. No biliary dilatation. Pancreas: No mass, inflammation or ductal dilatation. Spleen: Normal size.  No focal lesions. Adrenals/Urinary Tract: The adrenal glands and kidneys are unremarkable except for stable renal cysts. The bladder is unremarkable. Stomach/Bowel: The stomach, duodenum and small bowel are unremarkable. No acute inflammatory changes, mass lesions or obstructive findings. The terminal ileum is normal. The colon demonstrates a moderate amount of stool throughout it but no mass, inflammation or obstruction. Surgical changes involving the rectum but no findings suspicious for recurrent tumor. Vascular/Lymphatic: Stable atherosclerotic calcifications involving the abdominal aorta. Stable 3.0 x 3.0 cm infrarenal abdominal aortic aneurysm. The major venous structures are patent. No mesenteric or retroperitoneal mass or adenopathy. Small scattered lymph nodes are stable. Reproductive: The prostate gland is mildly enlarged and there is median lobe hypertrophy impressing on the base of the bladder. The seminal vesicles appear normal. Other: No inguinal mass or adenopathy. No pelvic adenopathy or free pelvic fluid collections. Musculoskeletal: No significant bony findings. IMPRESSION: 1. Stable surgical changes from rectosigmoid resection. No findings for residual or recurrent tumor or metastatic disease. 2. Stable fusiform aneurysmal dilatation of the ascending aorta with maximum measurement of 4.0 cm. There is also a stable 3.0 cm infrarenal abdominal aortic aneurysm. 3. No findings to suggest pulmonary metastatic disease. Chronic bronchitic and inflammatory changes but no worrisome lesions. Electronically Signed   By: PMarijo SanesM.D.   On: 09/12/2018 16:19    Medications: I have reviewed the patient's current medications.   Assessment/Plan: 1. Adenocarcinoma at 20 cm from the anal verge, most consistent with sigmoid colon  cancer ? Staging PET scan 07/05/2015 with no evidence of metastatic disease ? Endoscopic ultrasound 06/29/2015 consistent  with a T2N0 tumor ? Status post a robotic low anterior resection 08/02/2015 with tumor confirmed above the peritoneal reflection, pT2 vs pT3,N1b, focally positive circumferential margin, no loss of mismatch repair protein expression ? Cycle 1 adjuvant Xeloda 09/02/2015 ? Cycle 2 adjuvant Xeloda 09/23/2015 ? Cycle 3 adjuvant Xeloda 10/14/2015; dose reduced to 1000 mg in the morning and 500 mg in the evening for 14 days followed by 7 days off (skin rash, hand-foot syndrome) ? Cycle 4 adjuvant Xeloda 11/04/2015 ? Cycle 5 adjuvant Xeloda 11/25/2015 ? Cycle 6 adjuvant Xeloda 12/16/2015 ? Cycle 7 adjuvant Xeloda 01/06/2016 ? Cycle 8 adjuvant Xeloda 01/27/2016 ? Colonoscopy 08/20/2016-few diverticula in the rectosigmoid colon. Repeat colonoscopy in 3 years for surveillance. ? CTs chest, abdomen, and pelvis 09/12/2016-negative for metastatic disease, stable right upper lobe nodule ? CTs 09/11/2017- resolution of a previously noted right upper lobe nodule, no evidence of metastatic disease ? CTs 09/12/2018- no evidence of metastatic disease  2. History of coronary artery disease  3. History ofMild hyperbilirubinemia -chronic, [place]?  Disposition: Mr. Barrick is in clinical remission from colon cancer.  He will return for an office visit and CEA in 6 months.  15 minutes were spent with the patient today.  The majority of the time was used for counseling and coordination of care.  [name], MD  09/15/2018  9:17 AM

## 2018-09-16 ENCOUNTER — Telehealth: Payer: Self-pay | Admitting: Oncology

## 2018-09-16 NOTE — Telephone Encounter (Signed)
Scheduled appt per 12/09 los - reminder letter sent in the mail with apt date and time - 6 month f/u

## 2018-10-23 ENCOUNTER — Other Ambulatory Visit: Payer: Self-pay

## 2018-10-23 DIAGNOSIS — Z1212 Encounter for screening for malignant neoplasm of rectum: Principal | ICD-10-CM

## 2018-10-23 DIAGNOSIS — Z1211 Encounter for screening for malignant neoplasm of colon: Secondary | ICD-10-CM

## 2018-10-23 LAB — POC HEMOCCULT BLD/STL (HOME/3-CARD/SCREEN)
Card #3 Fecal Occult Blood, POC: NEGATIVE
FECAL OCCULT BLD: NEGATIVE
FECAL OCCULT BLD: NEGATIVE

## 2018-10-24 DIAGNOSIS — Z1211 Encounter for screening for malignant neoplasm of colon: Secondary | ICD-10-CM | POA: Diagnosis not present

## 2018-11-12 NOTE — Progress Notes (Signed)
FOLLOW UP  Assessment and Plan:   ASHD S/p PCA, continue ASA, statin Control blood pressure, cholesterol, glucose, increase exercise.   Hypertension Well controlled with current medications  Monitor blood pressure at home; patient to call if consistently greater than 130/80 Continue DASH diet.   Reminder to go to the ER if any CP, SOB, nausea, dizziness, severe HA, changes vision/speech, left arm numbness and tingling and jaw pain.  Cholesterol Currently at goal; continue atorvastatin 20 mg  Continue low cholesterol diet and exercise.  Check lipid panel.   Prediabetes Continue diet and exercise.  Perform daily foot/skin check, notify office of any concerning changes.  Check A1C  BMi 23 Continue to recommend diet heavy in fruits and veggies and low in animal meats, cheeses, and dairy products, appropriate calorie intake Discuss exercise recommendations routinely Continue to monitor weight at each visit  Vitamin D Def At goal at last visit; continue supplementation to maintain goal of 60-100 Defer Vit D level  Continue diet and meds as discussed. Further disposition pending results of labs. Discussed med's effects and SE's.   Over 30 minutes of exam, counseling, chart review, and critical decision making was performed.   Future Appointments  Date Time Provider [place]  02/19/2019  9:30 AM Unk Pinto, MD [facility] None  03/17/2019 11:30 AM [facility] 2 [facility] None  03/17/2019 12:00 PM Ladell Pier, MD [facility] None  05/19/2019 10:45 AM [name], NP [facility] None  08/28/2019 10:00 AM Unk Pinto, MD [facility] None    ----------------------------------------------------------------------------------------------------------------------  HPI 85 y.o. male  presents for 3 month follow up on hypertension, cholesterol, prediabetes, weight and vitamin D deficiency. Patient was diagnosed with colon cancer in Oct 2016, s/p resection  08/02/2015 by Dr. Barry Dienes, completed final cycle of chemotherapy with Dr. Benay Spice on 02/09/2016, following CEAs with Dr. Amedeo Plenty. In 1981 he underwent PCA and has done well since. Negative Cardiolite (EF 86%) in 2008. GERD well controlled by current medications.   BMI is Body mass index is 23.5 kg/m., he has been working on diet and exercise, walks 30 min most days. Wt Readings from Last 3 Encounters:  11/13/18 163 lb 12.8 oz (74.3 kg)  09/15/18 162 lb 9.6 oz (73.8 kg)  08/13/18 162 lb 6.4 oz (73.7 kg)   He is not on BP medication, doesn't check Bps home, today their BP is BP: 136/64  He does workout. He denies chest pain, shortness of breath, dizziness.   He is on cholesterol medication Atorvastatin 20 mg and denies myalgias. His cholesterol is at goal. The cholesterol last visit was:   Lab Results  Component Value Date   CHOL 145 05/12/2018   HDL 41 05/12/2018   LDLCALC 79 05/12/2018   TRIG 148 05/12/2018   CHOLHDL 3.5 05/12/2018    He has been working on diet and exercise for prediabetes, wife is strict with diet at home, and denies increased appetite, nausea, paresthesia of the feet, polydipsia, polyuria and visual disturbances. Last A1C in the office was:  Lab Results  Component Value Date   HGBA1C 5.8 (H) 08/13/2018   Patient is on Vitamin D supplement.   Lab Results  Component Value Date   VD25OH 70 08/13/2018        Current Medications:  Current Outpatient Medications on File Prior to Visit  Medication Sig  . aspirin 81 MG tablet Take 81 mg by mouth every morning.   Marland Kitchen atorvastatin (LIPITOR) 40 MG tablet TAKE 1 TABLET DAILY FOR  CHOLESTEROL (Patient taking differently: Take 1/2 tablet daily for Cholesterol)  . Cholecalciferol (VITAMIN D PO) Take 2,000 Int'l Units by mouth every morning.   . loperamide (IMODIUM) 2 MG capsule Take 2 mg by mouth as needed for diarrhea or loose stools.  . Magnesium 250 MG TABS Take 1 tablet by mouth 2 (two) times daily.   . Omega-3 Fatty  Acids (FISH OIL PO) Take 1 tablet by mouth every morning.   . ranitidine (ZANTAC) 150 MG tablet TAKE 1 TABLET DAILY FOR    INDIGESTION AND ACID REFLUX (Patient taking differently: as needed. Take 1 tablet daily for Indigestion and Acid Reflux)  . triamcinolone ointment (KENALOG) 0.1 % Apply 1 application topically 2 (two) times daily.   No current facility-administered medications on file prior to visit.      Allergies:  Allergies  Allergen Reactions  . Penicillins           Medical History:  Past Medical History:  Diagnosis Date  . Cancer ([place])    cancer of recto-sigmoid junction-dx 07/2015  . Elevated PSA   . GERD (gastroesophageal reflux disease)   . Hemorrhoids   . Hyperlipidemia   . Hypertension   . Pneumonia    age 86  . Vitamin D deficiency    Family history- Reviewed and unchanged Social history- Reviewed and unchanged   Review of Systems:  Review of Systems  Constitutional: Negative for malaise/fatigue and weight loss.  HENT: Negative for hearing loss and tinnitus.   Eyes: Negative for blurred vision and double vision.  Respiratory: Negative for cough, shortness of breath and wheezing.   Cardiovascular: Negative for chest pain, palpitations, orthopnea, claudication and leg swelling.  Gastrointestinal: Negative for abdominal pain, blood in stool, constipation, diarrhea, heartburn, melena, nausea and vomiting.  Genitourinary: Negative.   Musculoskeletal: Negative for joint pain and myalgias.  Skin: Negative for rash.  Neurological: Negative for dizziness, tingling, sensory change, weakness and headaches.  Endo/Heme/Allergies: Negative for polydipsia.  Psychiatric/Behavioral: Negative.   All other systems reviewed and are negative.     Physical Exam: BP 136/64   Pulse 62   Temp (!) 97.3 F (36.3 C)   Ht 5\' 10"  (1.778 m)   Wt 163 lb 12.8 oz (74.3 kg)   SpO2 97%   BMI 23.50 kg/m  Wt Readings from Last 3 Encounters:  11/13/18 163 lb 12.8 oz (74.3 kg)   09/15/18 162 lb 9.6 oz (73.8 kg)  08/13/18 162 lb 6.4 oz (73.7 kg)   General Appearance: Well nourished, in no apparent distress. Eyes: PERRLA, EOMs, conjunctiva no swelling or erythema Sinuses: No Frontal/maxillary tenderness ENT/Mouth: Ext aud canals clear, TMs without erythema, bulging. No erythema, swelling, or exudate on post pharynx.  Tonsils not swollen or erythematous. Hearing normal.  Neck: Supple, thyroid normal.  Respiratory: Respiratory effort normal, BS equal bilaterally without rales, rhonchi, wheezing or stridor.  Cardio: RRR with no MRGs. Brisk peripheral pulses without edema.  Abdomen: Soft, + BS.  Non tender, no guarding, rebound, hernias, masses. Lymphatics: Non tender without lymphadenopathy.  Musculoskeletal: Full ROM, 5/5 strength, Normal gait Skin: Warm, dry without rashes, lesions, ecchymosis.  Neuro: Cranial nerves intact. No cerebellar symptoms.  Psych: Awake and oriented X 3, normal affect, Insight and Judgment appropriate.    Izora Ribas, NP 9:52 AM [facility]

## 2018-11-13 ENCOUNTER — Encounter: Payer: Self-pay | Admitting: Adult Health

## 2018-11-13 ENCOUNTER — Ambulatory Visit (INDEPENDENT_AMBULATORY_CARE_PROVIDER_SITE_OTHER): Payer: Medicare HMO | Admitting: Adult Health

## 2018-11-13 VITALS — BP 136/64 | HR 62 | Temp 97.3°F | Ht 70.0 in | Wt 163.8 lb

## 2018-11-13 DIAGNOSIS — E559 Vitamin D deficiency, unspecified: Secondary | ICD-10-CM

## 2018-11-13 DIAGNOSIS — R0989 Other specified symptoms and signs involving the circulatory and respiratory systems: Secondary | ICD-10-CM | POA: Diagnosis not present

## 2018-11-13 DIAGNOSIS — Z79899 Other long term (current) drug therapy: Secondary | ICD-10-CM | POA: Diagnosis not present

## 2018-11-13 DIAGNOSIS — E782 Mixed hyperlipidemia: Secondary | ICD-10-CM | POA: Diagnosis not present

## 2018-11-13 DIAGNOSIS — K219 Gastro-esophageal reflux disease without esophagitis: Secondary | ICD-10-CM

## 2018-11-13 DIAGNOSIS — R7303 Prediabetes: Secondary | ICD-10-CM

## 2018-11-13 DIAGNOSIS — I2510 Atherosclerotic heart disease of native coronary artery without angina pectoris: Secondary | ICD-10-CM | POA: Diagnosis not present

## 2018-11-13 DIAGNOSIS — Z6823 Body mass index (BMI) 23.0-23.9, adult: Secondary | ICD-10-CM | POA: Diagnosis not present

## 2018-11-13 NOTE — Patient Instructions (Addendum)
Goals    . Blood Pressure < 140/80    . LDL CALC < 130      Know what a healthy weight is for you (roughly BMI <25) and aim to maintain this  Aim for 7+ servings of fruits and vegetables daily  65-80+ fluid ounces of water or unsweet tea for healthy kidneys  Limit to max 1 drink of alcohol per day; avoid smoking/tobacco  Limit animal fats in diet for cholesterol and heart health - choose grass fed whenever available  Avoid highly processed foods, and foods high in saturated/trans fats  Aim for low stress - take time to unwind and care for your mental health  Aim for 150 min of moderate intensity exercise weekly for heart health, and weights twice weekly for bone health  Aim for 7-9 hours of sleep daily       When it comes to diets, agreement about the perfect plan isn't easy to find, even among the experts. Experts at the [employer] developed an idea known as the Healthy Eating Plate. Just imagine a plate divided into logical, healthy portions.  The emphasis is on diet quality:  Load up on vegetables and fruits - one-half of your plate: Aim for color and variety, and remember that potatoes don't count.  Go for whole grains - one-quarter of your plate: Whole wheat, barley, wheat berries, quinoa, oats, brown rice, and foods made with them. If you want pasta, go with whole wheat pasta.  Protein power - one-quarter of your plate: Fish, chicken, beans, and nuts are all healthy, versatile protein sources. Limit red meat.  The diet, however, does go beyond the plate, offering a few other suggestions.  Use healthy plant oils, such as olive, canola, soy, corn, sunflower and peanut. Check the labels, and avoid partially hydrogenated oil, which have unhealthy trans fats.  If you're thirsty, drink water. Coffee and tea are good in moderation, but skip sugary drinks and limit milk and dairy products to one or two daily servings.  The type of carbohydrate in the  diet is more important than the amount. Some sources of carbohydrates, such as vegetables, fruits, whole grains, and beans-are healthier than others.  Finally, stay active.

## 2018-11-14 LAB — TSH: TSH: 1.66 mIU/L (ref 0.40–4.50)

## 2018-11-14 LAB — COMPLETE METABOLIC PANEL WITH GFR
AG RATIO: 1.5 (calc) (ref 1.0–2.5)
ALKALINE PHOSPHATASE (APISO): 59 U/L (ref 35–144)
ALT: 15 U/L (ref 9–46)
AST: 20 U/L (ref 10–35)
Albumin: 4 g/dL (ref 3.6–5.1)
BUN: 15 mg/dL (ref 7–25)
CHLORIDE: 103 mmol/L (ref 98–110)
CO2: 32 mmol/L (ref 20–32)
Calcium: 9.4 mg/dL (ref 8.6–10.3)
Creat: 0.78 mg/dL (ref 0.70–1.11)
GFR, Est African American: 96 mL/min/{1.73_m2} (ref >=60)
GFR, Est Non African American: 83 mL/min/{1.73_m2} (ref >=60)
Globulin: 2.6 g/dL (calc) (ref 1.9–3.7)
Glucose, Bld: 107 mg/dL — ABNORMAL HIGH (ref 65–99)
Potassium: 4.8 mmol/L (ref 3.5–5.3)
Sodium: 139 mmol/L (ref 135–146)
Total Bilirubin: 1.3 mg/dL — ABNORMAL HIGH (ref 0.2–1.2)
Total Protein: 6.6 g/dL (ref 6.1–8.1)

## 2018-11-14 LAB — HEMOGLOBIN A1C
HEMOGLOBIN A1C: 5.7 %{Hb} — AB (ref <5.7)
Mean Plasma Glucose: 117 (calc)
eAG (mmol/L): 6.5 (calc)

## 2018-11-14 LAB — CBC WITH DIFFERENTIAL/PLATELET
Absolute Monocytes: 434 cells/uL (ref 200–950)
BASOS PCT: 0.4 %
Basophils Absolute: 20 cells/uL (ref 0–200)
EOS ABS: 230 {cells}/uL (ref 15–500)
Eosinophils Relative: 4.5 %
HCT: 44.9 % (ref 38.5–50.0)
Hemoglobin: 14.9 g/dL (ref 13.2–17.1)
Lymphs Abs: 1464 cells/uL (ref 850–3900)
MCH: 30 pg (ref 27.0–33.0)
MCHC: 33.2 g/dL (ref 32.0–36.0)
MCV: 90.5 fL (ref 80.0–100.0)
MPV: 10.9 fL (ref 7.5–12.5)
Monocytes Relative: 8.5 %
Neutro Abs: 2953 cells/uL (ref 1500–7800)
Neutrophils Relative %: 57.9 %
Platelets: 166 10*3/uL (ref 140–400)
RBC: 4.96 10*6/uL (ref 4.20–5.80)
RDW: 12.2 % (ref 11.0–15.0)
Total Lymphocyte: 28.7 %
WBC: 5.1 10*3/uL (ref 3.8–10.8)

## 2018-11-14 LAB — LIPID PANEL
CHOLESTEROL: 153 mg/dL (ref <200)
HDL: 46 mg/dL (ref >=40)
LDL Cholesterol (Calc): 83 mg/dL (calc)
Non-HDL Cholesterol (Calc): 107 mg/dL (calc) (ref <130)
Total CHOL/HDL Ratio: 3.3 (calc) (ref <5.0)
Triglycerides: 144 mg/dL (ref <150)

## 2019-02-18 NOTE — Progress Notes (Signed)
THIS ENCOUNTER IS A VIRTUAL VISIT DUE TO COVID-19 - PATIENT WAS NOT SEEN IN THE OFFICE.  PATIENT HAS CONSENTED TO VIRTUAL VISIT / TELEMEDICINE VISIT  This provider placed a call to Keith [name] using telephone, his appointment was changed to a virtual office visit to reduce the risk of exposure to the COVID-19 virus and to help Keith [name] remain healthy and safe. The virtual visit will also provide continuity of care. He verbalizes understanding.   Virtual Visit via telephone Note  I connected with  Keith [name]  on 02/19/19  by telephone.  I verified that I am speaking with the correct person using two identifiers.        I discussed the limitations of evaluation and management by telemedicine and the availability of in person appointments. The patient expressed understanding and agreed to proceed.  History of Present Illness:      This very nice 85 y.o. MWM presents for 6 month follow up with HTN, HLD, Pre-Diabetes and Vitamin D Deficiency.  Patient has hx/o GERD quiescent on his meds. Patient underwent Colon Ca surg in 2016 and was treated with Xeloda and he continues regular f/u with Dr Benay Spice. With serial surveillance abd CT scans.      06/29/2015 Flex Sig (+) Rectal Ca after (+) Cologard   08/02/2015 - Rob. Partial colectomy - Dr Barry Dienes   04/19/2016 - Dr Amedeo Plenty - Colon s/p partial colectomy recc 3 yr f/u  Due July 2020      Patient has hx/o labile HTN & underwent PCA in 1991 and last Cardiolite in 2008 was negative & Normal w/EF 86%.  BP has been controlled at home. Today's  . Patient has had no complaints of any cardiac type chest pain, palpitations, dyspnea / orthopnea / PND, dizziness, claudication, or dependent edema.      Hyperlipidemia is controlled with diet & Atorvastatin. Patient denies myalgias or other med SE's. Last Lipids were at goal: Lab Results  Component Value Date   CHOL 153 11/13/2018   HDL 46 11/13/2018   LDLCALC 83 11/13/2018   TRIG 144 11/13/2018    CHOLHDL 3.3 11/13/2018       Also, the patient has history of PreDiabetes (A1c 5.9% / 2014)  and has had no symptoms of reactive hypoglycemia, diabetic polys, paresthesias or visual blurring.  Last A1c was  Lab Results  Component Value Date   HGBA1C 5.7 (H) 11/13/2018      Further, the patient also has history of Vitamin D Deficiency ("27" / 2009)   and supplements vitamin D without any suspected side-effects. Last vitamin D was Lab Results  Component Value Date   VD25OH 54 08/13/2018   Current Outpatient Medications on File Prior to Visit  Medication Sig  . aspirin 81 MG tablet Take 81 mg by mouth every morning.   Marland Kitchen atorvastatin (LIPITOR) 40 MG tablet TAKE 1 TABLET DAILY FOR    CHOLESTEROL (Patient taking differently: Take 1/2 tablet daily for Cholesterol)  . Cholecalciferol (VITAMIN D PO) Take 2,000 Int'l Units by mouth every morning.   . loperamide (IMODIUM) 2 MG capsule Take 2 mg by mouth as needed for diarrhea or loose stools.  . Magnesium 250 MG TABS Take 1 tablet by mouth 2 (two) times daily.   . Omega-3 Fatty Acids (FISH OIL PO) Take 1 tablet by mouth every morning.   . ranitidine (ZANTAC) 150 MG tablet TAKE 1 TABLET DAILY FOR    INDIGESTION AND ACID REFLUX (Patient  taking differently: as needed. Take 1 tablet daily for Indigestion and Acid Reflux)  . triamcinolone ointment (KENALOG) 0.1 % Apply 1 application topically 2 (two) times daily.   No current facility-administered medications on file prior to visit.    Allergies  Allergen Reactions  . Penicillins         PMHx:   Past Medical History:  Diagnosis Date  . Cancer ([place])    cancer of recto-sigmoid junction-dx 07/2015  . Elevated PSA   . GERD (gastroesophageal reflux disease)   . Hemorrhoids   . Hyperlipidemia   . Hypertension   . Pneumonia    age 32  . Vitamin D deficiency    Immunization History  Administered Date(s) Administered  . Influenza Split 07/29/2013, 06/22/2015  . Influenza, High Dose Seasonal  PF 06/15/2014, 06/12/2016  . Influenza-Unspecified 06/04/2017, 06/10/2018  . Pneumococcal Conjugate-13 06/15/2014  . Pneumococcal Polysaccharide-23 03/12/2016  . Td 02/09/2010  . Zoster 10/08/2000   Past Surgical History:  Procedure Laterality Date  . EUS N/A 06/29/2015   Procedure: LOWER ENDOSCOPIC ULTRASOUND (EUS);  Surgeon: Arta Silence, MD;  Location: [facility];  Service: Endoscopy;  Laterality: N/A;  . HERNIA REPAIR     left inguinal  . XI ROBOTIC ASSISTED LOWER ANTERIOR RESECTION N/A 08/02/2015   Procedure: XI ROBOTIC ASSISTED LOWER ANTERIOR RESECTION;  Surgeon: Stark Klein, MD;  Location: [facility];  Service: General;  Laterality: N/A;   FHx:    Reviewed / unchanged SHx:    Reviewed / unchanged   Systems Review:  Constitutional: Denies fever, chills, wt changes, headaches, insomnia, fatigue, night sweats, change in appetite. Eyes: Denies redness, blurred vision, diplopia, discharge, itchy, watery eyes.  ENT: Denies discharge, congestion, post nasal drip, epistaxis, sore throat, earache, hearing loss, dental pain, tinnitus, vertigo, sinus pain, snoring.  CV: Denies chest pain, palpitations, irregular heartbeat, syncope, dyspnea, diaphoresis, orthopnea, PND, claudication or edema. Respiratory: denies cough, dyspnea, DOE, pleurisy, hoarseness, laryngitis, wheezing.  Gastrointestinal: Denies dysphagia, odynophagia, heartburn, reflux, water brash, abdominal pain or cramps, nausea, vomiting, bloating, diarrhea, constipation, hematemesis, melena, hematochezia  or hemorrhoids. Genitourinary: Denies dysuria, frequency, urgency, nocturia, hesitancy, discharge, hematuria or flank pain. Musculoskeletal: Denies arthralgias, myalgias, stiffness, jt. swelling, pain, limping or strain/sprain.  Skin: Denies pruritus, rash, hives, warts, acne, eczema or change in skin lesion(s). Neuro: No weakness, tremor, incoordination, spasms, paresthesia or pain. Psychiatric: Denies confusion, memory loss  or sensory loss. Endo: Denies change in weight, skin or hair change.  Heme/Lymph: No excessive bleeding, bruising or enlarged lymph nodes.  Physical Exam  Unable to check BP  & Pulse at home.   Weight 161#  General : Well sounding patient in no apparent distress HEENT: no hoarseness, no cough for duration of visit Lungs: speaks in complete sentences, no audible wheezing, no apparent distress Neurological: alert, oriented x 3 Psychiatric: pleasant, judgement appropriate   Assessment and Plan:  1. Labile hypertension  - Continue medication, monitor blood pressure at home.  - Continue DASH diet.  Reminder to go to the ER if any CP,  SOB, nausea, dizziness, severe HA, changes vision/speech.  2. Hyperlipidemia, mixed  - Continue diet/meds, exercise,& lifestyle modifications.  - Continue monitor periodic cholesterol/liver & renal functions   3. Abnormal glucose  - Continue diet, exercise  - Lifestyle modifications.  - Monitor appropriate labs.  4. Vitamin D deficiency  - Continue supplementation.  5. Prediabetes  - Continue diet, exercise  - Lifestyle modifications.  - Monitor appropriate labs.  6. Gastroesophageal reflux disease  7. History of rectal cancer       Discussed  regular exercise, BP monitoring, weight control to achieve/maintain BMI less than 25 and discussed med and SE's. Recommended labs to assess and monitor clinical status with further disposition pending results of labs. I discussed the assessment and treatment plan with the patient. The patient was provided an opportunity to ask questions and all were answered. The patient agreed with the plan and demonstrated an understanding of the instructions.  I provided 19 minutes of non-face-to-face time during this encounter and over 26 minutes of  counseling, chart review and  complex critical decision making was performed   Keith Bouchard, MD

## 2019-02-19 ENCOUNTER — Encounter: Payer: Self-pay | Admitting: Internal Medicine

## 2019-02-19 ENCOUNTER — Ambulatory Visit: Payer: Self-pay | Admitting: Internal Medicine

## 2019-02-19 ENCOUNTER — Ambulatory Visit: Payer: Medicare HMO | Admitting: Internal Medicine

## 2019-02-19 ENCOUNTER — Other Ambulatory Visit: Payer: Self-pay

## 2019-02-19 VITALS — Wt 161.0 lb

## 2019-02-19 DIAGNOSIS — R7309 Other abnormal glucose: Secondary | ICD-10-CM

## 2019-02-19 DIAGNOSIS — R7303 Prediabetes: Secondary | ICD-10-CM

## 2019-02-19 DIAGNOSIS — K219 Gastro-esophageal reflux disease without esophagitis: Secondary | ICD-10-CM | POA: Diagnosis not present

## 2019-02-19 DIAGNOSIS — Z85048 Personal history of other malignant neoplasm of rectum, rectosigmoid junction, and anus: Secondary | ICD-10-CM

## 2019-02-19 DIAGNOSIS — R0989 Other specified symptoms and signs involving the circulatory and respiratory systems: Secondary | ICD-10-CM | POA: Diagnosis not present

## 2019-02-19 DIAGNOSIS — E559 Vitamin D deficiency, unspecified: Secondary | ICD-10-CM

## 2019-02-19 DIAGNOSIS — E782 Mixed hyperlipidemia: Secondary | ICD-10-CM | POA: Diagnosis not present

## 2019-02-19 NOTE — Patient Instructions (Signed)
Coronavirus (COVID-19) Are you at risk?  Are you at risk for the Coronavirus (COVID-19)?  To be considered HIGH RISK for Coronavirus (COVID-19), you have to meet the following criteria:  . Traveled to [place], [place], [place], [place] or [place]; or in the [place] to [place], [place], [place]  . or [place]; and have fever, cough, and shortness of breath within the last 2 weeks of travel OR . Been in close contact with a person diagnosed with COVID-19 within the last 2 weeks and have  . fever, cough,and shortness of breath .  . IF YOU DO NOT MEET THESE CRITERIA, YOU ARE CONSIDERED LOW RISK FOR COVID-19.  What to do if you are HIGH RISK for COVID-19?  . If you are having a medical emergency, call 911. . Seek medical care right away. Before you go to a doctor's office, urgent care or emergency department, .  call ahead and tell them about your recent travel, contact with someone diagnosed with COVID-19  .  and your symptoms.  . You should receive instructions from your physician's office regarding next steps of care.  . When you arrive at healthcare provider, tell the healthcare staff immediately you have returned from  . visiting [place], [place], [place], [place] or [place]; or traveled in the [place] to [place], [place],  . [place] or [place] in the last two weeks or you have been in close contact with a person diagnosed with  . COVID-19 in the last 2 weeks.   . Tell the health care staff about your symptoms: fever, cough and shortness of breath. . After you have been seen by a medical provider, you will be either: o Tested for (COVID-19) and discharged home on quarantine except to seek medical care if  o symptoms worsen, and asked to  - Stay home and avoid contact with others until you get your results (4-5 days)  - Avoid travel on public transportation if possible (such as bus, train, or airplane) or o Sent to the Emergency Department by EMS for evaluation,  COVID-19 testing  and  o possible admission depending on your condition and test results.  What to do if you are LOW RISK for COVID-19?  Reduce your risk of any infection by using the same precautions used for avoiding the common cold or flu:  . Wash your hands often with soap and warm water for at least 20 seconds.  If soap and water are not readily available,  . use an alcohol-based hand sanitizer with at least 60% alcohol.  . If coughing or sneezing, cover your mouth and nose by coughing or sneezing into the elbow areas of your shirt or coat, .  into a tissue or into your sleeve (not your hands). . Avoid shaking hands with others and consider head nods or verbal greetings only. . Avoid touching your eyes, nose, or mouth with unwashed hands.  . Avoid close contact with people who are sick. . Avoid places or events with large numbers of people in one location, like concerts or sporting events. . Carefully consider travel plans you have or are making. . If you are planning any travel outside or inside the [place], visit the CDC's Travelers' Health webpage for the latest health notices. . If you have some symptoms but not all symptoms, continue to monitor at home and seek medical attention  . if your symptoms worsen. . If you are having a medical emergency, call 911. >>>>>>>>>>>>>>>>>>>>>>>>>>>>>>>>>>>>>>>>>>>>>>>>>>>>>>>   We Do NOT Approve of  [facility] [place] Soliciting Our Patients  To Do Home Visits  & We Do NOT Approve of [employer] > > > > > > > > > > > > > > > > > > > > > > > > > > > > > > > > > > >  > > > >   Preventive Care for Adults  A healthy lifestyle and preventive care can promote health and wellness. Preventive health guidelines for men include the following key practices:  A routine yearly physical is a good way to check with your health care provider about your health and preventative screening. It is a chance to share any concerns and updates on  your health and to receive a thorough exam.  Visit your dentist for a routine exam and preventative care every 6 months. Brush your teeth twice a day and floss once a day. Good oral hygiene prevents tooth decay and gum disease.  The frequency of eye exams is based on your age, health, family medical history, use of contact lenses, and other factors. Follow your health care provider's recommendations for frequency of eye exams.  Eat a healthy diet. Foods such as vegetables, fruits, whole grains, low-fat dairy products, and lean protein foods contain the nutrients you need without too many calories. Decrease your intake of foods high in solid fats, added sugars, and salt. Eat the right amount of calories for you. Get information about a proper diet from your health care provider, if necessary.  Regular physical exercise is one of the most important things you can do for your health. Most adults should get at least 150 minutes of moderate-intensity exercise (any activity that increases your heart rate and causes you to sweat) each week. In addition, most adults need muscle-strengthening exercises on 2 or more days a week.  Maintain a healthy weight. The body mass index (BMI) is a screening tool to identify possible weight problems. It provides an estimate of body fat based on height and weight. Your health care provider can find your BMI and can help you achieve or maintain a healthy weight. For adults 20 years and older:  A BMI below 18.5 is considered underweight.  A BMI of 18.5 to 24.9 is normal.  A BMI of 25 to 29.9 is considered overweight.  A BMI of 30 and above is considered obese.  Maintain normal blood lipids and cholesterol levels by exercising and minimizing your intake of saturated fat. Eat a balanced diet with plenty of fruit and vegetables. Blood tests for lipids and cholesterol should begin at age 20 and be repeated every 5 years. If your lipid or cholesterol levels are high, you are  over 50, or you are at high risk for heart disease, you may need your cholesterol levels checked more frequently. Ongoing high lipid and cholesterol levels should be treated with medicines if diet and exercise are not working.  If you smoke, find out from your health care provider how to quit. If you do not use tobacco, do not start.  Lung cancer screening is recommended for adults aged 55-80 years who are at high risk for developing lung cancer because of a history of smoking. A yearly low-dose CT scan of the lungs is recommended for people who have at least a 30-pack-year history of smoking and are a current smoker or have quit within the past 15 years. A pack year of smoking is smoking an average of 1   pack of cigarettes a day for 1 year (for example: 1 pack a day for 30 years or 2 packs a day for 15 years). Yearly screening should continue until the smoker has stopped smoking for at least 15 years. Yearly screening should be stopped for people who develop a health problem that would prevent them from having lung cancer treatment.  If you choose to drink alcohol, do not have more than 2 drinks per day. One drink is considered to be 12 ounces (355 mL) of beer, 5 ounces (148 mL) of wine, or 1.5 ounces (44 mL) of liquor.  Avoid use of street drugs. Do not share needles with anyone. Ask for help if you need support or instructions about stopping the use of drugs.  High blood pressure causes heart disease and increases the risk of stroke. Your blood pressure should be checked at least every 1-2 years. Ongoing high blood pressure should be treated with medicines, if weight loss and exercise are not effective.  If you are 45-79 years old, ask your health care provider if you should take aspirin to prevent heart disease.  Diabetes screening involves taking a blood sample to check your fasting blood sugar level. Testing should be considered at a younger age or be carried out more frequently if you are  overweight and have at least 1 risk factor for diabetes.  Colorectal cancer can be detected and often prevented. Most routine colorectal cancer screening begins at the age of 50 and continues through age 75. However, your health care provider may recommend screening at an earlier age if you have risk factors for colon cancer. On a yearly basis, your health care provider may provide home test kits to check for hidden blood in the stool. Use of a small camera at the end of a tube to directly examine the colon (sigmoidoscopy or colonoscopy) can detect the earliest forms of colorectal cancer. Talk to your health care provider about this at age 50, when routine screening begins. Direct exam of the colon should be repeated every 5-10 years through age 75, unless early forms of precancerous polyps or small growths are found.  Hepatitis C blood testing is recommended for all people born from 1945 through 1965 and any individual with known risks for hepatitis C.  Screening for abdominal aortic aneurysm (AAA)  by ultrasound is recommended for people who have history of high blood pressure or who are current or former smokers.  Healthy men should  receive prostate-specific antigen (PSA) blood tests as part of routine cancer screening. Talk with your health care provider about prostate cancer screening.  Testicular cancer screening is  recommended for adult males. Screening includes self-exam, a health care provider exam, and other screening tests. Consult with your health care provider about any symptoms you have or any concerns you have about testicular cancer.  Use sunscreen. Apply sunscreen liberally and repeatedly throughout the day. You should seek shade when your shadow is shorter than you. Protect yourself by wearing long sleeves, pants, a wide-brimmed hat, and sunglasses year round, whenever you are outdoors.  Once a month, do a whole-body skin exam, using a mirror to look at the skin on your back. Tell  your health care provider about new moles, moles that have irregular borders, moles that are larger than a pencil eraser, or moles that have changed in shape or color.  Stay current with required vaccines (immunizations).  Influenza vaccine. All adults should be immunized every year.  Tetanus, diphtheria, and acellular   pertussis (Td, Tdap) vaccine. An adult who has not previously received Tdap or who does not know his vaccine status should receive 1 dose of Tdap. This initial dose should be followed by tetanus and diphtheria toxoids (Td) booster doses every 10 years. Adults with an unknown or incomplete history of completing a 3-dose immunization series with Td-containing vaccines should begin or complete a primary immunization series including a Tdap dose. Adults should receive a Td booster every 10 years.  Zoster vaccine. One dose is recommended for adults aged 60 years or older unless certain conditions are present.    PREVNAR - Pneumococcal 13-valent conjugate (PCV13) vaccine. When indicated, a person who is uncertain of his immunization history and has no record of immunization should receive the PCV13 vaccine. An adult aged 19 years or older who has certain medical conditions and has not been previously immunized should receive 1 dose of PCV13 vaccine. This PCV13 should be followed with a dose of pneumococcal polysaccharide (PPSV23) vaccine. The PPSV23 vaccine dose should be obtained 1 or more year(s)after the dose of PCV13 vaccine. An adult aged 19 years or older who has certain medical conditions and previously received 1 or more doses of PPSV23 vaccine should receive 1 dose of PCV13. The PCV13 vaccine dose should be obtained 1 or more years after the last PPSV23 vaccine dose.    PNEUMOVAX - Pneumococcal polysaccharide (PPSV23) vaccine. When PCV13 is also indicated, PCV13 should be obtained first. All adults aged 65 years and older should be immunized. An adult younger than age 65 years who  has certain medical conditions should be immunized. Any person who resides in a nursing home or long-term care facility should be immunized. An adult smoker should be immunized. People with an immunocompromised condition and certain other conditions should receive both PCV13 and PPSV23 vaccines. People with human immunodeficiency virus (HIV) infection should be immunized as soon as possible after diagnosis. Immunization during chemotherapy or radiation therapy should be avoided. Routine use of PPSV23 vaccine is not recommended for American Indians, [place] Natives, or people younger than 65 years unless there are medical conditions that require PPSV23 vaccine. When indicated, people who have unknown immunization and have no record of immunization should receive PPSV23 vaccine. One-time revaccination 5 years after the first dose of PPSV23 is recommended for people aged 19-64 years who have chronic kidney failure, nephrotic syndrome, asplenia, or immunocompromised conditions. People who received 1-2 doses of PPSV23 before age 65 years should receive another dose of PPSV23 vaccine at age 65 years or later if at least 5 years have passed since the previous dose. Doses of PPSV23 are not needed for people immunized with PPSV23 at or after age 65 years.    Hepatitis A vaccine. Adults who wish to be protected from this disease, have certain high-risk conditions, work with hepatitis A-infected animals, work in hepatitis A research labs, or travel to or work in countries with a high rate of hepatitis A should be immunized. Adults who were previously unvaccinated and who anticipate close contact with an international adoptee during the first 60 days after arrival in the [place] from a country with a high rate of hepatitis A should be immunized.    Hepatitis B vaccine. Adults should be immunized if they wish to be protected from this disease, have certain high-risk conditions, may be exposed to blood or other  infectious body fluids, are household contacts or sex partners of hepatitis B positive people, are clients or workers in certain care facilities,   or travel to or work in countries with a high rate of hepatitis B.   Preventive Service / Frequency   Ages 65 and over  Blood pressure check.  Lipid and cholesterol check.  Lung cancer screening. / Every year if you are aged 55-80 years and have a 30-pack-year history of smoking and currently smoke or have quit within the past 15 years. Yearly screening is stopped once you have quit smoking for at least 15 years or develop a health problem that would prevent you from having lung cancer treatment.  Fecal occult blood test (FOBT) of stool. You may not have to do this test if you get a colonoscopy every 10 years.  Flexible sigmoidoscopy** or colonoscopy.** / Every 5 years for a flexible sigmoidoscopy or every 10 years for a colonoscopy beginning at age 50 and continuing until age 75.  Hepatitis C blood test.** / For all people born from 1945 through 1965 and any individual with known risks for hepatitis C.  Abdominal aortic aneurysm (AAA) screening./ Screening current or former smokers or have Hypertension.  Skin self-exam. / Monthly.  Influenza vaccine. / Every year.  Tetanus, diphtheria, and acellular pertussis (Tdap/Td) vaccine.** / 1 dose of Td every 10 years.   Zoster vaccine.** / 1 dose for adults aged 60 years or older.         Pneumococcal 13-valent conjugate (PCV13) vaccine.    Pneumococcal polysaccharide (PPSV23) vaccine.     Hepatitis A vaccine.** / Consult your health care provider.  Hepatitis B vaccine.** / Consult your health care provider. Screening for abdominal aortic aneurysm (AAA)  by ultrasound is recommended for people who have history of high blood pressure or who are current or former smokers. ++++++++++ Recommend Adult Low Dose Aspirin or  coated  Aspirin 81 mg daily  To reduce risk of Colon Cancer 20 %,   Skin Cancer 26 % ,  Malignant Melanoma 46%  and  Pancreatic cancer 60% ++++++++++++++++++++++ Vitamin D goal  is between 70-100.  Please make sure that you are taking your Vitamin D as directed.  It is very important as a natural anti-inflammatory  helping hair, skin, and nails, as well as reducing stroke and heart attack risk.  It helps your bones and helps with mood. It also decreases numerous cancer risks so please take it as directed.  Low Vit D is associated with a 200-300% higher risk for CANCER  and 200-300% higher risk for HEART   ATTACK  &  STROKE.   ...................................... It is also associated with higher death rate at younger ages,  autoimmune diseases like Rheumatoid arthritis, Lupus, Multiple Sclerosis.    Also many other serious conditions, like depression, Alzheimer's Dementia, infertility, muscle aches, fatigue, fibromyalgia - just to name a few. ++++++++++++++++++++++ Recommend the book "The END of DIETING" by Dr Joel Fuhrman  & the book "The END of DIABETES " by Dr Joel Fuhrman At [employer] - get book & Audio CD's    Being diabetic has a  300% increased risk for heart attack, stroke, cancer, and alzheimer- type vascular dementia. It is very important that you work harder with diet by avoiding all foods that are white. Avoid white rice (brown & wild rice is OK), white potatoes (sweetpotatoes in moderation is OK), White bread or wheat bread or anything made out of white flour like bagels, donuts, rolls, buns, biscuits, cakes, pastries, cookies, pizza crust, and pasta (made from white flour & egg whites) - vegetarian pasta or spinach or wheat   pasta is OK. Multigrain breads like Arnold's or [employer], or multigrain sandwich thins or flatbreads.  Diet, exercise and weight loss can reverse and cure diabetes in the early stages.  Diet, exercise and weight loss is very important in the control and prevention of complications of diabetes which affects every  system in your body, ie. Brain - dementia/stroke, eyes - glaucoma/blindness, heart - heart attack/heart failure, kidneys - dialysis, stomach - gastric paralysis, intestines - malabsorption, nerves - severe painful neuritis, circulation - gangrene & loss of a leg(s), and finally cancer and Alzheimers.    I recommend avoid fried & greasy foods,  sweets/candy, white rice (brown or wild rice or Quinoa is OK), white potatoes (sweet potatoes are OK) - anything made from white flour - bagels, doughnuts, rolls, buns, biscuits,white and wheat breads, pizza crust and traditional pasta made of white flour & egg white(vegetarian pasta or spinach or wheat pasta is OK).  Multi-grain bread is OK - like multi-grain flat bread or sandwich thins. Avoid alcohol in excess. Exercise is also important.    Eat all the vegetables you want - avoid meat, especially red meat and dairy - especially cheese.  Cheese is the most concentrated form of trans-fats which is the worst thing to clog up our arteries. Veggie cheese is OK which can be found in the fresh produce section at [employer] or [employer] or [employer]  ++++++++++++++++++++++ DASH Eating Plan  DASH stands for "Dietary Approaches to Stop Hypertension."   The DASH eating plan is a healthy eating plan that has been shown to reduce high blood pressure (hypertension). Additional health benefits may include reducing the risk of type 2 diabetes mellitus, heart disease, and stroke. The DASH eating plan may also help with weight loss. WHAT DO I NEED TO KNOW ABOUT THE DASH EATING PLAN? For the DASH eating plan, you will follow these general guidelines:  Choose foods with a percent daily value for sodium of less than 5% (as listed on the food label).  Use salt-free seasonings or herbs instead of table salt or sea salt.  Check with your health care provider or pharmacist before using salt substitutes.  Eat lower-sodium products, often labeled as "lower sodium" or "no  salt added."  Eat fresh foods.  Eat more vegetables, fruits, and low-fat dairy products.  Choose whole grains. Look for the word "whole" as the first word in the ingredient list.  Choose fish   Limit sweets, desserts, sugars, and sugary drinks.  Choose heart-healthy fats.  Eat veggie cheese   Eat more home-cooked food and less restaurant, buffet, and fast food.  Limit fried foods.  Cook foods using methods other than frying.  Limit canned vegetables. If you do use them, rinse them well to decrease the sodium.  When eating at a restaurant, ask that your food be prepared with less salt, or no salt if possible.                      WHAT FOODS CAN I EAT? Read Dr Joel Fuhrman's books on The End of Dieting & The End of Diabetes  Grains Whole grain or whole wheat bread. Brown rice. Whole grain or whole wheat pasta. Quinoa, bulgur, and whole grain cereals. Low-sodium cereals. Corn or whole wheat flour tortillas. Whole grain cornbread. Whole grain crackers. Low-sodium crackers.  Vegetables Fresh or frozen vegetables (raw, steamed, roasted, or grilled). Low-sodium or reduced-sodium tomato and vegetable juices. Low-sodium or reduced-sodium tomato sauce and paste. Low-sodium   or reduced-sodium canned vegetables.   Fruits All fresh, canned (in natural juice), or frozen fruits.  Protein Products  All fish and seafood.  Dried beans, peas, or lentils. Unsalted nuts and seeds. Unsalted canned beans.  Dairy Low-fat dairy products, such as skim or 1% milk, 2% or reduced-fat cheeses, low-fat ricotta or cottage cheese, or plain low-fat yogurt. Low-sodium or reduced-sodium cheeses.  Fats and Oils Tub margarines without trans fats. Light or reduced-fat mayonnaise and salad dressings (reduced sodium). Avocado. Safflower, olive, or canola oils. Natural peanut or almond butter.  Other Unsalted popcorn and pretzels. The items listed above may not be a complete list of recommended foods or  beverages. Contact your dietitian for more options.  ++++++++++++++++++++  WHAT FOODS ARE NOT RECOMMENDED? Grains/ White flour or wheat flour White bread. White pasta. White rice. Refined cornbread. Bagels and croissants. Crackers that contain trans fat.  Vegetables  Creamed or fried vegetables. Vegetables in a . Regular canned vegetables. Regular canned tomato sauce and paste. Regular tomato and vegetable juices.  Fruits Dried fruits. Canned fruit in light or heavy syrup. Fruit juice.  Meat and Other Protein Products Meat in general - RED meat & White meat.  Fatty cuts of meat. Ribs, chicken wings, all processed meats as bacon, sausage, bologna, salami, fatback, hot dogs, bratwurst and packaged luncheon meats.  Dairy Whole or 2% milk, cream, half-and-half, and cream cheese. Whole-fat or sweetened yogurt. Full-fat cheeses or blue cheese. Non-dairy creamers and whipped toppings. Processed cheese, cheese spreads, or cheese curds.  Condiments Onion and garlic salt, seasoned salt, table salt, and sea salt. Canned and packaged gravies. Worcestershire sauce. Tartar sauce. Barbecue sauce. Teriyaki sauce. Soy sauce, including reduced sodium. Steak sauce. Fish sauce. Oyster sauce. Cocktail sauce. Horseradish. Ketchup and mustard. Meat flavorings and tenderizers. Bouillon cubes. Hot sauce. Tabasco sauce. Marinades. Taco seasonings. Relishes.  Fats and Oils Butter, stick margarine, lard, shortening and bacon fat. Coconut, palm kernel, or palm oils. Regular salad dressings.  Pickles and olives. Salted popcorn and pretzels.  The items listed above may not be a complete list of foods and beverages to avoid.    

## 2019-03-03 ENCOUNTER — Telehealth: Payer: Self-pay | Admitting: *Deleted

## 2019-03-03 NOTE — Telephone Encounter (Signed)
Called to cancel his lab/OV in June ( 6 month appointment). Is doing fine and will have colonoscopy in the fall with Dr. Paulita Fujita. Would prefer to be seen in December after this is done.

## 2019-03-06 ENCOUNTER — Telehealth: Payer: Self-pay | Admitting: Oncology

## 2019-03-06 NOTE — Telephone Encounter (Signed)
Spoke with patient re December appointments.

## 2019-03-17 ENCOUNTER — Other Ambulatory Visit: Payer: Medicare HMO

## 2019-03-17 ENCOUNTER — Ambulatory Visit: Payer: Medicare HMO | Admitting: Oncology

## 2019-05-07 DIAGNOSIS — Z0100 Encounter for examination of eyes and vision without abnormal findings: Secondary | ICD-10-CM | POA: Diagnosis not present

## 2019-05-07 DIAGNOSIS — H2513 Age-related nuclear cataract, bilateral: Secondary | ICD-10-CM | POA: Diagnosis not present

## 2019-05-19 ENCOUNTER — Ambulatory Visit: Payer: Self-pay | Admitting: Adult Health

## 2019-05-27 ENCOUNTER — Ambulatory Visit: Payer: Self-pay | Admitting: Adult Health

## 2019-06-03 NOTE — Progress Notes (Signed)
MEDICARE ANNUAL WELLNESS VISIT AND FOLLOW UP Assessment:   Encounter for Medicare annual wellness exam  Essential hypertension At goal off of medications at this time Monitor blood pressure at home; call if consistently over 130/80 Continue DASH diet.   Reminder to go to the ER if any CP, SOB, nausea, dizziness, severe HA, changes vision/speech, left arm numbness and tingling and jaw pain. - CBC with Differential - CMP/GFR - TSH  ASHD Control blood pressure, cholesterol, glucose, increase exercise.   Encounter for long-term (current) use of other medications - Magnesium  Hyperlipidemia -continue medications, check lipids, decrease fatty foods, increase activity. - Lipid panel  PreDiabetes Discussed disease and risks Discussed diet/exercise, weight management  A1C, insulin level annually; monitor weight and serum glucose at routine OVs  Vitamin D deficiency At goal at recent check; continue to recommend supplementation for goal of 70-100 Defer vitamin D level  Gastroesophageal reflux disease without esophagitis Diet discussed  Cancer of sigmoid colon (HCC) Continue follow up Dr. Paulita Fujita and Dr. Ammie Dalton Due for repeat colonoscopy 08/2019 - he will call to set up   Elevated PSA Checked at CPE  Allergic rhinitis - Allegra OTC, increase H20, allergy hygiene explained.  Future Appointments  Date Time Provider [place]  09/23/2019 10:00 AM Unk Pinto, MD [facility] None  09/28/2019 11:00 AM [facility] None  09/28/2019 11:30 AM Ladell Pier, MD [facility] None      Plan:   During the course of the visit the patient was educated and counseled about appropriate screening and preventive services including:    Pneumococcal vaccine   Influenza vaccine  Td vaccine  Screening electrocardiogram  Colorectal cancer screening  Diabetes screening  Glaucoma screening  Nutrition counseling   Conditions/risks identified: BMI:  Discussed weight loss, diet, and increase physical activity.  Physical activity: AHA recommends 150 minutes of physical activity a week.  Medications reviewed Diabetes is at goal, ACE/ARB therapy: Yes. Urinary Incontinence is not an issue: discussed non pharmacology and pharmacology options.  Fall risk: low- discussed PT, home fall assessment, medications.    Subjective:  Keith [name] is a 85 y.o. male who presents for Medicare Annual Wellness Visit and 3 month follow up for HTN, hyperlipidemia, prediabetes, and vitamin D Def.   Patient was diagnosed with colon cancer in Oct 2016, a/p resection 08/02/2015, completed final cycle of chemotherapy on 02/09/2016, monitoring by serial CTs by Dr. Benay Spice, is due   He has GERD recently well controlled; hasn't needed PRN medication in several months.   BMI is Body mass index is 23.13 kg/m., he has been working on diet and exercise. Wt Readings from Last 3 Encounters:  06/04/19 161 lb 3.2 oz (73.1 kg)  02/19/19 161 lb (73 kg)  11/13/18 163 lb 12.8 oz (74.3 kg)   His blood pressure has been controlled at home, today their BP: 120/64  He does workout, walks and goes to the [facility] . He had a normal stress test 2008, EF 86%, had PTCA in 1991.  He denies chest pain, shortness of breath, dizziness.   He is on cholesterol medication (atorvastatin 20 mg daily) and denies myalgias. His cholesterol is at goal. The cholesterol last visit was:   Lab Results  Component Value Date   CHOL 153 11/13/2018   HDL 46 11/13/2018   LDLCALC 83 11/13/2018   TRIG 144 11/13/2018   CHOLHDL 3.3 11/13/2018   He has been working on diet and exercise for prediabetes, and denies paresthesia of the  feet, polydipsia and polyuria. Last A1C in the office was:  Lab Results  Component Value Date   HGBA1C 5.7 (H) 11/13/2018   Patient is on Vitamin D supplement.   Lab Results  Component Value Date   VD25OH 80 08/13/2018    He has hx of elevated PSAs checked annually at  CPE:  Lab Results  Component Value Date   PSA 2.9 08/13/2018   PSA 2.6 07/12/2017   PSA 2.7 06/12/2016  CT pelvis 08/2018 showed hypertrophy but no concerning lesions   Medication Review: Current Outpatient Medications on File Prior to Visit  Medication Sig Dispense Refill  . aspirin 81 MG tablet Take 81 mg by mouth every morning.     Marland Kitchen atorvastatin (LIPITOR) 40 MG tablet TAKE 1 TABLET DAILY FOR    CHOLESTEROL (Patient taking differently: Take 1/2 tablet daily for Cholesterol) 90 tablet 1  . Cholecalciferol (VITAMIN D PO) Take 2,000 Int'l Units by mouth every morning.     . loperamide (IMODIUM) 2 MG capsule Take 2 mg by mouth as needed for diarrhea or loose stools.    . Magnesium 250 MG TABS Take 1 tablet by mouth 2 (two) times daily.     . Omega-3 Fatty Acids (FISH OIL PO) Take 1 tablet by mouth every morning.     . ranitidine (ZANTAC) 150 MG tablet TAKE 1 TABLET DAILY FOR    INDIGESTION AND ACID REFLUX (Patient taking differently: as needed. Take 1 tablet daily for Indigestion and Acid Reflux) 90 tablet 3  . triamcinolone ointment (KENALOG) 0.1 % Apply 1 application topically 2 (two) times daily. 80 g 3   No current facility-administered medications on file prior to visit.     Current Problems (verified) Patient Active Problem List   Diagnosis Date Noted  . ASHD (arteriosclerotic heart disease) 08/13/2018  . History of rectal cancer 08/13/2018  . Non-seasonal allergic rhinitis 05/12/2018  . Colon cancer ([place]) 07/15/2015  . Prediabetes 09/14/2013  . Labile hypertension   . Hyperlipidemia, mixed   . Gastroesophageal reflux disease   . Vitamin D deficiency   . Elevated PSA     Screening Tests Immunization History  Administered Date(s) Administered  . Influenza Split 07/29/2013, 06/22/2015  . Influenza, High Dose Seasonal PF 06/15/2014, 06/12/2016  . Influenza-Unspecified 06/04/2017, 06/10/2018  . Pneumococcal Conjugate-13 06/15/2014  . Pneumococcal Polysaccharide-23  03/12/2016  . Td 02/09/2010  . Zoster 10/08/2000   Preventative care: Last colonoscopy: 08/20/2016, repeat 3 years - planned 08/2019 with Dr. Paulita Fujita PET image 06/2015  CT chest: 08/2018 CT abd/pelvis: 08/2018  Prior vaccinations: TD or Tdap: 2011  Influenza: 2019 Pneumococcal: 2004, 2017 Prevnar 13: 2015 Shingles/Zostavax: 2002   Names of Other Physician/Practitioners you currently use: 1.  [facility] here for primary care 1) Dr. Trecia Rogers, eye doctor, [phone] 2) Dr. Jone Baseman, last visit 2020 - goes q91m  Patient Care Team: Unk Pinto, MD as PCP - General (Internal Medicine) Dyke Maes, [place] (Optometry) Franchot Gallo, MD as Consulting Physician (Urology) Arsenio Loader, MD as Referring Physician (Orthopedic Surgery) Teena Irani, MD (Inactive) as Consulting Physician (Gastroenterology) Tania Ade, RN as Registered Nurse (Medical Oncology) Ladell Pier, MD as Consulting Physician (Oncology)   History reviewed: allergies, current medications, past family history, past medical history, past social history, past surgical history and problem list  Family History  Problem Relation Age of Onset  . Heart disease Mother   . Heart disease Father    Past Surgical History:  Procedure Laterality Date  . EUS N/A 06/29/2015   Procedure: LOWER ENDOSCOPIC ULTRASOUND (EUS);  Surgeon: Arta Silence, MD;  Location: [facility];  Service: Endoscopy;  Laterality: N/A;  . HERNIA REPAIR     left inguinal  . XI ROBOTIC ASSISTED LOWER ANTERIOR RESECTION N/A 08/02/2015   Procedure: XI ROBOTIC ASSISTED LOWER ANTERIOR RESECTION;  Surgeon: Stark Klein, MD;  Location: [facility];  Service: General;  Laterality: N/A;   Risk Factors: Tobacco Social History   Tobacco Use  . Smoking status: Former Smoker    Quit date: 10/04/1965    Years since quitting: 53.7  . Smokeless tobacco: Never Used  Substance Use Topics  . Alcohol use: Yes     Alcohol/week: 0.0 standard drinks    Comment: rarely  . Drug use: No   He does not smoke.  Patient is not a former smoker. Are there smokers in your home (other than you)?  No  Alcohol Current alcohol use: social drinker  Caffeine Current caffeine use: coffee 1-2 /day  Exercise Current exercise: walking  Nutrition/Diet Current diet: in general, a "healthy" diet    Cardiac risk factors: advanced age (older than 71 for men, 90 for women), hypertension and male gender.  Depression Screen (Note: if answer to either of the following is "Yes", a more complete depression screening is indicated)   Q1: Over the past two weeks, have you felt down, depressed or hopeless? No  Q2: Over the past two weeks, have you felt little interest or pleasure in doing things? No  Have you lost interest or pleasure in daily life? No  Do you often feel hopeless? No  Do you cry easily over simple problems? No  Activities of Daily Living In your present state of health, do you have any difficulty performing the following activities?:  Driving? No Managing money?  No Feeding yourself? No Getting from bed to chair? No Climbing a flight of stairs? No Preparing food and eating?: No Bathing or showering? No Getting dressed: No Getting to the toilet? No Using the toilet:No Moving around from place to place: No In the past year have you fallen or had a near fall?:No   Vision Difficulties: No  Hearing Difficulties: No Do you often ask people to speak up or repeat themselves? No Do you experience ringing or noises in your ears? No Do you have difficulty understanding soft or whispered voices? No  Cognition  Do you feel that you have a problem with memory?No  Do you often misplace items? No  Do you feel safe at home?  Yes  Advanced directives Does patient have a [place]? Yes Does patient have a Living Will? Yes  Cognitive Testing  Alert? Yes  Normal  Appearance?Yes  Oriented to person? Yes  Place? Yes   Time? Yes  Recall of three objects?  Yes  Can perform simple calculations? Yes  Displays appropriate judgment?Yes  Can read the correct time from a watch face?Yes  Objective:   Blood pressure 120/64, pulse (!) 59, temperature (!) 96.6 F (35.9 C), height 5\' 10"  (1.778 m), weight 161 lb 3.2 oz (73.1 kg), SpO2 96 %. Body mass index is 23.13 kg/m.  General appearance: alert, no distress, WD/WN, male HEENT: normocephalic, sclerae anicteric, TMs pearly, nares patent, no discharge or erythema, pharynx normal Oral cavity: MMM, no lesions Neck: supple, no lymphadenopathy, no thyromegaly, no masses Heart: RRR, normal S1, S2, no murmurs Lungs: CTA bilaterally, no wheezes, rhonchi, or rales Abdomen: +bs,  soft, non tender, non distended, no masses, no hepatomegaly, no splenomegaly Musculoskeletal: nontender, no swelling, no obvious deformity Extremities: no edema, no cyanosis, no clubbing Pulses: 2+ symmetric, upper and lower extremities, normal cap refill Neurological: alert, oriented x 3, CN2-12 intact, strength normal upper extremities and lower extremities, sensation normal throughout, DTRs 2+ throughout, no cerebellar signs, gait normal Psychiatric: normal affect, behavior normal, pleasant   Medicare Attestation I have personally reviewed: The patient's medical and social history Their use of alcohol, tobacco or illicit drugs Their current medications and supplements The patient's functional ability including ADLs,fall risks, home safety risks, cognitive, and hearing and visual impairment Diet and physical activities Evidence for depression or mood disorders  The patient's weight, height, BMI, and visual acuity have been recorded in the chart.  I have made referrals, counseling, and provided education to the patient based on review of the above and I have provided the patient with a written personalized care plan for preventive  services.     Izora Ribas, NP   06/04/2019

## 2019-06-04 ENCOUNTER — Encounter: Payer: Self-pay | Admitting: Adult Health

## 2019-06-04 ENCOUNTER — Ambulatory Visit (INDEPENDENT_AMBULATORY_CARE_PROVIDER_SITE_OTHER): Payer: Medicare HMO | Admitting: Adult Health

## 2019-06-04 ENCOUNTER — Other Ambulatory Visit: Payer: Self-pay

## 2019-06-04 VITALS — BP 120/64 | HR 59 | Temp 96.6°F | Ht 70.0 in | Wt 161.2 lb

## 2019-06-04 DIAGNOSIS — E782 Mixed hyperlipidemia: Secondary | ICD-10-CM | POA: Diagnosis not present

## 2019-06-04 DIAGNOSIS — J3089 Other allergic rhinitis: Secondary | ICD-10-CM | POA: Diagnosis not present

## 2019-06-04 DIAGNOSIS — R0989 Other specified symptoms and signs involving the circulatory and respiratory systems: Secondary | ICD-10-CM

## 2019-06-04 DIAGNOSIS — C187 Malignant neoplasm of sigmoid colon: Secondary | ICD-10-CM

## 2019-06-04 DIAGNOSIS — Z0001 Encounter for general adult medical examination with abnormal findings: Secondary | ICD-10-CM

## 2019-06-04 DIAGNOSIS — R972 Elevated prostate specific antigen [PSA]: Secondary | ICD-10-CM

## 2019-06-04 DIAGNOSIS — I2510 Atherosclerotic heart disease of native coronary artery without angina pectoris: Secondary | ICD-10-CM

## 2019-06-04 DIAGNOSIS — Z0000 Encounter for general adult medical examination without abnormal findings: Secondary | ICD-10-CM

## 2019-06-04 DIAGNOSIS — K219 Gastro-esophageal reflux disease without esophagitis: Secondary | ICD-10-CM

## 2019-06-04 DIAGNOSIS — E559 Vitamin D deficiency, unspecified: Secondary | ICD-10-CM | POA: Diagnosis not present

## 2019-06-04 DIAGNOSIS — R6889 Other general symptoms and signs: Secondary | ICD-10-CM

## 2019-06-04 DIAGNOSIS — Z6823 Body mass index (BMI) 23.0-23.9, adult: Secondary | ICD-10-CM

## 2019-06-04 DIAGNOSIS — R7303 Prediabetes: Secondary | ICD-10-CM | POA: Diagnosis not present

## 2019-06-04 DIAGNOSIS — Z85048 Personal history of other malignant neoplasm of rectum, rectosigmoid junction, and anus: Secondary | ICD-10-CM

## 2019-06-04 NOTE — Patient Instructions (Addendum)
Keith [name] , Thank you for taking time to come for your Medicare Wellness Visit. I appreciate your ongoing commitment to your health goals. Please review the following plan we discussed and let me know if I can assist you in the future.   These are the goals we discussed: Goals    . Blood Pressure < 140/80    . LDL CALC < 130       This is a list of the screening recommended for you and due dates:  Health Maintenance  Topic Date Due  . Flu Shot  05/09/2019  . Tetanus Vaccine  02/10/2020  . Pneumonia vaccines  Completed     Please reach out to Dr. Erlinda Hong office for colonoscopy that is due 08/2019  [phone]    Famotidine tablets or gelcaps What is this medicine? FAMOTIDINE (fa MOE ti deen) is a type of antihistamine that blocks the release of stomach acid. It is used to treat stomach or intestinal ulcers. It can also relieve heartburn from acid reflux. This medicine may be used for other purposes; ask your health care provider or pharmacist if you have questions. COMMON BRAND NAME(S): Heartburn Relief, Pepcid, Pepcid AC, Pepcid AC Maximum Strength What should I tell my health care provider before I take this medicine? They need to know if you have any of these conditions:  kidney or liver disease  trouble swallowing  an unusual or allergic reaction to famotidine, other medicines, foods, dyes, or preservatives  pregnant or trying to get pregnant  breast-feeding How should I use this medicine? Take this medicine by mouth with a glass of water. Follow the directions on the prescription label. If you only take this medicine once a day, take it at bedtime. Take your doses at regular intervals. Do not take your medicine more often than directed. Talk to your pediatrician regarding the use of this medicine in children. Special care may be needed. Overdosage: If you think you have taken too much of this medicine contact a poison control center or emergency room at  once. NOTE: This medicine is only for you. Do not share this medicine with others. What if I miss a dose? If you miss a dose, take it as soon as you can. If it is almost time for your next dose, take only that dose. Do not take double or extra doses. What may interact with this medicine?  delavirdine  itraconazole  ketoconazole This list may not describe all possible interactions. Give your health care provider a list of all the medicines, herbs, non-prescription drugs, or dietary supplements you use. Also tell them if you smoke, drink alcohol, or use illegal drugs. Some items may interact with your medicine. What should I watch for while using this medicine? Tell your doctor or health care professional if your condition does not start to get better or if it gets worse. Finish the full course of tablets prescribed, even if you feel better. Do not take with aspirin, ibuprofen or other antiinflammatory medicines. These can make your condition worse. Do not smoke cigarettes or drink alcohol. These cause irritation in your stomach and can increase the time it will take for ulcers to heal. If you get black, tarry stools or vomit up what looks like coffee grounds, call your doctor or health care professional at once. You may have a bleeding ulcer. This medicine may cause a decrease in vitamin B12. You should make sure that you get enough vitamin B12 while you are taking this medicine.  Discuss the foods you eat and the vitamins you take with your health care professional. What side effects may I notice from receiving this medicine? Side effects that you should report to your doctor or health care professional as soon as possible:  agitation, nervousness  confusion  hallucinations  skin rash, itching Side effects that usually do not require medical attention (report to your doctor or health care professional if they continue or are  bothersome):  constipation  diarrhea  dizziness  headache This list may not describe all possible side effects. Call your doctor for medical advice about side effects. You may report side effects to FDA at 1-800-FDA-1088. Where should I keep my medicine? Keep out of the reach of children. Store at room temperature between 15 and 30 degrees C (59 and 86 degrees F). Do not freeze. Throw away any unused medicine after the expiration date. NOTE: This sheet is a summary. It may not cover all possible information. If you have questions about this medicine, talk to your doctor, pharmacist, or health care provider.  2020 Elsevier/Gold Standard (2017-05-10 13:17:50)

## 2019-06-05 LAB — COMPLETE METABOLIC PANEL WITH GFR
AG Ratio: 1.7 (calc) (ref 1.0–2.5)
ALT: 13 U/L (ref 9–46)
AST: 19 U/L (ref 10–35)
Albumin: 4 g/dL (ref 3.6–5.1)
Alkaline phosphatase (APISO): 51 U/L (ref 35–144)
BUN: 15 mg/dL (ref 7–25)
CO2: 30 mmol/L (ref 20–32)
Calcium: 9.1 mg/dL (ref 8.6–10.3)
Chloride: 104 mmol/L (ref 98–110)
Creat: 0.73 mg/dL (ref 0.70–1.11)
GFR, Est African American: 98 mL/min/{1.73_m2} (ref >=60)
GFR, Est Non African American: 85 mL/min/{1.73_m2} (ref >=60)
Globulin: 2.3 g/dL (calc) (ref 1.9–3.7)
Glucose, Bld: 114 mg/dL — ABNORMAL HIGH (ref 65–99)
Potassium: 4.4 mmol/L (ref 3.5–5.3)
Sodium: 139 mmol/L (ref 135–146)
Total Bilirubin: 1.3 mg/dL — ABNORMAL HIGH (ref 0.2–1.2)
Total Protein: 6.3 g/dL (ref 6.1–8.1)

## 2019-06-05 LAB — CBC WITH DIFFERENTIAL/PLATELET
Absolute Monocytes: 428 cells/uL (ref 200–950)
Basophils Absolute: 31 cells/uL (ref 0–200)
Basophils Relative: 0.6 %
Eosinophils Absolute: 158 cells/uL (ref 15–500)
Eosinophils Relative: 3.1 %
HCT: 45.6 % (ref 38.5–50.0)
Hemoglobin: 15 g/dL (ref 13.2–17.1)
Lymphs Abs: 1418 cells/uL (ref 850–3900)
MCH: 29.8 pg (ref 27.0–33.0)
MCHC: 32.9 g/dL (ref 32.0–36.0)
MCV: 90.7 fL (ref 80.0–100.0)
MPV: 10.9 fL (ref 7.5–12.5)
Monocytes Relative: 8.4 %
Neutro Abs: 3065 cells/uL (ref 1500–7800)
Neutrophils Relative %: 60.1 %
Platelets: 164 10*3/uL (ref 140–400)
RBC: 5.03 10*6/uL (ref 4.20–5.80)
RDW: 13 % (ref 11.0–15.0)
Total Lymphocyte: 27.8 %
WBC: 5.1 10*3/uL (ref 3.8–10.8)

## 2019-06-05 LAB — LIPID PANEL
Cholesterol: 173 mg/dL (ref <200)
HDL: 46 mg/dL (ref >=40)
LDL Cholesterol (Calc): 102 mg/dL (calc) — ABNORMAL HIGH
Non-HDL Cholesterol (Calc): 127 mg/dL (calc) (ref <130)
Total CHOL/HDL Ratio: 3.8 (calc) (ref <5.0)
Triglycerides: 145 mg/dL (ref <150)

## 2019-06-05 LAB — MAGNESIUM: Magnesium: 1.9 mg/dL (ref 1.5–2.5)

## 2019-06-05 LAB — TSH: TSH: 1.52 mIU/L (ref 0.40–4.50)

## 2019-06-09 DIAGNOSIS — R69 Illness, unspecified: Secondary | ICD-10-CM | POA: Diagnosis not present

## 2019-07-06 DIAGNOSIS — R69 Illness, unspecified: Secondary | ICD-10-CM | POA: Diagnosis not present

## 2019-08-28 ENCOUNTER — Encounter: Payer: Self-pay | Admitting: Internal Medicine

## 2019-09-22 ENCOUNTER — Encounter: Payer: Self-pay | Admitting: Internal Medicine

## 2019-09-22 NOTE — Progress Notes (Signed)
Annual  Screening/Preventative Visit  & Comprehensive Evaluation & Examination     This very nice 85 y.o. MWM presents for a Screening /Preventative Visit & comprehensive evaluation and management of multiple medical co-morbidities.  Patient has been followed for HTN, HLD, Prediabetes and Vitamin D Deficiency. Pt's GERD vis controlled with diet & his meds.       Patient underwent Colon Ca Surg in Oct 2016, and completed ChemoTx w/Xeloda & is  followed by active surveillance with Abd CT's per  Dr Benay Spice.      Patient has hx/o labile HTN. In 1981 he underwent PTCA. In 2008, he had a Negative / Normal stress Cardiolite.  Patient's BP has been controlled at home.  Today's BP is at goal - 136/76. Patient denies any cardiac symptoms as chest pain, palpitations, shortness of breath, dizziness or ankle swelling.     Patient's hyperlipidemia is controlled with diet and his medications. Patient denies myalgias or other medication SE's. Last lipids were near goal:  Lab Results  Component Value Date   CHOL 173 06/04/2019   HDL 46 06/04/2019   LDLCALC 102 (H) 06/04/2019   TRIG 145 06/04/2019   CHOLHDL 3.8 06/04/2019      Patient has hx/o prediabetes (A1c 5.9% / 2014)  and he denies reactive hypoglycemic symptoms, visual blurring, diabetic polys or paresthesias. Last A1c was near goal:  Lab Results  Component Value Date   HGBA1C 5.7 (H) 11/13/2018       Finally, patient has history of Vitamin D Deficiency ("27" / 2009)  and last vitamin D was at goal:  Lab Results  Component Value Date   VD25OH 33 08/13/2018   Current Outpatient Medications on File Prior to Visit  Medication Sig  . aspirin 81 MG tablet Take 81 mg by mouth every morning.   Marland Kitchen atorvastatin (LIPITOR) 40 MG tablet TAKE 1 TABLET DAILY FOR    CHOLESTEROL (Patient taking differently: Take 1/2 tablet daily for Cholesterol)  . Cholecalciferol (VITAMIN D PO) Take 2,000 Int'l Units by mouth every morning.   . loperamide (IMODIUM) 2  MG capsule Take 2 mg by mouth as needed for diarrhea or loose stools.  . Magnesium 250 MG TABS Take 1 tablet by mouth 2 (two) times daily.   . Omega-3 Fatty Acids (FISH OIL PO) Take 1 tablet by mouth every morning.   . triamcinolone ointment (KENALOG) 0.1 % Apply 1 application topically 2 (two) times daily.   No current facility-administered medications on file prior to visit.   Allergies  Allergen Reactions  . Penicillins    Past Medical History:  Diagnosis Date  . Cancer ([facility])    cancer of recto-sigmoid junction-dx 07/2015  . Elevated PSA   . GERD (gastroesophageal reflux disease)   . Hemorrhoids   . Hyperlipidemia   . Hypertension   . Pneumonia    age 45  . Vitamin D deficiency    Health Maintenance  Topic Date Due  . TETANUS/TDAP  02/10/2020  . INFLUENZA VACCINE  Completed  . PNA vac Low Risk Adult  Completed   Immunization History  Administered Date(s) Administered  . Fluad Quad(high Dose 65+) 06/09/2019  . Influenza Split 07/29/2013, 06/22/2015  . Influenza, High Dose Seasonal PF 06/15/2014, 06/12/2016  . Influenza-Unspecified 06/04/2017, 06/10/2018  . Pneumococcal Conjugate-13 06/15/2014  . Pneumococcal Polysaccharide-23 03/12/2016  . Td 02/09/2010  . Zoster 10/08/2000   Last Colon -  04/19/2016 - Dr Paulita Fujita - Colon s/p partial colectomy recc 3 yr f/u  Due July 2020           -  06/29/2015 Flex Sig (+) Rectal Ca after (+) Cologard                    -  08/02/2015 - Rob. Partial colectomy - Dr Barry Dienes  Past Surgical History:  Procedure Laterality Date  . EUS N/A 06/29/2015   Procedure: LOWER ENDOSCOPIC ULTRASOUND (EUS);  Surgeon: Arta Silence, MD;  Location: [facility];  Service: Endoscopy;  Laterality: N/A;  . HERNIA REPAIR     left inguinal  . XI ROBOTIC ASSISTED LOWER ANTERIOR RESECTION N/A 08/02/2015   Procedure: XI ROBOTIC ASSISTED LOWER ANTERIOR RESECTION;  Surgeon: Stark Klein, MD;  Location: [facility];  Service: General;  Laterality: N/A;   Family  History  Problem Relation Age of Onset  . Heart disease Mother   . Heart disease Father    Social History   Socioeconomic History  . Marital status: Married    Spouse name: Pamala Hurry  . Number of children: 3 Children / 5 GrC  Tobacco Use  . Smoking status: Former Smoker    Quit date: 10/04/1965    Years since quitting: 54.0  . Smokeless tobacco: Never Used  Substance and Sexual Activity  . Alcohol use: Yes    Alcohol/week: 0.0 standard drinks    Comment: rarely  . Drug use: No  Social History Narrative   Married, wife Pamala Hurry   Retired    ROS Constitutional: Denies fever, chills, weight loss/gain, headaches, insomnia,  night sweats or change in appetite. Does c/o fatigue. Eyes: Denies redness, blurred vision, diplopia, discharge, itchy or watery eyes.  ENT: Denies discharge, congestion, post nasal drip, epistaxis, sore throat, earache, hearing loss, dental pain, Tinnitus, Vertigo, Sinus pain or snoring.  Cardio: Denies chest pain, palpitations, irregular heartbeat, syncope, dyspnea, diaphoresis, orthopnea, PND, claudication or edema Respiratory: denies cough, dyspnea, DOE, pleurisy, hoarseness, laryngitis or wheezing.  Gastrointestinal: Denies dysphagia, heartburn, reflux, water brash, pain, cramps, nausea, vomiting, bloating, diarrhea, constipation, hematemesis, melena, hematochezia, jaundice or hemorrhoids Genitourinary: Denies dysuria, frequency, urgency, nocturia, hesitancy, discharge, hematuria or flank pain Musculoskeletal: Denies arthralgia, myalgia, stiffness, Jt. Swelling, pain, limp or strain/sprain. Denies Falls. Skin: Denies puritis, rash, hives, warts, acne, eczema or change in skin lesion Neuro: No weakness, tremor, incoordination, spasms, paresthesia or pain Psychiatric: Denies confusion, memory loss or sensory loss. Denies Depression. Endocrine: Denies change in weight, skin, hair change, nocturia, and paresthesia, diabetic polys, visual blurring or hyper / hypo  glycemic episodes.  Heme/Lymph: No excessive bleeding, bruising or enlarged lymph nodes.  Physical Exam  BP 136/76   Pulse 60   Temp (!) 97.2 F (36.2 C)   Resp 16   Ht 5\' 10"  (1.778 m)   Wt 159 lb 12.8 oz (72.5 kg)   BMI 22.93 kg/m   General Appearance: Well nourished and well groomed and in no apparent distress.  Eyes: PERRLA, EOMs, conjunctiva no swelling or erythema, normal fundi and vessels. Sinuses: No frontal/maxillary tenderness ENT/Mouth: EACs patent / TMs  nl. Nares clear without erythema, swelling, mucoid exudates. Oral hygiene is good. No erythema, swelling, or exudate. Tongue normal, non-obstructing. Tonsils not swollen or erythematous. Hearing normal.  Neck: Supple, thyroid not palpable. No bruits, nodes or JVD. Respiratory: Respiratory effort normal.  BS equal and clear bilateral without rales, rhonci, wheezing or stridor. Cardio: Heart sounds are normal with regular rate and rhythm and no murmurs, rubs or gallops. Peripheral pulses are normal and equal bilaterally without  edema. No aortic or femoral bruits. Chest: symmetric with normal excursions and percussion.  Abdomen: Soft, with Nl bowel sounds. Nontender, no guarding, rebound, hernias, masses, or organomegaly.  Lymphatics: Non tender without lymphadenopathy.  Musculoskeletal: Full ROM all peripheral extremities, joint stability, 5/5 strength, and normal gait. Skin: Warm and dry without rashes, lesions, cyanosis, clubbing or  ecchymosis.  Neuro: Cranial nerves intact, reflexes equal bilaterally. Normal muscle tone, no cerebellar symptoms. Sensation intact.  Pysch: Alert and oriented X 3 with normal affect, insight and judgment appropriate.   Assessment and Plan  1. Annual Preventative/Screening Exam   2. Labile hypertension  - [place], retroperitnl abd,  ltd - Urinalysis, Routine w reflex microscopic - Microalbumin / Creatinine Urine Ratio - CBC with Diff - COMPLETE METABOLIC PANEL WITH GFR - Magnesium -  TSH - EKG 12-Lead  3. Hyperlipidemia, mixed  - [place], retroperitnl abd,  ltd - Lipid Profile - TSH - EKG 12-Lead  4. Abnormal glucose  - [place], retroperitnl abd,  ltd - Hemoglobin A1c ([facility]) - Insulin, random - EKG 12-Lead  5. Vitamin D deficiency  - Vitamin D (25 hydroxy)  6. ASHD (arteriosclerotic heart disease)  - Lipid Profile - EKG 12-Lead  7. Prediabetes  - Hemoglobin A1c ([facility]) - Insulin, random - EKG 12-Lead  8. BPH with obstruction/lower urinary tract symptoms  - PSA  9. Prostate cancer screening  - PSA  10. Screening for colorectal cancer  - POC Hemoccult Bld/Stl  11. Screening for ischemic heart disease  - EKG 12-Lead  12. FHx: heart disease  - [place], retroperitnl abd,  ltd - EKG 12-Lead  13. Former smoker  - [place], retroperitnl abd,  ltd - EKG 12-Lead  14. Screening for AAA (aortic abdominal aneurysm)  - [place], retroperitnl abd,  ltd  15. Medication management  - Urinalysis, Routine w reflex microscopic - Microalbumin / Creatinine Urine Ratio - CBC with Diff - COMPLETE METABOLIC PANEL WITH GFR - Magnesium - Lipid Profile - TSH - Hemoglobin A1c ([facility]) - Insulin, random - Vitamin D (25 hydroxy)        Patient was counseled in prudent diet, weight control to achieve/maintain BMI less than 25, BP monitoring, regular exercise and medications as discussed.  Discussed med effects and SE's. Routine screening labs and tests as requested with regular follow-up as recommended. Over 40 minutes of exam, counseling, chart review and high complex critical decision making was performed   Kirtland Bouchard, MD

## 2019-09-22 NOTE — Patient Instructions (Signed)

## 2019-09-23 ENCOUNTER — Other Ambulatory Visit: Payer: Self-pay

## 2019-09-23 ENCOUNTER — Ambulatory Visit (INDEPENDENT_AMBULATORY_CARE_PROVIDER_SITE_OTHER): Payer: Medicare HMO | Admitting: Internal Medicine

## 2019-09-23 VITALS — BP 136/76 | HR 60 | Temp 97.2°F | Resp 16 | Ht 70.0 in | Wt 159.8 lb

## 2019-09-23 DIAGNOSIS — E559 Vitamin D deficiency, unspecified: Secondary | ICD-10-CM | POA: Diagnosis not present

## 2019-09-23 DIAGNOSIS — R7309 Other abnormal glucose: Secondary | ICD-10-CM | POA: Diagnosis not present

## 2019-09-23 DIAGNOSIS — Z79899 Other long term (current) drug therapy: Secondary | ICD-10-CM

## 2019-09-23 DIAGNOSIS — Z0000 Encounter for general adult medical examination without abnormal findings: Secondary | ICD-10-CM

## 2019-09-23 DIAGNOSIS — R0989 Other specified symptoms and signs involving the circulatory and respiratory systems: Secondary | ICD-10-CM

## 2019-09-23 DIAGNOSIS — Z87891 Personal history of nicotine dependence: Secondary | ICD-10-CM

## 2019-09-23 DIAGNOSIS — N401 Enlarged prostate with lower urinary tract symptoms: Secondary | ICD-10-CM | POA: Diagnosis not present

## 2019-09-23 DIAGNOSIS — E782 Mixed hyperlipidemia: Secondary | ICD-10-CM

## 2019-09-23 DIAGNOSIS — Z8249 Family history of ischemic heart disease and other diseases of the circulatory system: Secondary | ICD-10-CM

## 2019-09-23 DIAGNOSIS — Z125 Encounter for screening for malignant neoplasm of prostate: Secondary | ICD-10-CM

## 2019-09-23 DIAGNOSIS — Z0001 Encounter for general adult medical examination with abnormal findings: Secondary | ICD-10-CM

## 2019-09-23 DIAGNOSIS — I2510 Atherosclerotic heart disease of native coronary artery without angina pectoris: Secondary | ICD-10-CM

## 2019-09-23 DIAGNOSIS — N138 Other obstructive and reflux uropathy: Secondary | ICD-10-CM

## 2019-09-23 DIAGNOSIS — R7303 Prediabetes: Secondary | ICD-10-CM | POA: Diagnosis not present

## 2019-09-23 DIAGNOSIS — Z1211 Encounter for screening for malignant neoplasm of colon: Secondary | ICD-10-CM

## 2019-09-23 DIAGNOSIS — Z136 Encounter for screening for cardiovascular disorders: Secondary | ICD-10-CM | POA: Diagnosis not present

## 2019-09-24 LAB — LIPID PANEL
Cholesterol: 160 mg/dL (ref <200)
HDL: 46 mg/dL (ref >=40)
LDL Cholesterol (Calc): 83 mg/dL (calc)
Non-HDL Cholesterol (Calc): 114 mg/dL (calc) (ref <130)
Total CHOL/HDL Ratio: 3.5 (calc) (ref <5.0)
Triglycerides: 215 mg/dL — ABNORMAL HIGH (ref <150)

## 2019-09-24 LAB — URINALYSIS, ROUTINE W REFLEX MICROSCOPIC
Bilirubin Urine: NEGATIVE
Glucose, UA: NEGATIVE
Hgb urine dipstick: NEGATIVE
Ketones, ur: NEGATIVE
Leukocytes,Ua: NEGATIVE
Nitrite: NEGATIVE
Protein, ur: NEGATIVE
Specific Gravity, Urine: 1.011 (ref 1.001–1.03)
pH: 7 (ref 5.0–8.0)

## 2019-09-24 LAB — COMPLETE METABOLIC PANEL WITH GFR
AG Ratio: 1.6 (calc) (ref 1.0–2.5)
ALT: 14 U/L (ref 9–46)
AST: 18 U/L (ref 10–35)
Albumin: 4.1 g/dL (ref 3.6–5.1)
Alkaline phosphatase (APISO): 60 U/L (ref 35–144)
BUN: 15 mg/dL (ref 7–25)
CO2: 29 mmol/L (ref 20–32)
Calcium: 9.6 mg/dL (ref 8.6–10.3)
Chloride: 103 mmol/L (ref 98–110)
Creat: 0.93 mg/dL (ref 0.70–1.11)
GFR, Est African American: 86 mL/min/{1.73_m2} (ref >=60)
GFR, Est Non African American: 75 mL/min/{1.73_m2} (ref >=60)
Globulin: 2.6 g/dL (calc) (ref 1.9–3.7)
Glucose, Bld: 88 mg/dL (ref 65–99)
Potassium: 4.5 mmol/L (ref 3.5–5.3)
Sodium: 140 mmol/L (ref 135–146)
Total Bilirubin: 1.3 mg/dL — ABNORMAL HIGH (ref 0.2–1.2)
Total Protein: 6.7 g/dL (ref 6.1–8.1)

## 2019-09-24 LAB — CBC WITH DIFFERENTIAL/PLATELET
Absolute Monocytes: 479 cells/uL (ref 200–950)
Basophils Absolute: 23 cells/uL (ref 0–200)
Basophils Relative: 0.4 %
Eosinophils Absolute: 211 cells/uL (ref 15–500)
Eosinophils Relative: 3.7 %
HCT: 46.2 % (ref 38.5–50.0)
Hemoglobin: 15.2 g/dL (ref 13.2–17.1)
Lymphs Abs: 1619 cells/uL (ref 850–3900)
MCH: 30.2 pg (ref 27.0–33.0)
MCHC: 32.9 g/dL (ref 32.0–36.0)
MCV: 91.8 fL (ref 80.0–100.0)
MPV: 11 fL (ref 7.5–12.5)
Monocytes Relative: 8.4 %
Neutro Abs: 3369 cells/uL (ref 1500–7800)
Neutrophils Relative %: 59.1 %
Platelets: 174 10*3/uL (ref 140–400)
RBC: 5.03 10*6/uL (ref 4.20–5.80)
RDW: 12.5 % (ref 11.0–15.0)
Total Lymphocyte: 28.4 %
WBC: 5.7 10*3/uL (ref 3.8–10.8)

## 2019-09-24 LAB — MAGNESIUM: Magnesium: 2.1 mg/dL (ref 1.5–2.5)

## 2019-09-24 LAB — HEMOGLOBIN A1C
Hgb A1c MFr Bld: 5.6 % of total Hgb (ref <5.7)
Mean Plasma Glucose: 114 (calc)
eAG (mmol/L): 6.3 (calc)

## 2019-09-24 LAB — MICROALBUMIN / CREATININE URINE RATIO
Creatinine, Urine: 60 mg/dL (ref 20–320)
Microalb Creat Ratio: 3 mcg/mg creat (ref <30)
Microalb, Ur: 0.2 mg/dL

## 2019-09-24 LAB — TSH: TSH: 1.63 mIU/L (ref 0.40–4.50)

## 2019-09-24 LAB — INSULIN, RANDOM: Insulin: 23.7 u[IU]/mL — ABNORMAL HIGH

## 2019-09-24 LAB — PSA: PSA: 3 ng/mL (ref <=4.0)

## 2019-09-24 LAB — VITAMIN D 25 HYDROXY (VIT D DEFICIENCY, FRACTURES): Vit D, 25-Hydroxy: 49 ng/mL (ref 30–100)

## 2019-09-27 ENCOUNTER — Encounter: Payer: Self-pay | Admitting: Internal Medicine

## 2019-09-28 ENCOUNTER — Inpatient Hospital Stay: Payer: Medicare HMO

## 2019-09-28 ENCOUNTER — Telehealth: Payer: Self-pay

## 2019-09-28 ENCOUNTER — Other Ambulatory Visit: Payer: Self-pay

## 2019-09-28 ENCOUNTER — Inpatient Hospital Stay: Payer: Medicare HMO | Attending: Oncology | Admitting: Oncology

## 2019-09-28 VITALS — BP 136/62 | HR 57 | Temp 97.8°F | Resp 16 | Ht 70.0 in | Wt 161.2 lb

## 2019-09-28 DIAGNOSIS — Z85038 Personal history of other malignant neoplasm of large intestine: Secondary | ICD-10-CM | POA: Insufficient documentation

## 2019-09-28 DIAGNOSIS — I2510 Atherosclerotic heart disease of native coronary artery without angina pectoris: Secondary | ICD-10-CM | POA: Diagnosis not present

## 2019-09-28 DIAGNOSIS — C187 Malignant neoplasm of sigmoid colon: Secondary | ICD-10-CM

## 2019-09-28 DIAGNOSIS — Z9221 Personal history of antineoplastic chemotherapy: Secondary | ICD-10-CM | POA: Insufficient documentation

## 2019-09-28 LAB — CEA (IN HOUSE-CHCC): CEA (CHCC-In House): 1.19 ng/mL (ref 0.00–5.00)

## 2019-09-28 NOTE — Telephone Encounter (Signed)
TC to pt per Dr Learta Codding to let him know  CEA is normal. Patient verbalized understanding. No further problems or concerns at this time.

## 2019-09-28 NOTE — Progress Notes (Signed)
  [facility] OFFICE PROGRESS NOTE   Diagnosis: Colon cancer  INTERVAL HISTORY:   Keith [name] returns as scheduled.  He feels well.  He reports having frequent bowel movements since the time of colon surgery.  Good appetite.  No complaint.  Objective:  Vital signs in last 24 hours:  Blood pressure 136/62, pulse (!) 57, temperature 97.8 F (36.6 C), temperature source Temporal, resp. rate 16, height _0  (1.778 m), weight 161 lb 3.2 oz (73.1 kg), SpO2 99 %.    Limited physical examination secondary to distancing with the Covid pandemic Lymphatics: No cervical, supraclavicular, axillary, or inguinal nodes  GI: No hepatosplenomegaly, no mass, nontender Vascular: No leg edema     Lab Results:  Lab Results  Component Value Date   WBC 5.7 09/23/2019   HGB 15.2 09/23/2019   HCT 46.2 09/23/2019   MCV 91.8 09/23/2019   PLT 174 09/23/2019   NEUTROABS 3,369 09/23/2019    CMP  Lab Results  Component Value Date   NA 140 09/23/2019   K 4.5 09/23/2019   CL 103 09/23/2019   CO2 29 09/23/2019   GLUCOSE 88 09/23/2019   BUN 15 09/23/2019   CREATININE 0.93 09/23/2019   CALCIUM 9.6 09/23/2019   PROT 6.7 09/23/2019   ALBUMIN 3.9 12/13/2016   AST 18 09/23/2019   ALT 14 09/23/2019   ALKPHOS 57 12/13/2016   BILITOT 1.3 (H) 09/23/2019   GFRNONAA 75 09/23/2019   GFRAA 86 09/23/2019    Lab Results  Component Value Date   CEA1 1.19 09/28/2019    Medications: I have reviewed the patient's current medications.   Assessment/Plan: 1. Adenocarcinoma at 20 cm from the anal verge, most consistent with sigmoid colon cancer ? Staging PET scan 07/05/2015 with no evidence of metastatic disease ? Endoscopic ultrasound 06/29/2015 consistent with a T2N0 tumor ? Status post a robotic low anterior resection 08/02/2015 with tumor confirmed above the peritoneal reflection, pT2 vs pT3,N1b, focally positive circumferential margin, no loss of mismatch repair protein  expression ? Cycle 1 adjuvant Xeloda 09/02/2015 ? Cycle 2 adjuvant Xeloda 09/23/2015 ? Cycle 3 adjuvant Xeloda 10/14/2015; dose reduced to 1000 mg in the morning and 500 mg in the evening for 14 days followed by 7 days off (skin rash, hand-foot syndrome) ? Cycle 4 adjuvant Xeloda 11/04/2015 ? Cycle 5 adjuvant Xeloda 11/25/2015 ? Cycle 6 adjuvant Xeloda 12/16/2015 ? Cycle 7 adjuvant Xeloda 01/06/2016 ? Cycle 8 adjuvant Xeloda 01/27/2016 ? Colonoscopy 08/20/2016-few diverticula in the rectosigmoid colon. Repeat colonoscopy in 3 years for surveillance. ? CTs chest, abdomen, and pelvis 09/12/2016-negative for metastatic disease, stable right upper lobe nodule ? CTs 09/11/2017- resolution of a previously noted right upper lobe nodule, no evidence of metastatic disease ? CTs 09/12/2018- no evidence of metastatic disease  2. History of coronary artery disease  3. History ofMild hyperbilirubinemia -chronic, [place]?     Disposition: Keith [name] is in clinical remission from colon cancer.  He is now greater than 4 years out from diagnosis.  We will follow up on the CEA from today.  Office visit and CEA in 1 year.  He is scheduled to see Dr. Paulita Fujita tomorrow to discuss the indication for a surveillance colonoscopy.  [name], MD  09/28/2019  12:38 PM

## 2019-09-29 DIAGNOSIS — C189 Malignant neoplasm of colon, unspecified: Secondary | ICD-10-CM | POA: Diagnosis not present

## 2019-09-30 ENCOUNTER — Telehealth: Payer: Self-pay | Admitting: Oncology

## 2019-09-30 NOTE — Telephone Encounter (Signed)
Scheduled per los. Called and spoke with patient. Confirmed appt 

## 2019-10-05 DIAGNOSIS — Z20828 Contact with and (suspected) exposure to other viral communicable diseases: Secondary | ICD-10-CM | POA: Diagnosis not present

## 2019-10-07 ENCOUNTER — Other Ambulatory Visit: Payer: Self-pay | Admitting: Gastroenterology

## 2019-10-07 DIAGNOSIS — C182 Malignant neoplasm of ascending colon: Secondary | ICD-10-CM

## 2019-10-09 HISTORY — PX: CATARACT EXTRACTION, BILATERAL: SHX1313

## 2019-10-12 ENCOUNTER — Encounter: Payer: Self-pay | Admitting: Internal Medicine

## 2019-10-12 ENCOUNTER — Ambulatory Visit (INDEPENDENT_AMBULATORY_CARE_PROVIDER_SITE_OTHER): Payer: Medicare HMO | Admitting: Internal Medicine

## 2019-10-12 ENCOUNTER — Other Ambulatory Visit: Payer: Self-pay

## 2019-10-12 VITALS — BP 136/66 | HR 56 | Temp 97.0°F | Resp 16 | Ht 70.0 in | Wt 161.2 lb

## 2019-10-12 DIAGNOSIS — B029 Zoster without complications: Secondary | ICD-10-CM | POA: Diagnosis not present

## 2019-10-12 MED ORDER — DEXAMETHASONE 4 MG PO TABS: ORAL_TABLET | ORAL | 0 refills | Status: DC

## 2019-10-12 MED ORDER — DEXAMETHASONE 0.5 MG PO TABS: ORAL_TABLET | ORAL | 0 refills | Status: DC

## 2019-10-12 MED ORDER — GABAPENTIN 100 MG PO CAPS: ORAL_CAPSULE | ORAL | 1 refills | Status: DC

## 2019-10-12 NOTE — Patient Instructions (Signed)
Shingles  Shingles, which is also known as herpes zoster, is an infection that causes a painful skin rash and fluid-filled blisters. It is caused by a virus. Shingles only develops in people who:  Have had chickenpox.  Have been given a medicine to protect against chickenpox (have been vaccinated). Shingles is rare in this group. What are the causes? Shingles is caused by varicella-zoster virus (VZV). This is the same virus that causes chickenpox. After a person is exposed to VZV, the virus stays in the body in an inactive (dormant) state. Shingles develops if the virus is reactivated. This can happen many years after the first (initial) exposure to VZV. It is not known what causes this virus to be reactivated. What increases the risk? People who have had chickenpox or received the chickenpox vaccine are at risk for shingles. Shingles infection is more common in people who:  Are older than age 60.  Have a weakened disease-fighting system (immune system), such as people with: ? HIV. ? AIDS. ? Cancer.  Are taking medicines that weaken the immune system, such as transplant medicines.  Are experiencing a lot of stress. What are the signs or symptoms? Early symptoms of this condition include itching, tingling, and pain in an area on your skin. Pain may be described as burning, stabbing, or throbbing. A few days or weeks after early symptoms start, a painful red rash appears. The rash is usually on one side of the body and has a band-like or belt-like pattern. The rash eventually turns into fluid-filled blisters that break open, change into scabs, and dry up in about 2-3 weeks. At any time during the infection, you may also develop:  A fever.  Chills.  A headache.  An upset stomach. How is this diagnosed? This condition is diagnosed with a skin exam. Skin or fluid samples may be taken from the blisters before a diagnosis is made. These samples are examined under a microscope or sent to  a lab for testing. How is this treated? The rash may last for several weeks. There is not a specific cure for this condition. Your health care provider will probably prescribe medicines to help you manage pain, recover more quickly, and avoid long-term problems. Medicines may include:  Antiviral drugs.  Anti-inflammatory drugs.  Pain medicines.  Anti-itching medicines (antihistamines). If the area involved is on your face, you may be referred to a specialist, such as an eye doctor (ophthalmologist) or an ear, nose, and throat (ENT) doctor (otolaryngologist) to help you avoid eye problems, chronic pain, or disability. Follow these instructions at home: Medicines  Take over-the-counter and prescription medicines only as told by your health care provider.  Apply an anti-itch cream or numbing cream to the affected area as told by your health care provider. Relieving itching and discomfort   Apply cold, wet cloths (cold compresses) to the area of the rash or blisters as told by your health care provider.  Cool baths can be soothing. Try adding baking soda or dry oatmeal to the water to reduce itching. Do not bathe in hot water. Blister and rash care  Keep your rash covered with a loose bandage (dressing). Wear loose-fitting clothing to help ease the pain of material rubbing against the rash.  Keep your rash and blisters clean by washing the area with mild soap and cool water as told by your health care provider.  Check your rash every day for signs of infection. Check for: ? More redness, swelling, or pain. ? Fluid   or blood. ? Warmth. ? Pus or a bad smell.  Do not scratch your rash or pick at your blisters. To help avoid scratching: ? Keep your fingernails clean and cut short. ? Wear gloves or mittens while you sleep, if scratching is a problem. General instructions  Rest as told by your health care provider.  Keep all follow-up visits as told by your health care provider. This  is important.  Wash your hands often with soap and water. If soap and water are not available, use hand sanitizer. Doing this lowers your chance of getting a bacterial skin infection.  Before your blisters change into scabs, your shingles infection can cause chickenpox in people who have never had it or have never been vaccinated against it. To prevent this from happening, avoid contact with other people, especially: ? Babies. ? Pregnant women. ? Children who have eczema. ? Elderly people who have transplants. ? People who have chronic illnesses, such as cancer or AIDS. Contact a health care provider if:  Your pain is not relieved with prescribed medicines.  Your pain does not get better after the rash heals.  You have signs of infection in the rash area, such as: ? More redness, swelling, or pain around the rash. ? Fluid or blood coming from the rash. ? The rash area feeling warm to the touch. ? Pus or a bad smell coming from the rash. Get help right away if:  The rash is on your face or nose.  You have facial pain, pain around your eye area, or loss of feeling on one side of your face.  You have difficulty seeing.  You have ear pain or have ringing in your ear.  You have a loss of taste.  Your condition gets worse. Summary  Shingles, which is also known as herpes zoster, is an infection that causes a painful skin rash and fluid-filled blisters.  This condition is diagnosed with a skin exam. Skin or fluid samples may be taken from the blisters and examined before the diagnosis is made.  Keep your rash covered with a loose bandage (dressing). Wear loose-fitting clothing to help ease the pain of material rubbing against the rash.  Before your blisters change into scabs, your shingles infection can cause chickenpox in people who have never had it or have never been vaccinated against it. This information is not intended to replace advice given to you by your health care  provider. Make sure you discuss any questions you have with your health care provider. Document Revised: 01/16/2019 Document Reviewed: 05/29/2017 Elsevier Patient Education  2020 [employer] Inc[employer]  

## 2019-10-12 NOTE — Progress Notes (Signed)
   History of Present Illness:     Patient is a nice 86 yo MWM with cryosurgery tx 3 weeks ago of an irritated seborrheic  keratosis on his left mid back on Dec 16 th. He presents today with a 3-4 day hx of a burning discomfort & rash around the area of the cryo Tx.  He rates the severity of the pain at 4-5/10.   Medications  .  atorvastatin (LIPITOR) 40 MG tablet, TAKE 1 TABLET DAILY FOR    CHOLESTEROL (Patient taking differently: Take 1/2 tablet daily for Cholesterol) .  aspirin 81 MG tablet, Take 81 mg by mouth every morning.   .  Cholecalciferol (VITAMIN D PO), Take 5,000 Int'l Units by mouth every morning.  .  loperamide (IMODIUM) 2 MG capsule, Take 2 mg by mouth as needed for diarrhea or loose stools. .  Magnesium 250 MG TABS, Take 1 tablet by mouth 2 (two) times daily.  .  Omega-3 Fatty Acids (FISH OIL PO), Take 1 tablet by mouth every morning.  .  triamcinolone ointment (KENALOG) 0.1 %, Apply 1 application topically 2 (two) times daily.   Problem list He has Labile hypertension; Hyperlipidemia, mixed; Gastroesophageal reflux disease; Vitamin D deficiency; Elevated PSA; Prediabetes; Colon cancer ([facility]); Non-seasonal allergic rhinitis; ASHD (arteriosclerotic heart disease); and History of rectal cancer on their problem list.   Observations/Objective:   BP 136/66   Pulse (!) 56   Temp (!) 97 F (36.1 C)   Resp 16   Ht 5\' 10"  (1.778 m)   Wt 161 lb 3.2 oz (73.1 kg)   BMI 23.13 kg/m   HEENT - WNL. Neck - supple.  Chest - Clear equal BS. Cor - Nl HS. RRR w/o sig M. No edema. MS- FROM w/o deformities.  Gait Nl. Skin - #-4 " diameter red raised "bumpy" rash over the Left T4-5 dermatome of the mid Left back. No signs of cellulitis.  Neuro -  Nl w/o focal abnormalities.  Assessment and Plan:  1. Herpes zoster, suspected   - CBC with Diff - Varicella zoster antibody, IgM - Varicella zoster antibody, IgG  - gabapentin (NEURONTIN) 100 MG capsule; Take 1 capsule 3  x /day for  pain  Dispense: 30 capsule; Refill: 1  - dexamethasone (DECADRON) 4 MG tablet; Take 1 tab 3 x day - 3 days, then 2 x day - 3 days, then 1 tab daily  Dispense: 30 tablet; Refill: 0      I discussed the assessment and treatment plan with the patient. The patient was provided an opportunity to ask questions and all were answered. The patient agreed with the plan and demonstrated an understanding of the instructions. The patient was advised to call back or seek an in-person evaluation if the symptoms worsen or if the condition fails to improve as anticipated.    Kirtland Bouchard, MD

## 2019-10-15 LAB — CBC WITH DIFFERENTIAL/PLATELET
Absolute Monocytes: 558 cells/uL (ref 200–950)
Basophils Absolute: 30 cells/uL (ref 0–200)
Basophils Relative: 0.5 %
Eosinophils Absolute: 252 cells/uL (ref 15–500)
Eosinophils Relative: 4.2 %
HCT: 45.3 % (ref 38.5–50.0)
Hemoglobin: 15 g/dL (ref 13.2–17.1)
Lymphs Abs: 1668 cells/uL (ref 850–3900)
MCH: 29.8 pg (ref 27.0–33.0)
MCHC: 33.1 g/dL (ref 32.0–36.0)
MCV: 90.1 fL (ref 80.0–100.0)
MPV: 10.8 fL (ref 7.5–12.5)
Monocytes Relative: 9.3 %
Neutro Abs: 3492 cells/uL (ref 1500–7800)
Neutrophils Relative %: 58.2 %
Platelets: 159 10*3/uL (ref 140–400)
RBC: 5.03 10*6/uL (ref 4.20–5.80)
RDW: 12.2 % (ref 11.0–15.0)
Total Lymphocyte: 27.8 %
WBC: 6 10*3/uL (ref 3.8–10.8)

## 2019-10-15 LAB — VARICELLA ZOSTER ANTIBODY, IGM: Varicella Zoster Ab IgM: <=0.9 (ref <=0.90)

## 2019-10-15 LAB — VARICELLA ZOSTER ANTIBODY, IGG: Varicella IgG: 2733 index

## 2019-10-23 ENCOUNTER — Other Ambulatory Visit: Payer: Self-pay

## 2019-10-23 ENCOUNTER — Ambulatory Visit
Admission: RE | Admit: 2019-10-23 | Discharge: 2019-10-23 | Disposition: A | Discharge status: Home / Self Care | Payer: Medicare HMO | Source: Ambulatory Visit | Attending: Gastroenterology | Admitting: Gastroenterology

## 2019-10-23 DIAGNOSIS — C187 Malignant neoplasm of sigmoid colon: Secondary | ICD-10-CM | POA: Diagnosis not present

## 2019-10-23 DIAGNOSIS — C182 Malignant neoplasm of ascending colon: Secondary | ICD-10-CM

## 2019-11-13 DIAGNOSIS — E785 Hyperlipidemia, unspecified: Secondary | ICD-10-CM | POA: Diagnosis not present

## 2019-11-13 DIAGNOSIS — Z008 Encounter for other general examination: Secondary | ICD-10-CM | POA: Diagnosis not present

## 2019-11-13 DIAGNOSIS — Z85038 Personal history of other malignant neoplasm of large intestine: Secondary | ICD-10-CM | POA: Diagnosis not present

## 2019-11-13 DIAGNOSIS — Z880 Allergy status to penicillin: Secondary | ICD-10-CM | POA: Diagnosis not present

## 2019-11-13 DIAGNOSIS — Z809 Family history of malignant neoplasm, unspecified: Secondary | ICD-10-CM | POA: Diagnosis not present

## 2019-11-13 DIAGNOSIS — R197 Diarrhea, unspecified: Secondary | ICD-10-CM | POA: Diagnosis not present

## 2019-11-13 DIAGNOSIS — R030 Elevated blood-pressure reading, without diagnosis of hypertension: Secondary | ICD-10-CM | POA: Diagnosis not present

## 2019-11-13 DIAGNOSIS — Z7982 Long term (current) use of aspirin: Secondary | ICD-10-CM | POA: Diagnosis not present

## 2019-12-23 DIAGNOSIS — I712 Thoracic aortic aneurysm, without rupture, unspecified: Secondary | ICD-10-CM | POA: Insufficient documentation

## 2019-12-23 DIAGNOSIS — I700 Atherosclerosis of aorta: Secondary | ICD-10-CM | POA: Insufficient documentation

## 2019-12-23 DIAGNOSIS — I719 Aortic aneurysm of unspecified site, without rupture: Secondary | ICD-10-CM | POA: Insufficient documentation

## 2019-12-23 NOTE — Progress Notes (Signed)
MEDICARE ANNUAL WELLNESS VISIT AND FOLLOW UP Assessment:   Encounter for Medicare annual wellness exam  Atherosclerosis of aorta Per numerous CTs Control blood pressure, cholesterol, glucose, increase exercise.   Essential hypertension At goal off of medications at this time Monitor blood pressure at home; call if consistently over 130/80 Continue DASH diet.   Reminder to go to the ER if any CP, SOB, nausea, dizziness, severe HA, changes vision/speech, left arm numbness and tingling and jaw pain. - CBC with Differential - CMP/GFR - TSH  ASHD Control blood pressure, cholesterol, glucose, increase exercise.   Encounter for long-term (current) use of other medications - Magnesium  Hyperlipidemia -has been stable at goal, continue medications, check lipids, decrease fatty foods, increase activity.  PreDiabetes Discussed disease and risks Discussed diet/exercise, weight management  A1C, insulin level annually; monitor weight and serum glucose at routine OVs  Vitamin D deficiency At goal at recent check; continue to recommend supplementation for goal of 70-100 Defer vitamin D level  Gastroesophageal reflux disease without esophagitis Diet discussed, medications PRN  Cancer of sigmoid colon (HCC) Continue follow up Dr. Paulita Fujita and Dr. Ammie Dalton Just had follow up CT 11/2019 which was benign  Elevated PSA Checked at CPE  Allergic rhinitis - Allegra OTC, increase H20, allergy hygiene explained.  Aortic aneurysm ([place]) Thoracic ascending aorta - last imaged 09/2018, 4.0 cm, stable from previous, overdue for follow up, discussed with patient who does with to pursue, CTA ordered  Infrarenal - last imaging in 10/2019, 3.3 cm, recommended 3 year [place] follow up Control blood pressure, cholesterol, glucose   Shared decision making; last labs reviewed, all labs deferred today per strong patient preference.    Future Appointments  Date Time Provider [place]  03/31/2020  10:30 AM Unk Pinto, MD [facility] None  09/27/2020  1:30 PM [facility] 1 [facility] None  10/19/2020 10:00 AM Unk Pinto, MD [facility] None  01/23/2021 11:15 AM [name], NP [facility] None      Plan:   During the course of the visit the patient was educated and counseled about appropriate screening and preventive services including:    Pneumococcal vaccine   Influenza vaccine  Td vaccine  Screening electrocardiogram  Colorectal cancer screening  Diabetes screening  Glaucoma screening  Nutrition counseling    Subjective:  Keith [name] is a 86 y.o. male who presents for Medicare Annual Wellness Visit and 3 month follow up for HTN, hyperlipidemia, prediabetes, and vitamin D Def.   Patient was diagnosed with colon cancer in Oct 2016, a/p resection 08/02/2015, completed final cycle of chemotherapy on 02/09/2016, monitoring by serial CTs by Dr. Benay Spice, had virtual colonoscopy CT in 10/2019 which was benign.   He has GERD recently well controlled; hasn't needed PRN medication in several months.   BMI is Body mass index is 23.24 kg/m., he has been working on diet and exercise. Wt Readings from Last 3 Encounters:  12/24/19 162 lb (73.5 kg)  10/12/19 161 lb 3.2 oz (73.1 kg)  09/28/19 161 lb 3.2 oz (73.1 kg)   His blood pressure has been controlled at home, today their BP: (!) 110/58  He does workout, walks and goes to the [facility] . He had a normal stress test 2008, EF 86%, had PTCA in 1991.  He denies chest pain, shortness of breath, dizziness.  He has known aortic atherosclerosis per multiple CTs, thoracic ascending aortic aneurysm last imaged in 09/2018 and found to be stable from previous at 4.0 cm, also with infrarenal [place],  most recently seen on CT abd 10/2019 showing 3.3 cm infrarenal abdominal aortic aneurysm. Recommend followup by ultrasound in 3 years (due 11/2022).   He is on cholesterol medication (atorvastatin 20 mg daily) and denies myalgias.  His cholesterol is at goal. The cholesterol last visit was:   Lab Results  Component Value Date   CHOL 160 09/23/2019   HDL 46 09/23/2019   LDLCALC 83 09/23/2019   TRIG 215 (H) 09/23/2019   CHOLHDL 3.5 09/23/2019   He has been working on diet and exercise for hx of prediabetes (6.0 in 2016, 5.7 in 11/2018), and denies paresthesia of the feet, polydipsia and polyuria. Last A1C in the office was:  Lab Results  Component Value Date   HGBA1C 5.6 09/23/2019   Patient is on Vitamin D supplement.   Lab Results  Component Value Date   VD25OH 46 09/23/2019    He has hx of elevated PSAs checked annually at CPE:  Lab Results  Component Value Date   PSA 3.0 09/23/2019   PSA 2.9 08/13/2018   PSA 2.6 07/12/2017  CT pelvis 08/2018 showed hypertrophy but no concerning lesions, "unremarkable" per CT 10/2019   Medication Review: Current Outpatient Medications on File Prior to Visit  Medication Sig Dispense Refill  . aspirin 81 MG tablet Take 81 mg by mouth every morning.     Marland Kitchen atorvastatin (LIPITOR) 40 MG tablet TAKE 1 TABLET DAILY FOR    CHOLESTEROL (Patient taking differently: Take 1/2 tablet daily for Cholesterol) 90 tablet 1  . Cholecalciferol (VITAMIN D PO) Take 5,000 Int'l Units by mouth every morning.     . gabapentin (NEURONTIN) 100 MG capsule Take 1 capsule 3  x /day for pain 30 capsule 1  . Magnesium 250 MG TABS Take 1 tablet by mouth daily.    . Omega-3 Fatty Acids (FISH OIL PO) Take 1 tablet by mouth every morning.     . triamcinolone ointment (KENALOG) 0.1 % Apply 1 application topically 2 (two) times daily. 80 g 3   No current facility-administered medications on file prior to visit.    Current Problems (verified) Patient Active Problem List   Diagnosis Date Noted  . Aortic atherosclerosis ([place]) 12/23/2019  . Aortic aneurysm ([place]) 12/23/2019  . ASHD (arteriosclerotic heart disease) 08/13/2018  . History of rectal cancer 08/13/2018  . Non-seasonal allergic rhinitis  05/12/2018  . Colon cancer ([place]) 07/15/2015  . Prediabetes 09/14/2013  . Labile hypertension   . Hyperlipidemia, mixed   . Gastroesophageal reflux disease   . Vitamin D deficiency   . Elevated PSA     Screening Tests Immunization History  Administered Date(s) Administered  . Fluad Quad(high Dose 65+) 06/09/2019  . Influenza Split 07/29/2013, 06/22/2015  . Influenza, High Dose Seasonal PF 06/15/2014, 06/12/2016  . Influenza-Unspecified 06/04/2017, 06/10/2018  . Pneumococcal Conjugate-13 06/15/2014  . Pneumococcal Polysaccharide-23 03/12/2016  . Td 02/09/2010  . Zoster 10/08/2000   Preventative care: Last colonoscopy: 08/20/2016, repeat 3 years - had virtual CT colonocopy in 10/2019, no further follow ups PET image 06/2015  CT chest: 08/2018 - aortic atherosclerosis, stable ascending aneurysm 4 cm CT abd/pelvis: 08/2018 - stable aneurysm, infrarenal 3 cm  CT abd/virtual colonoscopy - 10/23/2019 3.3 cm infrarenal abdominal aortic aneurysm. Recommend followup by ultrasound in 3 years.   Prior vaccinations: TD or Tdap: 2011 defer boosting for now Influenza: 06/2019 Pneumococcal: 2004, 2017 Prevnar 13: 2015 Shingles/Zostavax: 2002   Names of Other Physician/Practitioners you currently use: 1. [facility]  here for primary care 1) Dr. Trecia Rogers, eye doctor, 2020, bil cataracts will get surgery this year or next, doing well with glasses 2) Dr. Jone Baseman, last visit 2020 - goes q86m  Patient Care Team: Unk Pinto, MD as PCP - General (Internal Medicine) Dyke Maes, [place] (Optometry) Franchot Gallo, MD as Consulting Physician (Urology) Arsenio Loader, MD as Referring Physician (Orthopedic Surgery) Tania Ade, RN as Registered Nurse (Medical Oncology) Ladell Pier, MD as Consulting Physician (Oncology) Arta Silence, MD as Consulting Physician (Gastroenterology)   History reviewed: allergies, current medications, past  family history, past medical history, past social history, past surgical history and problem list  Family History  Problem Relation Age of Onset  . Heart disease Mother   . Heart disease Father    Past Surgical History:  Procedure Laterality Date  . EUS N/A 06/29/2015   Procedure: LOWER ENDOSCOPIC ULTRASOUND (EUS);  Surgeon: Arta Silence, MD;  Location: [facility];  Service: Endoscopy;  Laterality: N/A;  . HERNIA REPAIR     left inguinal  . XI ROBOTIC ASSISTED LOWER ANTERIOR RESECTION N/A 08/02/2015   Procedure: XI ROBOTIC ASSISTED LOWER ANTERIOR RESECTION;  Surgeon: Stark Klein, MD;  Location: [facility];  Service: General;  Laterality: N/A;   Risk Factors: Tobacco Social History   Tobacco Use  . Smoking status: Former Smoker    Quit date: 10/04/1965    Years since quitting: 54.2  . Smokeless tobacco: Never Used  Substance Use Topics  . Alcohol use: Yes    Alcohol/week: 0.0 standard drinks    Comment: rarely  . Drug use: No   He does not smoke.  Patient is not a former smoker. Are there smokers in your home (other than you)?  No  Alcohol Current alcohol use: social drinker  Caffeine Current caffeine use: coffee 1-2 /day  Exercise Current exercise: walking  Nutrition/Diet Current diet: in general, a "healthy" diet    Cardiac risk factors: advanced age (older than 70 for men, 10 for women), dyslipidemia, hypertension, male gender and smoking/ tobacco exposure.  Depression Screen (Note: if answer to either of the following is "Yes", a more complete depression screening is indicated)   Q1: Over the past two weeks, have you felt down, depressed or hopeless? No  Q2: Over the past two weeks, have you felt little interest or pleasure in doing things? No  Have you lost interest or pleasure in daily life? No  Do you often feel hopeless? No  Do you cry easily over simple problems? No  Activities of Daily Living In your present state of health, do you have any difficulty  performing the following activities?:  Driving? No Managing money?  No Feeding yourself? No Getting from bed to chair? No Climbing a flight of stairs? No Preparing food and eating?: No Bathing or showering? No Getting dressed: No Getting to the toilet? No Using the toilet:No Moving around from place to place: No In the past year have you fallen or had a near fall?:No   Vision Difficulties: No  Hearing Difficulties: No Do you often ask people to speak up or repeat themselves? No Do you experience ringing or noises in your ears? No Do you have difficulty understanding soft or whispered voices? No  Cognition  Do you feel that you have a problem with memory?No  Do you often misplace items? No  Do you feel safe at home?  Yes  Advanced directives Does patient have a [place]? Yes Does  patient have a Living Will? Yes  Cognitive Testing  Alert? Yes  Normal Appearance?Yes  Oriented to person? Yes  Place? Yes   Time? Yes  Recall of three objects?  Yes  Can perform simple calculations? Yes  Displays appropriate judgment?Yes  Can read the correct time from a watch face?Yes  Objective:   Blood pressure (!) 110/58, pulse (!) 46, temperature (!) 97.3 F (36.3 C), weight 162 lb (73.5 kg), SpO2 97 %. Body mass index is 23.24 kg/m.  General appearance: alert, no distress, WD/WN, male HEENT: normocephalic, sclerae anicteric, TMs pearly, nares patent, no discharge or erythema, pharynx normal Oral cavity: MMM, no lesions Neck: supple, no lymphadenopathy, no thyromegaly, no masses Heart: RRR, normal S1, S2, no murmurs Lungs: CTA bilaterally, no wheezes, rhonchi, or rales Abdomen: +bs, soft, non tender, non distended, no masses, no hepatomegaly, no splenomegaly Musculoskeletal: nontender, no swelling, no obvious deformity Extremities: no edema, no cyanosis, no clubbing Pulses: 2+ symmetric, upper and lower extremities, normal cap refill Neurological: alert,  oriented x 3, CN2-12 intact, strength normal upper extremities and lower extremities, sensation normal throughout, DTRs 2+ throughout, no cerebellar signs, gait normal Psychiatric: normal affect, behavior normal, pleasant   Medicare Attestation I have personally reviewed: The patient's medical and social history Their use of alcohol, tobacco or illicit drugs Their current medications and supplements The patient's functional ability including ADLs,fall risks, home safety risks, cognitive, and hearing and visual impairment Diet and physical activities Evidence for depression or mood disorders  The patient's weight, height, BMI, and visual acuity have been recorded in the chart.  I have made referrals, counseling, and provided education to the patient based on review of the above and I have provided the patient with a written personalized care plan for preventive services.     Izora Ribas, NP   12/24/2019

## 2019-12-24 ENCOUNTER — Ambulatory Visit (INDEPENDENT_AMBULATORY_CARE_PROVIDER_SITE_OTHER): Payer: Medicare HMO | Admitting: Adult Health

## 2019-12-24 ENCOUNTER — Other Ambulatory Visit: Payer: Self-pay

## 2019-12-24 ENCOUNTER — Encounter: Payer: Self-pay | Admitting: Adult Health

## 2019-12-24 VITALS — BP 110/58 | HR 46 | Temp 97.3°F | Wt 162.0 lb

## 2019-12-24 DIAGNOSIS — E782 Mixed hyperlipidemia: Secondary | ICD-10-CM | POA: Diagnosis not present

## 2019-12-24 DIAGNOSIS — I700 Atherosclerosis of aorta: Secondary | ICD-10-CM

## 2019-12-24 DIAGNOSIS — R7303 Prediabetes: Secondary | ICD-10-CM | POA: Diagnosis not present

## 2019-12-24 DIAGNOSIS — J3089 Other allergic rhinitis: Secondary | ICD-10-CM

## 2019-12-24 DIAGNOSIS — I2510 Atherosclerotic heart disease of native coronary artery without angina pectoris: Secondary | ICD-10-CM

## 2019-12-24 DIAGNOSIS — R6889 Other general symptoms and signs: Secondary | ICD-10-CM | POA: Diagnosis not present

## 2019-12-24 DIAGNOSIS — I719 Aortic aneurysm of unspecified site, without rupture: Secondary | ICD-10-CM | POA: Diagnosis not present

## 2019-12-24 DIAGNOSIS — R0989 Other specified symptoms and signs involving the circulatory and respiratory systems: Secondary | ICD-10-CM | POA: Diagnosis not present

## 2019-12-24 DIAGNOSIS — Z0000 Encounter for general adult medical examination without abnormal findings: Secondary | ICD-10-CM

## 2019-12-24 DIAGNOSIS — E559 Vitamin D deficiency, unspecified: Secondary | ICD-10-CM | POA: Diagnosis not present

## 2019-12-24 DIAGNOSIS — K219 Gastro-esophageal reflux disease without esophagitis: Secondary | ICD-10-CM

## 2019-12-24 DIAGNOSIS — Z85048 Personal history of other malignant neoplasm of rectum, rectosigmoid junction, and anus: Secondary | ICD-10-CM | POA: Diagnosis not present

## 2019-12-24 DIAGNOSIS — C187 Malignant neoplasm of sigmoid colon: Secondary | ICD-10-CM | POA: Diagnosis not present

## 2019-12-24 DIAGNOSIS — Z0001 Encounter for general adult medical examination with abnormal findings: Secondary | ICD-10-CM

## 2019-12-24 DIAGNOSIS — R972 Elevated prostate specific antigen [PSA]: Secondary | ICD-10-CM

## 2019-12-24 NOTE — Patient Instructions (Addendum)
Mr. Proehl , Thank you for taking time to come for your Medicare Wellness Visit. I appreciate your ongoing commitment to your health goals. Please review the following plan we discussed and let me know if I can assist you in the future.   These are the goals we discussed: Goals    . Blood Pressure < 140/80    . LDL CALC < 130       This is a list of the screening recommended for you and due dates:  Health Maintenance  Topic Date Due  . Tetanus Vaccine  02/10/2020  . Flu Shot  Completed  . Pneumonia vaccines  Completed      Abdominal Aortic Aneurysm  An aneurysm is a bulge in one of the blood vessels that carry blood away from the heart (artery). It happens when blood pushes up against a weak or damaged place in the wall of an artery. An abdominal aortic aneurysm happens in the main artery of the body (aorta). Some aneurysms may not cause problems. If it grows, it can burst or tear, causing bleeding inside the body. This is an emergency. It needs to be treated right away. What are the causes? The exact cause of this condition is not known. What increases the risk? The following may make you more likely to get this condition:  Being a male who is 84 years of age or older.  Being white (Caucasian).  Using tobacco.  Having a family history of aneurysms.  Having the following conditions: ? Hardening of the arteries (arteriosclerosis). ? Inflammation of the walls of an artery (arteritis). ? Certain genetic conditions. ? Being very overweight (obesity). ? An infection in the wall of the aorta (infectious aortitis). ? High cholesterol. ? High blood pressure (hypertension). What are the signs or symptoms? Symptoms depend on the size of the aneurysm and how fast it is growing. Most grow slowly and do not cause any symptoms. If symptoms do occur, they may include:  Pain in the belly (abdomen), side, or back.  Feeling full after eating only small amounts of food.  Feeling a  throbbing lump in the belly. Symptoms that the aneurysm has burst (ruptured) include:  Sudden, very bad pain in the belly, side, or back.  Feeling sick to your stomach (nauseous).  Throwing up (vomiting).  Feeling light-headed or passing out. How is this treated? Treatment for this condition depends on:  The size of the aneurysm.  How fast it is growing.  Your age.  Your risk of having it burst. If your aneurysm is smaller than 2 inches (5 cm), your doctor may manage it by:  Checking it often to see if it is getting bigger. You may have an imaging test (ultrasound) to check it every 3-6 months, every year, or every few years.  Giving you medicines to: ? Control blood pressure. ? Treat pain. ? Fight infection. If your aneurysm is larger than 2 inches (5 cm), you may need surgery to fix it. Follow these instructions at home: Lifestyle  Do not use any products that have nicotine or tobacco in them. This includes cigarettes, e-cigarettes, and chewing tobacco. If you need help quitting, ask your doctor.  Get regular exercise. Ask your doctor what types of exercise are best for you. Eating and drinking  Eat a heart-healthy diet. This includes eating plenty of: ? Fresh fruits and vegetables. ? Whole grains. ? Low-fat (lean) protein. ? Low-fat dairy products.  Avoid foods that are high in saturated fat  and cholesterol. These foods include red meat and some dairy products.  Do not drink alcohol if: ? Your doctor tells you not to drink. ? You are pregnant, may be pregnant, or are planning to become pregnant.  If you drink alcohol: ? Limit how much you use to:  0-1 drink a day for women.  0-2 drinks a day for men. ? Be aware of how much alcohol is in your drink. In the [place] one drink equals any of these:  One typical bottle of beer (12 oz).  One-half glass of wine (5 oz).  One shot of hard liquor (1 oz). General instructions  Take over-the-counter and  prescription medicines only as told by your doctor.  Keep your blood pressure within normal limits. Ask your doctor what your blood pressure should be.  Have your blood sugar (glucose) level and cholesterol levels checked regularly. Keep your blood sugar level and cholesterol levels within normal limits.  Avoid heavy lifting and activities that take a lot of effort. Ask your doctor what activities are safe for you.  Keep all follow-up visits as told by your doctor. This is important. ? Talk to your doctor about regular screenings to see if the aneurysm is getting bigger. Contact a doctor if you:  Have pain in your belly, side, or back.  Have a throbbing feeling in your belly.  Have a family history of aneurysms. Get help right away if you:  Have sudden, bad pain in your belly, side, or back.  Feel sick to your stomach.  Throw up.  Have trouble pooping (constipation).  Have trouble peeing (urinating).  Feel light-headed.  Have a fast heart rate when you stand.  Have sweaty skin that is cold to the touch (clammy).  Have shortness of breath.  Have a fever. These symptoms may be an emergency. Do not wait to see if the symptoms will go away. Get medical help right away. Call your local emergency services (911 in the [place] Do not drive yourself to the hospital. Summary  An aneurysm is a bulge in one of the blood vessels that carry blood away from the heart (artery). Some aneurysms may not cause problems.  You may need to have yours checked often. If it grows, it can burst or tear. This causes bleeding inside the body. It needs to be treated right away.  Follow instructions from your doctor about healthy lifestyle changes.  Keep all follow-up visits as told by your doctor. This is important. This information is not intended to replace advice given to you by your health care provider. Make sure you discuss any questions you have with your health care provider. Document  Revised: 01/12/2019 Document Reviewed: 05/03/2018 Elsevier Patient Education  [place].

## 2019-12-25 ENCOUNTER — Encounter: Payer: Self-pay | Admitting: Adult Health

## 2020-01-04 ENCOUNTER — Other Ambulatory Visit: Payer: Self-pay | Admitting: Adult Health

## 2020-01-04 DIAGNOSIS — R69 Illness, unspecified: Secondary | ICD-10-CM | POA: Diagnosis not present

## 2020-01-04 DIAGNOSIS — I719 Aortic aneurysm of unspecified site, without rupture: Secondary | ICD-10-CM

## 2020-01-05 ENCOUNTER — Ambulatory Visit
Admission: RE | Admit: 2020-01-05 | Discharge: 2020-01-05 | Disposition: A | Discharge status: Home / Self Care | Payer: Medicare HMO | Source: Ambulatory Visit | Attending: Adult Health | Admitting: Adult Health

## 2020-01-05 DIAGNOSIS — K8020 Calculus of gallbladder without cholecystitis without obstruction: Secondary | ICD-10-CM | POA: Diagnosis not present

## 2020-01-05 DIAGNOSIS — I719 Aortic aneurysm of unspecified site, without rupture: Secondary | ICD-10-CM

## 2020-01-05 DIAGNOSIS — I712 Thoracic aortic aneurysm, without rupture: Secondary | ICD-10-CM | POA: Diagnosis not present

## 2020-01-05 MED ORDER — IOPAMIDOL (ISOVUE-370) INJECTION 76%
75.0000 mL | Freq: Once | INTRAVENOUS | Status: AC | PRN
  Administered 2020-01-05: 75 mL via INTRAVENOUS

## 2020-01-06 ENCOUNTER — Other Ambulatory Visit: Payer: Self-pay | Admitting: Adult Health

## 2020-01-06 DIAGNOSIS — I712 Thoracic aortic aneurysm, without rupture, unspecified: Secondary | ICD-10-CM

## 2020-01-09 ENCOUNTER — Other Ambulatory Visit: Payer: Self-pay | Admitting: Internal Medicine

## 2020-01-09 DIAGNOSIS — E782 Mixed hyperlipidemia: Secondary | ICD-10-CM

## 2020-02-12 ENCOUNTER — Telehealth (HOSPITAL_COMMUNITY): Payer: Self-pay

## 2020-02-12 NOTE — Telephone Encounter (Signed)

## 2020-02-15 ENCOUNTER — Other Ambulatory Visit: Payer: Self-pay

## 2020-02-15 ENCOUNTER — Encounter: Payer: Self-pay | Admitting: Surgery

## 2020-02-15 ENCOUNTER — Ambulatory Visit (INDEPENDENT_AMBULATORY_CARE_PROVIDER_SITE_OTHER): Payer: Medicare HMO | Admitting: Surgery

## 2020-02-15 VITALS — BP 133/71 | HR 60 | Temp 97.3°F | Resp 20 | Ht 70.0 in | Wt 163.0 lb

## 2020-02-15 DIAGNOSIS — I714 Abdominal aortic aneurysm, without rupture, unspecified: Secondary | ICD-10-CM

## 2020-02-15 NOTE — Progress Notes (Signed)
Vascular and Vein Specialist of [facility]  Patient name: Keith [name] MRN: [id number] DOB: 02/18/1932 Sex: male   REQUESTING PROVIDER:    Rocky Crafts   REASON FOR CONSULT:    aneurysm  HISTORY OF PRESENT ILLNESS:   Keith [name] is a 86 y.o. male, who is referred for evaluation of an abdominal aortic aneurysm.  This was detected on a virtual colonoscopy.  He does not have any abdominal pain.  The patient has a history of colon cancer approximately 4 years ago status post resection.  He is disease-free currently.  He does take a statin for hypercholesterolemia.  He is medically managed for hypertension.  He has a 10-year history of smoking in his 46s.  PAST MEDICAL HISTORY    Past Medical History:  Diagnosis Date  . 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 Elevated PSA   . GERD (gastroesophageal reflux disease)   . Hemorrhoids   . Hyperlipidemia   . Hypertension   . Pneumonia    age 89  . Vitamin D deficiency      FAMILY HISTORY   Family History  Problem Relation Age of Onset  . Heart disease Mother   . Heart disease Father     SOCIAL HISTORY:   Social History   Socioeconomic History  . Marital status: Married    Spouse name: Not on file  . Number of children: Not on file  . Years of education: Not on file  . Highest education level: Not on file  Occupational History  . Not on file  Tobacco Use  . Smoking status: Former Smoker    Quit date: 10/04/1965    Years since quitting: 54.4  . Smokeless tobacco: Never Used  Substance and Sexual Activity  . Alcohol use: Yes    Alcohol/week: 0.0 standard drinks    Comment: rarely  . Drug use: No  . Sexual activity: Not on file  Other Topics Concern  . Not on file  Social History Narrative   Married, wife Pamala Hurry   Retired   Social Determinants of Radio broadcast assistant Strain:   . Difficulty of Paying Living Expenses:   Food  Insecurity:   . Worried About Charity fundraiser in the Last Year:   . Arboriculturist in the Last Year:   Transportation Needs:   . Film/video editor (Medical):   Marland Kitchen Lack of Transportation (Non-Medical):   Physical Activity:   . Days of Exercise per Week:   . Minutes of Exercise per Session:   Stress:   . Feeling of Stress :   Social Connections:   . Frequency of Communication with Friends and Family:   . Frequency of Social Gatherings with Friends and Family:   . Attends Religious Services:   . Active Member of Clubs or Organizations:   . Attends Archivist Meetings:   Marland Kitchen Marital Status:   Intimate Partner Violence:   . Fear of Current or Ex-Partner:   . Emotionally Abused:   Marland Kitchen Physically Abused:   . Sexually Abused:     ALLERGIES:    Allergies  Allergen Reactions  . Penicillins          CURRENT MEDICATIONS:    Current Outpatient Medications  Medication Sig Dispense Refill  . aspirin 81 MG tablet Take 81 mg by mouth every morning.     Marland Kitchen atorvastatin (LIPITOR) 40 MG tablet Take 1/2  tablet Daily for  Cholesterol 45 tablet 3  . Cholecalciferol (VITAMIN D PO) Take 5,000 Int'l Units by mouth every morning.     . gabapentin (NEURONTIN) 100 MG capsule Take 1 capsule 3  x /day for pain 30 capsule 1  . Magnesium 250 MG TABS Take 1 tablet by mouth daily.    . Omega-3 Fatty Acids (FISH OIL PO) Take 1 tablet by mouth every morning.     . triamcinolone ointment (KENALOG) 0.1 % Apply 1 application topically 2 (two) times daily. 80 g 3   No current facility-administered medications for this visit.    REVIEW OF SYSTEMS:   [X]  denotes positive finding, [ ]  denotes negative finding Cardiac  Comments:  Chest pain or chest pressure:    Shortness of breath upon exertion:    Short of breath when lying flat:    Irregular heart rhythm:        Vascular    Pain in calf, thigh, or hip brought on by ambulation:    Pain in feet at night that wakes you up from your  sleep:     Blood clot in your veins:    Leg swelling:         Pulmonary    Oxygen at home:    Productive cough:     Wheezing:         Neurologic    Sudden weakness in arms or legs:     Sudden numbness in arms or legs:     Sudden onset of difficulty speaking or slurred speech:    Temporary loss of vision in one eye:     Problems with dizziness:         Gastrointestinal    Blood in stool:      Vomited blood:         Genitourinary    Burning when urinating:     Blood in urine:        Psychiatric    Major depression:         Hematologic    Bleeding problems:    Problems with blood clotting too easily:        Skin    Rashes or ulcers:        Constitutional    Fever or chills:     PHYSICAL EXAM:   Vitals:   02/15/20 0943  BP: 133/71  Pulse: 60  Resp: 20  Temp: (!) 97.3 F (36.3 C)  SpO2: 99%  Weight: 163 lb (73.9 kg)  Height: 5\' 10"  (1.778 m)    GENERAL: The patient is a well-nourished male, in no acute distress. The vital signs are documented above. CARDIAC: There is a regular rate and rhythm.  VASCULAR: Palpable pedal pulses PULMONARY: Nonlabored respirations ABDOMEN: Soft and non-tender   MUSCULOSKELETAL: There are no major deformities or cyanosis. NEUROLOGIC: No focal weakness or paresthesias are detected. SKIN: There are no ulcers or rashes noted. PSYCHIATRIC: The patient has a normal affect.  STUDIES:   I have reviewed the following: 1. Slight interval growth of previously demonstrated ascending thoracic aortic aneurysm, now measuring approximately 4.2 cm (previously measuring 4 cm). Recommend annual imaging followup by CTA or MRA. This recommendation follows 2010 ACCF/AHA/AATS/ACR/ASA/SCA/SCAI/SIR/STS/SVM Guidelines for the Diagnosis and Management of Patients with Thoracic Aortic Disease. Circulation. 2010; 121: X833-A250. Aortic aneurysm NOS (ICD10-I71.9) 2. Interval growth of the infrarenal abdominal aortic aneurysm, now measuring 3.2 cm  (previously measuring 3 cm). Recommend followup by ultrasound in 3 years. This recommendation follows ACR consensus guidelines: White  Paper of the ACR Incidental Findings Committee II on Vascular Findings. J Am Coll Radiol 2013; 10:789-794. Aortic aneurysm NOS (ICD10-I71.9) 3. No acute abnormality detected within the chest, abdomen, or pelvis. 4. There is cholelithiasis without secondary signs of acute cholecystitis. 5. 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 The SMA and IMA remain patent. 6. There is some mild bilateral bronchial wall thickening mucous plugging which may represent reactive or infectious bronchiolitis. 7. Additional chronic findings as detailed above.  ASSESSMENT and PLAN   AAA: Maximum aortic diameter is 3.2 cm.  He is at very low risk for rupture.  I will plan on routine surveillance.  His next scan will be an ultrasound in 1 year.  Ascending aortic aneurysm: Maximum diameter is 4.2 cm.  I will refer him to cardiac surgery for further surveillance.   Leia Alf, MD, FACS [facility] and Vein Specialists of [facility] [phone] Pager [phone]

## 2020-02-16 ENCOUNTER — Other Ambulatory Visit: Payer: Self-pay | Admitting: *Deleted

## 2020-02-16 DIAGNOSIS — I714 Abdominal aortic aneurysm, without rupture, unspecified: Secondary | ICD-10-CM

## 2020-02-17 ENCOUNTER — Encounter: Payer: Medicare HMO | Admitting: Surgery

## 2020-02-24 ENCOUNTER — Encounter: Payer: Self-pay | Admitting: Cardiothoracic Surgery

## 2020-02-24 ENCOUNTER — Institutional Professional Consult (permissible substitution): Payer: Medicare HMO | Admitting: Cardiothoracic Surgery

## 2020-02-24 ENCOUNTER — Other Ambulatory Visit: Payer: Self-pay

## 2020-02-24 VITALS — BP 135/68 | HR 62 | Temp 97.7°F | Resp 16 | Ht 70.0 in | Wt 163.0 lb

## 2020-02-24 DIAGNOSIS — I712 Thoracic aortic aneurysm, without rupture, unspecified: Secondary | ICD-10-CM

## 2020-02-24 NOTE — Progress Notes (Signed)
PCP is Unk Pinto, MD Referring Provider is Serafina Mitchell, MD  Chief Complaint  Patient presents with  . TAA    eval with CTA C/A/P 01/06/20    HPI: The patient presents for evaluation and treatment of an asymptomatic moderate 4.5 cm fusiform ascending thoracic aneurysm. This was noted as an incidental finding on a surveillance scan following resection of colon cancer 5 years ago. The patient has remote history of coronary disease and a coronary intervention 20 years ago for which there are no records. He has hypertension listed as a medical problem but is taking no medications for high blood pressure and measures his blood pressure frequently. His blood pressure remains 563-893 mmHg systolic. There is no family history of aortic abdominal aneurysm disease requiring surgery or aortic dissection. Both parents lived to be well into their 48s.  The colon cancer has been followed and there is been no evidence of recurrence for 5 years. The last CT scan of the chest was 2018, 3 years ago and his ascending aorta measured the same diameter as the most recent scan March 2021, about 4.5 cm. The aortic wall shows no evidence of mural thrombus or penetrating ulcer.  Past Medical History:  Diagnosis Date  . 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 Elevated PSA   . GERD (gastroesophageal reflux disease)   . Hemorrhoids   . Hyperlipidemia   . Hypertension   . Pneumonia    age 72  . Vitamin D deficiency     Past Surgical History:  Procedure Laterality Date  . EUS N/A 06/29/2015   Procedure: LOWER ENDOSCOPIC ULTRASOUND (EUS);  Surgeon: Arta Silence, MD;  Location: [facility];  Service: Endoscopy;  Laterality: N/A;  . HERNIA REPAIR     left inguinal  . XI ROBOTIC ASSISTED LOWER ANTERIOR RESECTION N/A 08/02/2015   Procedure: XI ROBOTIC ASSISTED LOWER ANTERIOR RESECTION;  Surgeon: Stark Klein, MD;  Location: [facility];  Service: General;  Laterality: N/A;     Family History  Problem Relation Age of Onset  . Heart disease Mother   . Heart disease Father     Social History Social History   Tobacco Use  . Smoking status: Former Smoker    Quit date: 10/04/1965    Years since quitting: 54.4  . Smokeless tobacco: Never Used  Substance Use Topics  . Alcohol use: Yes    Alcohol/week: 0.0 standard drinks    Comment: rarely  . Drug use: No    Current Outpatient Medications  Medication Sig Dispense Refill  . aspirin 81 MG tablet Take 81 mg by mouth every morning.     Marland Kitchen atorvastatin (LIPITOR) 40 MG tablet Take 1/2  tablet Daily for Cholesterol 45 tablet 3  . Cholecalciferol (VITAMIN D PO) Take 5,000 Int'l Units by mouth every morning.     . gabapentin (NEURONTIN) 100 MG capsule Take 1 capsule 3  x /day for pain 30 capsule 1  . Magnesium 250 MG TABS Take 1 tablet by mouth daily.    . Omega-3 Fatty Acids (FISH OIL PO) Take 1 tablet by mouth every morning.     . triamcinolone ointment (KENALOG) 0.1 % Apply 1 application topically 2 (two) times daily. 80 g 3   No current facility-administered medications for this visit.    Allergies  Allergen Reactions  . Penicillins  Review of Systems :  [ y ] = yes, [  ] = no        General :  Weight gain [   ]    Weight loss  [   ]  Fatigue [  ]  Fever [  ]  Chills  [  ]                                          HEENT    Headache [  ]  Dizziness [  ]  Blurred vision [  ] Glaucoma  [  ]                          Nosebleeds [  ] Painful or loose teeth [  ]        Cardiac :  Chest pain/ pressure [  ]  Resting SOB [  ] exertional SOB [  ]                        Orthopnea [  ]  Pedal edema  [  ]  Palpitations [  ] Syncope/presyncope [ ]                         Paroxysmal nocturnal dyspnea [  ]         Pulmonary : cough [  ]  wheezing [  ]  Hemoptysis [  ] Sputum [  ] Snoring [  ]                              Pneumothorax [  ]  Sleep apnea [  ]        GI : Vomiting [   ]  Dysphagia [  ]  Melena  [  ]  Abdominal pain [  ] BRBPR [  ]              Heart burn [  ]  Constipation [  ] Diarrhea  [  ] Colonoscopy [ y  ]        GU : Hematuria [  ]  Dysuria [  ]  Nocturia [  ] UTI's [  ]        Vascular : Claudication [  ]  Rest pain [  ]  DVT [  ] Vein stripping [  ] leg ulcers [  ]                          TIA [  ] Stroke [  ]  Varicose veins [  ]        NEURO :  Headaches  [  ] Seizures [  ] Vision changes [  ] Paresthesias [  ]                                               Musculoskeletal :  Arthritis [  ] Gout  [  ]  Back pain [  ]  Joint pain [  ]  Skin :  Rash [  ]  Melanoma [  ] Sores [  ]        Heme : Bleeding problems [  ]Clotting Disorders [  ] Anemia [  ]Blood Transfusion [ ]         Endocrine : Diabetes [  ] Heat or Cold intolerance [  ] Polyuria [  ]excessive thirst [ ]         Psych : Depression [  ]  Anxiety [  ]  Psych hospitalizations [  ] Memory change [  ]          No problems with anesthesia for his colon resection or for his hernia repair     No history of bleeding disorder or blood transfusion    Patient is married and lives with wife in good health He is right-hand dominant, weight has been stable                                                                   BP 135/68 (BP Location: Right Arm, Patient Position: Sitting, Cuff Size: Normal)   Pulse 62   Temp 97.7 F (36.5 C)   Resp 16   Ht 5\' 10"  (1.778 m)   Wt 163 lb (73.9 kg)   SpO2 97% Comment: RA  BMI 23.39 kg/m  Physical Exam      Physical Exam  General: Well-nourished very fit appearing 86 year old male no acute distress HEENT: Normocephalic pupils equal , dentition adequate Neck: Supple without JVD, adenopathy, or bruit Chest: Clear to auscultation, symmetrical breath sounds, no rhonchi, no tenderness             or deformity Cardiovascular: Regular rate and rhythm, no murmur, no gallop, peripheral pulses             palpable in all  extremities Abdomen:  Soft, nontender, no palpable mass or organomegaly Extremities: Warm, well-perfused, no clubbing cyanosis edema or tenderness,              no venous stasis changes of the legs Rectal/GU: Deferred Neuro: Grossly non--focal and symmetrical throughout Skin: Clean and dry without rash or ulceration   Diagnostic Tests: CT scan images performed March 2021 personally reviewed and counseled with patient. He has a moderate fusiform ascending aneurysm approxifour 0.5 cm diameter.  Impression: Moderate fusiform ascending thoracic aneurysm 4.5 cm. This is not changed in the past 3 years. This represents a very low risk of tear-less than 1%. The guidelines for resection recommend replacement of the ascending aorta for a diameter of 5.5 cm. At age 38 the patient would be extremely high risk for aortic surgery even though he appears to be a healthy 87 years. Best therapy is prevention of further dilatation of his ascending aorta by controlling blood pressure with a goal to keep systolic pressure less than 140. He will also need annual scan which we will repeat in 1 year and if no change then the interval can be increased  Plan: Return in 1 year with CTA of the chest and abdomen. He is also being followed by Dr. Trula Slade for a 3.2 cm AAA so the timing of the scans can be coordinated.   Len Childs, MD [facility] (  [phone] 

## 2020-03-30 ENCOUNTER — Encounter: Payer: Self-pay | Admitting: Internal Medicine

## 2020-03-30 NOTE — Patient Instructions (Signed)

## 2020-03-30 NOTE — Progress Notes (Signed)
History of Present Illness:       This very nice 86 y.o.  MWM presents for 6 month follow up with HTN, HLD, Pre-Diabetes and Vitamin D Deficiency.  Hx/o Colon Ca (2016) .       Patient is followed expectantly for labile HTN (1981)  & BP has been controlled at home. In 1981 , he underwent PTCA and in 2008 had a Negative / Normal stress Cardiolite. Today's BP is at goal  - 120/62. Patient has had no complaints of any cardiac type chest pain, palpitations, dyspnea / orthopnea / PND, dizziness, claudication, or dependent edema.      Hyperlipidemia is controlled with diet & Atorvastatin. Patient denies myalgias or other med SE's. Last Lipids were at goal except elevated Trig's:  Lab Results  Component Value Date   CHOL 160 09/23/2019   HDL 46 09/23/2019   LDLCALC 83 09/23/2019   TRIG 215 (H) 09/23/2019   CHOLHDL 3.5 09/23/2019    Also, the patient has history of PreDiabetes (A1c 5.9% / 2014) and has had no symptoms of reactive hypoglycemia, diabetic polys, paresthesias or visual blurring.  Last A1c was Normal & at goal:  Lab Results  Component Value Date   HGBA1C 5.6 09/23/2019       Further, the patient also has history of Vitamin D Deficiency ("27" / 2009) and supplements vitamin D without any suspected side-effects. Last vitamin D was near goal:  Lab Results  Component Value Date   VD25OH 49 09/23/2019    Current Outpatient Medications on File Prior to Visit  Medication Sig  . aspirin 81 MG tablet Take 81 mg by mouth every morning.   Marland Kitchen atorvastatin (LIPITOR) 40 MG tablet Take 1/2  tablet Daily for Cholesterol  . Cholecalciferol (VITAMIN D PO) Take 5,000 Int'l Units by mouth every morning.   . gabapentin (NEURONTIN) 100 MG capsule Take 1 capsule 3  x /day for pain  . Magnesium 250 MG TABS Take 1 tablet by mouth daily.  . Omega-3 Fatty Acids (FISH OIL PO) Take 1 tablet by mouth every morning.   . triamcinolone ointment (KENALOG) 0.1 % Apply 1 application topically 2  (two) times daily.   No current facility-administered medications on file prior to visit.    Allergies  Allergen Reactions  . Penicillins     PMHx:   Past Medical History:  Diagnosis Date  . 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 Elevated PSA   . GERD (gastroesophageal reflux disease)   . Hemorrhoids   . Hyperlipidemia   . Hypertension   . Pneumonia    age 9  . Vitamin D deficiency     Immunization History  Administered Date(s) Administered  . Fluad Quad(high Dose 65+) 06/09/2019  . Influenza Split 07/29/2013, 06/22/2015  . Influenza, High Dose Seasonal PF 06/15/2014, 06/12/2016  . Influenza-Unspecified 06/04/2017, 06/10/2018  . PFIZER SARS-COV-2 Vaccination 10/27/2019, 11/18/2019  . Pneumococcal Conjugate-13 06/15/2014  . Pneumococcal Polysaccharide-23 03/12/2016  . Td 02/09/2010  . Zoster 10/08/2000    Past Surgical History:  Procedure Laterality Date  . EUS N/A 06/29/2015   Procedure: LOWER ENDOSCOPIC ULTRASOUND (EUS);  Surgeon: Arta Silence, MD;  Location: [facility];  Service: Endoscopy;  Laterality: N/A;  . HERNIA REPAIR     left inguinal  . XI ROBOTIC ASSISTED LOWER ANTERIOR RESECTION N/A 08/02/2015   Procedure: XI ROBOTIC ASSISTED LOWER ANTERIOR RESECTION;  Surgeon: Stark Klein, MD;  Location: [facility];  Service: General;  Laterality: N/A;    FHx:    Reviewed / unchanged  SHx:    Reviewed / unchanged   Systems Review:  Constitutional: Denies fever, chills, wt changes, headaches, insomnia, fatigue, night sweats, change in appetite. Eyes: Denies redness, blurred vision, diplopia, discharge, itchy, watery eyes.  ENT: Denies discharge, congestion, post nasal drip, epistaxis, sore throat, earache, hearing loss, dental pain, tinnitus, vertigo, sinus pain, snoring.  CV: Denies chest pain, palpitations, irregular heartbeat, syncope, dyspnea, diaphoresis, orthopnea, PND, claudication or edema. Respiratory: denies cough, dyspnea, DOE,  pleurisy, hoarseness, laryngitis, wheezing.  Gastrointestinal: Denies dysphagia, odynophagia, heartburn, reflux, water brash, abdominal pain or cramps, nausea, vomiting, bloating, diarrhea, constipation, hematemesis, melena, hematochezia  or hemorrhoids. Genitourinary: Denies dysuria, frequency, urgency, nocturia, hesitancy, discharge, hematuria or flank pain. Musculoskeletal: Denies arthralgias, myalgias, stiffness, jt. swelling, pain, limping or strain/sprain.  Skin: Denies pruritus, rash, hives, warts, acne, eczema or change in skin lesion(s). Neuro: No weakness, tremor, incoordination, spasms, paresthesia or pain. Psychiatric: Denies confusion, memory loss or sensory loss. Endo: Denies change in weight, skin or hair change.  Heme/Lymph: No excessive bleeding, bruising or enlarged lymph nodes.  Physical Exam  BP 120/62   Pulse (!) 56   Temp 97.8 F (36.6 C)   Resp 16   Ht 5\' 10"  (1.778 m)   Wt 161 lb 12.8 oz (73.4 kg)   BMI 23.22 kg/m   Appears  well nourished, well groomed  and in no distress.  Eyes: PERRLA, EOMs, conjunctiva no swelling or erythema. Sinuses: No frontal/maxillary tenderness ENT/Mouth: EAC's clear, TM's nl w/o erythema, bulging. Nares clear w/o erythema, swelling, exudates. Oropharynx clear without erythema or exudates. Oral hygiene is good. Tongue normal, non obstructing. Hearing intact.  Neck: Supple. Thyroid not palpable. Car 2+/2+ without bruits, nodes or JVD. Chest: Respirations nl with BS clear & equal w/o rales, rhonchi, wheezing or stridor.  Cor: Heart sounds normal w/ regular rate and rhythm without sig. murmurs, gallops, clicks or rubs. Peripheral pulses normal and equal  without edema.  Abdomen: Soft & bowel sounds normal. Non-tender w/o guarding, rebound, hernias, masses or organomegaly.  Lymphatics: Unremarkable.  Musculoskeletal: Full ROM all peripheral extremities, joint stability, 5/5 strength and normal gait.  Skin: Warm, dry without exposed  rashes, lesions or ecchymosis apparent.  Neuro: Cranial nerves intact, reflexes equal bilaterally. Sensory-motor testing grossly intact. Tendon reflexes grossly intact.  Pysch: Alert & oriented x 3.  Insight and judgement nl & appropriate. No ideations.  Assessment and Plan:  1. Labile hypertension  - Continue medication, monitor blood pressure at home.  - Continue DASH diet.  Reminder to go to the ER if any CP,  SOB, nausea, dizziness, severe HA, changes vision/speech.  - CBC with Differential/Platelet - COMPLETE METABOLIC PANEL WITH GFR - Magnesium - TSH - Hemoglobin A1c - Insulin, random  2. Hyperlipidemia, mixed  - Continue diet/meds, exercise,& lifestyle modifications.  - Continue monitor periodic cholesterol/liver & renal functions   - Lipid panel - TSH  3. Abnormal glucose  - Continue diet, exercise  - Lifestyle modifications.  - Monitor appropriate labs.  - Hemoglobin A1c - Insulin, random  4. Vitamin D deficiency  - Continue supplementation.  - VITAMIN D 25 Hydroxy  5. Medication management  - CBC with Differential/Platelet - COMPLETE METABOLIC PANEL WITH GFR - Magnesium - Lipid panel - TSH - Hemoglobin A1c - Insulin, random - VITAMIN D 25 Hydroxy       Discussed  regular exercise, BP monitoring, weight control to achieve/maintain  BMI less than 25 and discussed med and SE's. Recommended labs to assess and monitor clinical status with further disposition pending results of labs.  I discussed the assessment and treatment plan with the patient. The patient was provided an opportunity to ask questions and all were answered. The patient agreed with the plan and demonstrated an understanding of the instructions.  I provided over 30 minutes of exam, counseling, chart review and  complex critical decision making.   Kirtland Bouchard, MD

## 2020-03-31 ENCOUNTER — Other Ambulatory Visit: Payer: Self-pay

## 2020-03-31 ENCOUNTER — Ambulatory Visit (INDEPENDENT_AMBULATORY_CARE_PROVIDER_SITE_OTHER): Payer: Medicare HMO | Admitting: Internal Medicine

## 2020-03-31 ENCOUNTER — Encounter: Payer: Self-pay | Admitting: Internal Medicine

## 2020-03-31 VITALS — BP 120/62 | HR 56 | Temp 97.8°F | Resp 16 | Ht 70.0 in | Wt 161.8 lb

## 2020-03-31 DIAGNOSIS — E559 Vitamin D deficiency, unspecified: Secondary | ICD-10-CM | POA: Diagnosis not present

## 2020-03-31 DIAGNOSIS — R0989 Other specified symptoms and signs involving the circulatory and respiratory systems: Secondary | ICD-10-CM

## 2020-03-31 DIAGNOSIS — E782 Mixed hyperlipidemia: Secondary | ICD-10-CM

## 2020-03-31 DIAGNOSIS — R7309 Other abnormal glucose: Secondary | ICD-10-CM | POA: Diagnosis not present

## 2020-03-31 DIAGNOSIS — Z79899 Other long term (current) drug therapy: Secondary | ICD-10-CM | POA: Diagnosis not present

## 2020-04-01 LAB — LIPID PANEL
Cholesterol: 139 mg/dL (ref <200)
HDL: 46 mg/dL (ref >=40)
LDL Cholesterol (Calc): 75 mg/dL (calc)
Non-HDL Cholesterol (Calc): 93 mg/dL (calc) (ref <130)
Total CHOL/HDL Ratio: 3 (calc) (ref <5.0)
Triglycerides: 97 mg/dL (ref <150)

## 2020-04-01 LAB — CBC WITH DIFFERENTIAL/PLATELET
Absolute Monocytes: 490 cells/uL (ref 200–950)
Basophils Absolute: 29 cells/uL (ref 0–200)
Basophils Relative: 0.6 %
Eosinophils Absolute: 191 cells/uL (ref 15–500)
Eosinophils Relative: 3.9 %
HCT: 44.7 % (ref 38.5–50.0)
Hemoglobin: 14.7 g/dL (ref 13.2–17.1)
Lymphs Abs: 1446 cells/uL (ref 850–3900)
MCH: 29.8 pg (ref 27.0–33.0)
MCHC: 32.9 g/dL (ref 32.0–36.0)
MCV: 90.7 fL (ref 80.0–100.0)
MPV: 11.1 fL (ref 7.5–12.5)
Monocytes Relative: 10 %
Neutro Abs: 2744 cells/uL (ref 1500–7800)
Neutrophils Relative %: 56 %
Platelets: 147 10*3/uL (ref 140–400)
RBC: 4.93 10*6/uL (ref 4.20–5.80)
RDW: 12.5 % (ref 11.0–15.0)
Total Lymphocyte: 29.5 %
WBC: 4.9 10*3/uL (ref 3.8–10.8)

## 2020-04-01 LAB — COMPLETE METABOLIC PANEL WITH GFR
AG Ratio: 1.7 (calc) (ref 1.0–2.5)
ALT: 13 U/L (ref 9–46)
AST: 17 U/L (ref 10–35)
Albumin: 3.9 g/dL (ref 3.6–5.1)
Alkaline phosphatase (APISO): 57 U/L (ref 35–144)
BUN: 16 mg/dL (ref 7–25)
CO2: 34 mmol/L — ABNORMAL HIGH (ref 20–32)
Calcium: 9.5 mg/dL (ref 8.6–10.3)
Chloride: 104 mmol/L (ref 98–110)
Creat: 0.75 mg/dL (ref 0.70–1.11)
GFR, Est African American: 97 mL/min/{1.73_m2} (ref >=60)
GFR, Est Non African American: 84 mL/min/{1.73_m2} (ref >=60)
Globulin: 2.3 g/dL (calc) (ref 1.9–3.7)
Glucose, Bld: 94 mg/dL (ref 65–99)
Potassium: 4.7 mmol/L (ref 3.5–5.3)
Sodium: 140 mmol/L (ref 135–146)
Total Bilirubin: 1.1 mg/dL (ref 0.2–1.2)
Total Protein: 6.2 g/dL (ref 6.1–8.1)

## 2020-04-01 LAB — HEMOGLOBIN A1C
Hgb A1c MFr Bld: 5.7 % of total Hgb — ABNORMAL HIGH (ref <5.7)
Mean Plasma Glucose: 117 (calc)
eAG (mmol/L): 6.5 (calc)

## 2020-04-01 LAB — TSH: TSH: 1.32 mIU/L (ref 0.40–4.50)

## 2020-04-01 LAB — INSULIN, RANDOM: Insulin: 28.3 u[IU]/mL — ABNORMAL HIGH

## 2020-04-01 LAB — MAGNESIUM: Magnesium: 2 mg/dL (ref 1.5–2.5)

## 2020-04-01 LAB — VITAMIN D 25 HYDROXY (VIT D DEFICIENCY, FRACTURES): Vit D, 25-Hydroxy: 72 ng/mL (ref 30–100)

## 2020-04-01 NOTE — Progress Notes (Signed)
==========================================================  -    Total Chol = 139 and LDL Chol = 75 - Both Excellent   -  Very low risk for Heart Attack  / Stroke ==========================================================  -  A1c 5.7%  ==========================================================  -  Vitamin DS = 72 - excellent  ==========================================================  -  All Else - CBC - Kidneys - Electrolytes -  Liver - Magnesium & Thyroid  - all  Normal / OK ==========================================================

## 2020-06-14 DIAGNOSIS — H2513 Age-related nuclear cataract, bilateral: Secondary | ICD-10-CM | POA: Diagnosis not present

## 2020-06-17 DIAGNOSIS — R69 Illness, unspecified: Secondary | ICD-10-CM | POA: Diagnosis not present

## 2020-06-30 DIAGNOSIS — H2513 Age-related nuclear cataract, bilateral: Secondary | ICD-10-CM | POA: Diagnosis not present

## 2020-07-01 ENCOUNTER — Ambulatory Visit (INDEPENDENT_AMBULATORY_CARE_PROVIDER_SITE_OTHER): Payer: Medicare HMO | Admitting: Adult Health

## 2020-07-01 ENCOUNTER — Other Ambulatory Visit: Payer: Self-pay

## 2020-07-01 ENCOUNTER — Encounter: Payer: Self-pay | Admitting: Adult Health

## 2020-07-01 VITALS — BP 138/68 | HR 60 | Temp 95.5°F | Ht 70.0 in | Wt 162.6 lb

## 2020-07-01 DIAGNOSIS — W57XXXA Bitten or stung by nonvenomous insect and other nonvenomous arthropods, initial encounter: Secondary | ICD-10-CM

## 2020-07-01 DIAGNOSIS — T148XXA Other injury of unspecified body region, initial encounter: Secondary | ICD-10-CM

## 2020-07-01 DIAGNOSIS — S90861A Insect bite (nonvenomous), right foot, initial encounter: Secondary | ICD-10-CM

## 2020-07-01 DIAGNOSIS — L03119 Cellulitis of unspecified part of limb: Secondary | ICD-10-CM

## 2020-07-01 DIAGNOSIS — L089 Local infection of the skin and subcutaneous tissue, unspecified: Secondary | ICD-10-CM | POA: Diagnosis not present

## 2020-07-01 MED ORDER — DOXYCYCLINE HYCLATE 100 MG PO CAPS: ORAL_CAPSULE | ORAL | 0 refills | Status: DC

## 2020-07-01 NOTE — Patient Instructions (Addendum)
Spider Bite Spider bites are not common. Most spider bites do not cause serious problems. There are only a few types of spider bites that can cause serious health problems. What are the causes? This condition is caused when you make contact with a spider in a way that traps the spider against your skin. What are the signs or symptoms? Some spider bites may cause symptoms within 1 hour after the bite. For other spider bites, it may take 1-2 days for symptoms to appear. Symptoms include:  A raised area that is red.  Redness and swelling around the area of the bite.  Pain in the area of the bite. A few types of spiders, such as the black widow spider or the brown recluse spider, can inject poison (venom) into a bite wound. This causes more serious symptoms. Symptoms of these bites vary, and may include:  Muscle cramps.  Feeling sick to your stomach (nauseous).  Throwing up (vomiting).  Pain in your belly (abdomen).  A fever.  A skin sore (lesion) that spreads. This can break into an open wound (skin ulcer).  Feeling light-headed or dizzy. How is this treated? Many spider bites do not need treatment. If needed, treatment may include:  Icing and keeping the bite area raised (elevated).  Taking or applying over-the-counter or prescription medicines to help with symptoms such as pain and itching.  Having a tetanus shot.  Taking antibiotic medicine. Follow these instructions at home: Medicines  Take or apply over-the-counter and prescription medicines only as told by your doctor.  If you were prescribed an antibiotic medicine, take it as told by your doctor. Do not stop using it even if you start to feel better. Managing pain and swelling   If told, put ice on the bite area. ? Put ice in a plastic bag. ? Place a towel between your skin and the bag. ? Leave the ice on for 20 minutes, 2-3 times a day.  Raise the bite area above the level of your heart while you are sitting or  lying down. General instructions   Do not scratch the bite area.  Keep the bite area clean and dry. Wash the bite area with soap and water each day as told by your doctor.  Keep all follow-up visits as told by your doctor. This is important. Contact a doctor if:  Your bite does not get better after 3 days.  Your bite turns black or purple.  Near the bite, you have more: ? Redness. ? Swelling. ? Pain. Get help right away if:  You get shortness of breath or chest pain.  You have fluid, blood, or pus coming from the bite area.  You have painful muscle cramps or sudden muscle tightening (spasms).  You have belly pain.  You feel sick to your stomach or you throw up.  You feel more tired or sleepy than normal. Summary  Spider bites are not common. When spider bites do happen, most do not cause serious health problems.  Take or apply all medicines only as told by your doctor.  Keep the bite area clean and dry. Wash the bite area with soap and water each day as told by your doctor.  Contact a doctor if you have more redness, swelling, or pain near the bite.  Get help right away if you get shortness of breath or chest pain. This information is not intended to replace advice given to you by your health care provider. Make sure you discuss any  questions you have with your health care provider. Document Revised: 05/06/2018 Document Reviewed: 05/06/2018 Elsevier Patient Education  [place].    Doxycycline tablets or capsules What is this medicine? DOXYCYCLINE (dox i SYE kleen) is a tetracycline antibiotic. It kills certain bacteria or stops their growth. It is used to treat many kinds of infections, like dental, skin, respiratory, and urinary tract infections. It also treats acne, Lyme disease, malaria, and certain sexually transmitted infections. This medicine may be used for other purposes; ask your health care provider or pharmacist if you have questions. COMMON  BRAND NAME(S): Acticlate, Adoxa, Adoxa CK, Adoxa Pak, Adoxa TT, Alodox, Avidoxy, Doxal, LYMEPAK, Mondoxyne NL, Monodox, Morgidox 1x, Morgidox 1x Kit, Morgidox 2x, Morgidox 2x Kit, NutriDox, Ocudox, [place], [place], Vibra-Tabs, Vibramycin What should I tell my health care provider before I take this medicine? They need to know if you have any of these conditions:  liver disease  long exposure to sunlight like working outdoors  stomach problems like colitis  an unusual or allergic reaction to doxycycline, tetracycline antibiotics, other medicines, foods, dyes, or preservatives  pregnant or trying to get pregnant  breast-feeding How should I use this medicine? Take this medicine by mouth with a full glass of water. Follow the directions on the prescription label. It is best to take this medicine without food, but if it upsets your stomach take it with food. Take your medicine at regular intervals. Do not take your medicine more often than directed. Take all of your medicine as directed even if you think you are better. Do not skip doses or stop your medicine early. Talk to your pediatrician regarding the use of this medicine in children. While this drug may be prescribed for selected conditions, precautions do apply. Overdosage: If you think you have taken too much of this medicine contact a poison control center or emergency room at once. NOTE: This medicine is only for you. Do not share this medicine with others. What if I miss a dose? If you miss a dose, take it as soon as you can. If it is almost time for your next dose, take only that dose. Do not take double or extra doses. What may interact with this medicine?  antacids  barbiturates  birth control pills  bismuth subsalicylate  carbamazepine  methoxyflurane  other antibiotics  phenytoin  vitamins that contain iron  warfarin This list may not describe all possible interactions. Give your health care provider a list of  all the medicines, herbs, non-prescription drugs, or dietary supplements you use. Also tell them if you smoke, drink alcohol, or use illegal drugs. Some items may interact with your medicine. What should I watch for while using this medicine? Tell your doctor or health care professional if your symptoms do not improve. Do not treat diarrhea with over the counter products. Contact your doctor if you have diarrhea that lasts more than 2 days or if it is severe and watery. Do not take this medicine just before going to bed. It may not dissolve properly when you lay down and can cause pain in your throat. Drink plenty of fluids while taking this medicine to also help reduce irritation in your throat. This medicine can make you more sensitive to the sun. Keep out of the sun. If you cannot avoid being in the sun, wear protective clothing and use sunscreen. Do not use sun lamps or tanning beds/booths. Birth control pills may not work properly while you are taking this medicine. Talk  to your doctor about using an extra method of birth control. If you are being treated for a sexually transmitted infection, avoid sexual contact until you have finished your treatment. Your sexual partner may also need treatment. Avoid antacids, aluminum, calcium, magnesium, and iron products for 4 hours before and 2 hours after taking a dose of this medicine. If you are using this medicine to prevent malaria, you should still protect yourself from contact with mosquitos. Stay in screened-in areas, use mosquito nets, keep your body covered, and use an insect repellent. What side effects may I notice from receiving this medicine? Side effects that you should report to your doctor or health care professional as soon as possible:  allergic reactions like skin rash, itching or hives, swelling of the face, lips, or tongue  difficulty breathing  fever  itching in the rectal or genital area  pain on swallowing  rash, fever, and  swollen lymph nodes  redness, blistering, peeling or loosening of the skin, including inside the mouth  severe stomach pain or cramps  unusual bleeding or bruising  unusually weak or tired  yellowing of the eyes or skin Side effects that usually do not require medical attention (report to your doctor or health care professional if they continue or are bothersome):  diarrhea  loss of appetite  nausea, vomiting This list may not describe all possible side effects. Call your doctor for medical advice about side effects. You may report side effects to FDA at 1-800-FDA-1088. Where should I keep my medicine? Keep out of the reach of children. Store at room temperature, below 30 degrees C (86 degrees F). Protect from light. Keep container tightly closed. Throw away any unused medicine after the expiration date. Taking this medicine after the expiration date can make you seriously ill. NOTE: This sheet is a summary. It may not cover all possible information. If you have questions about this medicine, talk to your doctor, pharmacist, or health care provider.  2020 Elsevier/Gold Standard (2018-12-25 13:44:53)

## 2020-07-01 NOTE — Progress Notes (Signed)
Assessment and Plan:  Raney was seen today for insect bite.  Diagnoses and all orders for this visit:  Insect bite of right foot, initial encounter Possible spider bite, unwitnessed;  However with rapid worsening since yesterday, cannot rule out mild cellulitis will proceed with antibiotic treatment - doxycycline initiated Tetanus boosted as overdue Apply ice, take antihistamine, can use tylenol prn pain Monitor closely; keep close follow up on Monday that was scheduled for routine follow up. Go to the ER if any chest pain, shortness of breath, nausea, dizziness, severe HA, changes vision/speech  Infected bite wound -     Td : Tetanus/diphtheria >7yo Preservative  free  Cellulitis of foot -     doxycycline (VIBRAMYCIN) 100 MG capsule; Take 1 capsule twice daily with food  Further disposition pending results of labs. Discussed med's effects and SE's.   Over 15 minutes of exam, counseling, chart review, and critical decision making was performed.   Future Appointments  Date Time Provider [place]  07/04/2020  9:30 AM Vicie Mutters, PA-C [facility] None  09/27/2020  1:30 PM [facility] None  10/19/2020 10:00 AM Unk Pinto, MD [facility] None  01/23/2021 11:15 AM [name], NP [facility] None    ------------------------------------------------------------------------------------------------------------------   HPI BP 138/68    Pulse 60    Temp (!) 95.5 F (35.3 C)    Ht 5\' 10"  (1.778 m)    Wt 162 lb 9.6 oz (73.8 kg)    SpO2 98%    BMI 23.33 kg/m   86 y.o.male presents for evaluation of possible spider bite to R foot.   Reports was on his porch yesterday, noted after he came in and took sock off that there was an area to his mid foot with 2 puncture sites, no significant pain, but applied triple antibiotic and calamine lotion. Woke up this AM and noted area of swelling and redness, seems to be progressively worsening. Denies fever/chills, body  aches, nausea, dizziness.   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    atorvastatin (LIPITOR) 40 MG tablet Take 1/2  tablet Daily for Cholesterol   Cholecalciferol (VITAMIN D PO) Take 5,000 Int'l Units by mouth every morning.    gabapentin (NEURONTIN) 100 MG capsule Take 1 capsule 3  x /day for pain   Magnesium 250 MG TABS Take 1 tablet by mouth daily.   Omega-3 Fatty Acids (FISH OIL PO) Take 1 tablet by mouth every morning.    triamcinolone ointment (KENALOG) 0.1 % Apply 1 application topically 2 (two) times daily.   No current facility-administered medications on file prior to visit.    ROS: all negative except above.   Physical Exam:  BP 138/68    Pulse 60    Temp (!) 95.5 F (35.3 C)    Ht 5\' 10"  (1.778 m)    Wt 162 lb 9.6 oz (73.8 kg)    SpO2 98%  BMI 23.33 kg/m   General Appearance: Well nourished, well dressed elder male in no apparent distress. Eyes: PERRLA, conjunctiva no swelling or erythema ENT/Mouth: Mask in place; Hearing normal.  Neck: Supple, thyroid normal.  Respiratory: Respiratory effort normal, BS equal bilaterally without rales, rhonchi, wheezing or stridor.  Cardio: RRR with no MRGs. Brisk peripheral pulses without edema.  Lymphatics: Non tender without lymphadenopathy.  Musculoskeletal: Full ROM, 5/5 strength, mildly antalgic gait.  Skin: R foot with 1 cm area of dark red, to mid lateral foot, without open wound, blister or necrosis; surrounding area approx 12 cm warm, mildly swollen, pink Neuro:  Cranial nerves intact. Normal muscle tone, distal ensation intact.  Psych: Awake and oriented X 3, normal affect, Insight and Judgment appropriate.     Izora Ribas, NP 9:42 AM [facility]

## 2020-07-04 ENCOUNTER — Ambulatory Visit: Payer: Medicare HMO | Admitting: Adult Health

## 2020-07-04 ENCOUNTER — Encounter: Payer: Self-pay | Admitting: Physician Assistant

## 2020-07-04 ENCOUNTER — Ambulatory Visit (INDEPENDENT_AMBULATORY_CARE_PROVIDER_SITE_OTHER): Payer: Medicare HMO | Admitting: Physician Assistant

## 2020-07-04 ENCOUNTER — Other Ambulatory Visit: Payer: Self-pay

## 2020-07-04 VITALS — BP 130/72 | HR 57 | Temp 97.3°F | Wt 162.0 lb

## 2020-07-04 DIAGNOSIS — R7309 Other abnormal glucose: Secondary | ICD-10-CM | POA: Diagnosis not present

## 2020-07-04 DIAGNOSIS — R0989 Other specified symptoms and signs involving the circulatory and respiratory systems: Secondary | ICD-10-CM

## 2020-07-04 DIAGNOSIS — I712 Thoracic aortic aneurysm, without rupture, unspecified: Secondary | ICD-10-CM

## 2020-07-04 DIAGNOSIS — E782 Mixed hyperlipidemia: Secondary | ICD-10-CM

## 2020-07-04 DIAGNOSIS — I700 Atherosclerosis of aorta: Secondary | ICD-10-CM

## 2020-07-04 DIAGNOSIS — I719 Aortic aneurysm of unspecified site, without rupture: Secondary | ICD-10-CM | POA: Diagnosis not present

## 2020-07-04 DIAGNOSIS — I2510 Atherosclerotic heart disease of native coronary artery without angina pectoris: Secondary | ICD-10-CM | POA: Diagnosis not present

## 2020-07-04 NOTE — Patient Instructions (Signed)
Insect Bite, Adult An insect bite can make your skin red, itchy, and swollen. An insect bite is different from an insect sting, which happens when an insect injects poison (venom) into the skin. Some insects can spread disease to people through a bite. However, most insect bites do not lead to disease and are not serious. What are the causes? Insects may bite for a variety of reasons, including:  Hunger.  To defend themselves. Insects that bite include:  Spiders.  Mosquitoes.  Ticks.  Fleas.  Ants.  Flies.  Kissing bugs.  Chiggers. What are the signs or symptoms? Symptoms of this condition include:  Itching or pain in the bite area.  Redness and swelling in the bite area.  An open wound (skin ulcer). In many cases, symptoms last for 2-4 days. In rare cases, a person may have a severe allergic reaction (anaphylactic reaction) to a bite. Symptoms of an anaphylactic reaction may include:  Feeling warm in the face (flushed). This may include redness.  Itchy, red, swollen areas of skin (hives).  Swelling of the eyes, lips, face, mouth, tongue, or throat.  Difficulty breathing, speaking, or swallowing.  Noisy breathing (wheezing).  Dizziness or light-headedness.  Fainting.  Pain or cramping in the abdomen.  Vomiting.  Diarrhea. How is this diagnosed? This condition is usually diagnosed based on symptoms and a physical exam. How is this treated? Treatment is usually not needed. Symptoms often go away on their own. When treatment is recommended, it may involve:  Applying a cream or lotion to the bite area. This treatment helps with itching.  Taking an antibiotic medicine. This treatment is needed if the bite area gets infected.  Getting a tetanus shot, if you are not up to date on this vaccine.  Applying ice to the affected area.  Allergy medicines called antihistamines. This treatment may be needed if you develop itching or an allergic reaction to the  insect bite.  Giving yourself an epinephrine injection if you have an anaphylactic reaction to a bite. To give the injection, you will use what is commonly called an auto-injector "pen" (pre-filled automatic epinephrine injection device). Your health care provider will teach you how to use an auto-injector pen. Follow these instructions at home: Bite area care   Do not scratch the bite area.  Keep the bite area clean and dry. Wash it every day with soap and water as told by your health care provider.  Check the bite area every day for signs of infection. Check for: ? Redness, swelling, or pain. ? Fluid or blood. ? Warmth. ? Pus or a bad smell. Managing pain, itching, and swelling   You may apply cortisone cream, calamine lotion, or a paste made of baking soda and water to the bite area as told by your health care provider.  If directed, put ice on the bite area. ? Put ice in a plastic bag. ? Place a towel between your skin and the bag. ? Leave the ice on for 20 minutes, 2-3 times a day. General instructions  Apply or take over-the-counter and prescription medicines only as told by your health care provider.  If you were prescribed an antibiotic medicine, take or apply it as told by your health care provider. Do not stop using the antibiotic even if your condition improves.  Keep all follow-up visits as told by your health care provider. This is important. How is this prevented? To help reduce your risk of insect bites:  When you are outdoors,  wear clothing that covers your arms and legs. This is especially important in the early morning and evening.  Use insect repellent. The best insect repellents contain DEET, picaridin, oil of lemon eucalyptus (OLE), or IR3535.  Consider spraying your clothing with a pesticide called permethrin. Permethrin helps prevent insect bites. It works for several weeks and for up to 5-6 clothing washes. Do not apply permethrin directly to the  skin.  If your home windows do not have screens, consider installing them.  If you will be sleeping in an area where there are mosquitoes, consider covering your sleeping area with a mosquito net. Contact a health care provider if:  You have redness, swelling, or pain in the bite area.  You have fluid or blood coming from the bite area.  The bite area feels warm to the touch.  You have pus or a bad smell coming from the bite area.  You have a fever. Get help right away if:  You have joint pain.  You have a rash.  You feel unusually tired or sleepy.  You have neck pain.  You have a headache.  You have unusual weakness.  You develop symptoms of an anaphylactic reaction. These may include: ? Flushed skin. ? Hives. ? Swelling of the eyes, lips, face, mouth, tongue, or throat. ? Difficulty breathing, speaking, or swallowing. ? Wheezing. ? Dizziness or light-headedness. ? Fainting. ? Pain or cramping in the abdomen. ? Vomiting. ? Diarrhea. These symptoms may represent a serious problem that is an emergency. Do not wait to see if the symptoms will go away. Do the following right away:  Use the auto-injector pen as you have been instructed.  Get medical help. Call your local emergency services (911 in the [place] Do not drive yourself to the hospital. Summary  An insect bite can make your skin red, itchy, and swollen.  Treatment is usually not needed. Symptoms often go away on their own. When treatment is recommended, it may involve taking medicine, applying medicine to the area, or applying ice.  Apply or take over-the-counter and prescription medicines only as told by your health care provider.  Use insect repellent to help prevent insect bites.  Contact a health care provider if you have any signs of infection in the bite area. This information is not intended to replace advice given to you by your health care provider. Make sure you discuss any questions you have  with your health care provider. Document Revised: 04/04/2018 Document Reviewed: 04/04/2018 Elsevier Patient Education  [place].

## 2020-07-04 NOTE — Progress Notes (Signed)
FOLLOW UP  Assessment and Plan:   Thoracic aortic aneurysm without rupture (HCC) Control blood pressure, cholesterol, glucose, increase exercise.  Continue follow up vascular/thoracic surgery  Aortic atherosclerosis (HCC) Control blood pressure, cholesterol, glucose, increase exercise.  Continue follow up vascular/thoracic surgery  Aortic aneurysm without rupture, unspecified portion of aorta (HCC) Control blood pressure, cholesterol, glucose, increase exercise.  Continue follow up vascular/thoracic surgery  ASHD S/p PCA, continue ASA, statin Control blood pressure, cholesterol, glucose, increase exercise.   Hypertension Well controlled with current medications  Monitor blood pressure at home; patient to call if consistently greater than 130/80 Continue DASH diet.   Reminder to go to the ER if any CP, SOB, nausea, dizziness, severe HA, changes vision/speech, left arm numbness and tingling and jaw pain.  Cholesterol Currently at goal; continue atorvastatin 20 mg  Continue low cholesterol diet and exercise.  Check lipid panel.   Prediabetes Continue diet and exercise.  Perform daily foot/skin check, notify office of any concerning changes.  Check A1C  Vitamin D Def At goal at last visit; continue supplementation to maintain goal of 60-100 Defer Vit D level  Continue diet and meds as discussed. Further disposition pending results of labs. Discussed med's effects and SE's.   Over 30 minutes of exam, counseling, chart review, and critical decision making was performed.   Future Appointments  Date Time Provider [place]  09/27/2020  1:30 PM [facility] None  10/19/2020 10:00 AM Unk Pinto, MD [facility] None  01/23/2021 11:15 AM [name], NP [facility] None    ----------------------------------------------------------------------------------------------------------------------  HPI 86 y.o. male  presents for 3 month follow up on  hypertension, cholesterol, prediabetes, weight and vitamin D deficiency.   He was seen 09/24 for an insect bite to right foot, started on doxy and states it is improving, still red but no pain, able to walk on it.   Patient was diagnosed with colon cancer in Oct 2016, s/p resection 08/02/2015 by Dr. Barry Dienes, completed final cycle of chemotherapy with Dr. Benay Spice on 02/09/2016, following CEAs with Dr. Amedeo Plenty.   In 1981 he underwent PCA and has done well since. Negative Cardiolite (EF 86%) in 2008. GERD well controlled by current medications.  He has thoracic anerysm 4.5 cm and has seen Dr. Prescott Gum May 2021.   BMI is Body mass index is 23.24 kg/m., he has been working on diet and exercise, walks 30 min most days. Wt Readings from Last 3 Encounters:  07/04/20 162 lb (73.5 kg)  07/01/20 162 lb 9.6 oz (73.8 kg)  03/31/20 161 lb 12.8 oz (73.4 kg)   He is not on BP medication, doesn't check Bps home, today their BP is BP: 130/72  He does workout. He denies chest pain, shortness of breath, dizziness.   He is on cholesterol medication Atorvastatin 20 mg and denies myalgias. His cholesterol is at goal. The cholesterol last visit was:   Lab Results  Component Value Date   CHOL 139 03/31/2020   HDL 46 03/31/2020   LDLCALC 75 03/31/2020   TRIG 97 03/31/2020   CHOLHDL 3.0 03/31/2020    He has been working on diet and exercise for prediabetes, wife is strict with diet at home, and denies increased appetite, nausea, paresthesia of the feet, polydipsia, polyuria and visual disturbances. Last A1C in the office was:  Lab Results  Component Value Date   HGBA1C 5.7 (H) 03/31/2020   Patient is on Vitamin D supplement.   Lab Results  Component Value Date  VD25OH 72 03/31/2020        Current Medications:  Current Outpatient Medications on File Prior to Visit  Medication Sig  . aspirin 81 MG tablet Take 81 mg by mouth every morning.   Marland Kitchen atorvastatin (LIPITOR) 40 MG tablet Take 1/2  tablet Daily  for Cholesterol  . Cholecalciferol (VITAMIN D PO) Take 5,000 Int'l Units by mouth every morning.   Marland Kitchen doxycycline (VIBRAMYCIN) 100 MG capsule Take 1 capsule twice daily with food  . gabapentin (NEURONTIN) 100 MG capsule Take 1 capsule 3  x /day for pain  . Magnesium 250 MG TABS Take 1 tablet by mouth daily.  . Omega-3 Fatty Acids (FISH OIL PO) Take 1 tablet by mouth every morning.   . triamcinolone ointment (KENALOG) 0.1 % Apply 1 application topically 2 (two) times daily.   No current facility-administered medications on file prior to visit.     Allergies:  Allergies  Allergen Reactions  . Penicillins           Medical History:  Past Medical History:  Diagnosis Date  . Cancer ([place])    cancer of recto-sigmoid junction-dx 07/2015  . Elevated PSA   . GERD (gastroesophageal reflux disease)   . Hemorrhoids   . Hyperlipidemia   . Hypertension   . Pneumonia    age 78  . Vitamin D deficiency    Family history- Reviewed and unchanged Social history- Reviewed and unchanged   Review of Systems:  Review of Systems  Constitutional: Negative for malaise/fatigue and weight loss.  HENT: Negative for hearing loss and tinnitus.   Eyes: Negative for blurred vision and double vision.  Respiratory: Negative for cough, shortness of breath and wheezing.   Cardiovascular: Negative for chest pain, palpitations, orthopnea, claudication and leg swelling.  Gastrointestinal: Negative for abdominal pain, blood in stool, constipation, diarrhea, heartburn, melena, nausea and vomiting.  Genitourinary: Negative.   Musculoskeletal: Negative for joint pain and myalgias.  Skin: Negative for rash.  Neurological: Negative for dizziness, tingling, sensory change, weakness and headaches.  Endo/Heme/Allergies: Negative for polydipsia.  Psychiatric/Behavioral: Negative.   All other systems reviewed and are negative.     Physical Exam: BP 130/72   Pulse (!) 57   Temp (!) 97.3 F (36.3 C)   Wt 162  lb (73.5 kg)   SpO2 98%   BMI 23.24 kg/m  Wt Readings from Last 3 Encounters:  07/04/20 162 lb (73.5 kg)  07/01/20 162 lb 9.6 oz (73.8 kg)  03/31/20 161 lb 12.8 oz (73.4 kg)   General Appearance: Well nourished, in no apparent distress. Eyes: PERRLA, EOMs, conjunctiva no swelling or erythema Sinuses: No Frontal/maxillary tenderness ENT/Mouth: Ext aud canals clear, TMs without erythema, bulging. No erythema, swelling, or exudate on post pharynx.  Tonsils not swollen or erythematous. Hearing normal.  Neck: Supple, thyroid normal.  Respiratory: Respiratory effort normal, BS equal bilaterally without rales, rhonchi, wheezing or stridor.  Cardio: RRR with no MRGs. Brisk peripheral pulses without edema.  Abdomen: Soft, + BS.  Non tender, no guarding, rebound, hernias, masses. Lymphatics: Non tender without lymphadenopathy.  Musculoskeletal: Full ROM, 5/5 strength, Normal gait Skin: Warm, dry without rashes, lesions, ecchymosis.  Neuro: Cranial nerves intact. No cerebellar symptoms.  Psych: Awake and oriented X 3, normal affect, Insight and Judgment appropriate.    Vicie Mutters, PA-C 9:50 AM [facility]

## 2020-07-05 LAB — LIPID PANEL
Cholesterol: 145 mg/dL (ref <200)
HDL: 45 mg/dL (ref >=40)
LDL Cholesterol (Calc): 78 mg/dL (calc)
Non-HDL Cholesterol (Calc): 100 mg/dL (calc) (ref <130)
Total CHOL/HDL Ratio: 3.2 (calc) (ref <5.0)
Triglycerides: 127 mg/dL (ref <150)

## 2020-07-05 LAB — COMPLETE METABOLIC PANEL WITH GFR
AG Ratio: 1.7 (calc) (ref 1.0–2.5)
ALT: 13 U/L (ref 9–46)
AST: 18 U/L (ref 10–35)
Albumin: 3.8 g/dL (ref 3.6–5.1)
Alkaline phosphatase (APISO): 59 U/L (ref 35–144)
BUN: 15 mg/dL (ref 7–25)
CO2: 31 mmol/L (ref 20–32)
Calcium: 9.3 mg/dL (ref 8.6–10.3)
Chloride: 103 mmol/L (ref 98–110)
Creat: 0.74 mg/dL (ref 0.70–1.11)
GFR, Est African American: 97 mL/min/{1.73_m2} (ref >=60)
GFR, Est Non African American: 84 mL/min/{1.73_m2} (ref >=60)
Globulin: 2.2 g/dL (calc) (ref 1.9–3.7)
Glucose, Bld: 92 mg/dL (ref 65–99)
Potassium: 4.6 mmol/L (ref 3.5–5.3)
Sodium: 139 mmol/L (ref 135–146)
Total Bilirubin: 1.4 mg/dL — ABNORMAL HIGH (ref 0.2–1.2)
Total Protein: 6 g/dL — ABNORMAL LOW (ref 6.1–8.1)

## 2020-07-05 LAB — CBC WITH DIFFERENTIAL/PLATELET
Absolute Monocytes: 429 cells/uL (ref 200–950)
Basophils Absolute: 28 cells/uL (ref 0–200)
Basophils Relative: 0.5 %
Eosinophils Absolute: 209 cells/uL (ref 15–500)
Eosinophils Relative: 3.8 %
HCT: 44.8 % (ref 38.5–50.0)
Hemoglobin: 14.8 g/dL (ref 13.2–17.1)
Lymphs Abs: 1496 cells/uL (ref 850–3900)
MCH: 30.2 pg (ref 27.0–33.0)
MCHC: 33 g/dL (ref 32.0–36.0)
MCV: 91.4 fL (ref 80.0–100.0)
MPV: 11 fL (ref 7.5–12.5)
Monocytes Relative: 7.8 %
Neutro Abs: 3339 cells/uL (ref 1500–7800)
Neutrophils Relative %: 60.7 %
Platelets: 151 10*3/uL (ref 140–400)
RBC: 4.9 10*6/uL (ref 4.20–5.80)
RDW: 12.6 % (ref 11.0–15.0)
Total Lymphocyte: 27.2 %
WBC: 5.5 10*3/uL (ref 3.8–10.8)

## 2020-07-05 LAB — TSH: TSH: 1.38 mIU/L (ref 0.40–4.50)

## 2020-07-05 LAB — HEMOGLOBIN A1C
Hgb A1c MFr Bld: 5.6 % of total Hgb (ref <5.7)
Mean Plasma Glucose: 114 (calc)
eAG (mmol/L): 6.3 (calc)

## 2020-07-12 DIAGNOSIS — R69 Illness, unspecified: Secondary | ICD-10-CM | POA: Diagnosis not present

## 2020-07-29 DIAGNOSIS — H2511 Age-related nuclear cataract, right eye: Secondary | ICD-10-CM | POA: Diagnosis not present

## 2020-07-29 DIAGNOSIS — H25811 Combined forms of age-related cataract, right eye: Secondary | ICD-10-CM | POA: Diagnosis not present

## 2020-09-18 DIAGNOSIS — H2512 Age-related nuclear cataract, left eye: Secondary | ICD-10-CM | POA: Diagnosis not present

## 2020-09-23 DIAGNOSIS — H25812 Combined forms of age-related cataract, left eye: Secondary | ICD-10-CM | POA: Diagnosis not present

## 2020-09-23 DIAGNOSIS — H2512 Age-related nuclear cataract, left eye: Secondary | ICD-10-CM | POA: Diagnosis not present

## 2020-09-27 ENCOUNTER — Inpatient Hospital Stay: Payer: Medicare HMO | Attending: Oncology

## 2020-09-27 DIAGNOSIS — C187 Malignant neoplasm of sigmoid colon: Secondary | ICD-10-CM | POA: Insufficient documentation

## 2020-09-29 ENCOUNTER — Telehealth: Payer: Self-pay | Admitting: Oncology

## 2020-09-29 ENCOUNTER — Telehealth: Payer: Self-pay | Admitting: *Deleted

## 2020-09-29 NOTE — Telephone Encounter (Signed)
Left VM that he missed lab appointment on 12/21 and needs to reschedule. Scheduling message sent for reschedule request.

## 2020-09-29 NOTE — Telephone Encounter (Signed)
Rescheduled labs per 12/23 schedule message. Patient is aware.

## 2020-10-04 ENCOUNTER — Inpatient Hospital Stay: Payer: Medicare HMO

## 2020-10-04 ENCOUNTER — Other Ambulatory Visit: Payer: Self-pay

## 2020-10-04 DIAGNOSIS — C187 Malignant neoplasm of sigmoid colon: Secondary | ICD-10-CM | POA: Diagnosis not present

## 2020-10-04 LAB — CEA (IN HOUSE-CHCC): CEA (CHCC-In House): 1.56 ng/mL (ref 0.00–5.00)

## 2020-10-06 ENCOUNTER — Telehealth: Payer: Self-pay

## 2020-10-06 NOTE — Telephone Encounter (Signed)
-----   Message from Ladell Pier, MD sent at 10/04/2020  5:03 PM EST ----- Please call patient, CEA is normal, was supposed to have office visit, please schedule next few weeks

## 2020-10-06 NOTE — Telephone Encounter (Signed)
Called pt this morning left message to return call and receive lab result update  This nurse sent scheduling message for office visit

## 2020-10-06 NOTE — Telephone Encounter (Signed)
Called patient back and was provided CEA results. His f/u appointment has already been scheduled.

## 2020-10-12 ENCOUNTER — Inpatient Hospital Stay: Payer: Medicare HMO | Attending: Oncology | Admitting: Oncology

## 2020-10-12 ENCOUNTER — Other Ambulatory Visit: Payer: Self-pay

## 2020-10-12 VITALS — BP 148/60 | HR 56 | Temp 97.9°F | Resp 15 | Ht 70.0 in | Wt 161.4 lb

## 2020-10-12 DIAGNOSIS — I714 Abdominal aortic aneurysm, without rupture: Secondary | ICD-10-CM | POA: Insufficient documentation

## 2020-10-12 DIAGNOSIS — Z85038 Personal history of other malignant neoplasm of large intestine: Secondary | ICD-10-CM | POA: Diagnosis not present

## 2020-10-12 DIAGNOSIS — C187 Malignant neoplasm of sigmoid colon: Secondary | ICD-10-CM | POA: Diagnosis not present

## 2020-10-12 DIAGNOSIS — I2510 Atherosclerotic heart disease of native coronary artery without angina pectoris: Secondary | ICD-10-CM | POA: Diagnosis not present

## 2020-10-12 DIAGNOSIS — I712 Thoracic aortic aneurysm, without rupture: Secondary | ICD-10-CM | POA: Insufficient documentation

## 2020-10-12 NOTE — Progress Notes (Signed)
[place] OFFICE PROGRESS NOTE   Diagnosis: Colon cancer  INTERVAL HISTORY:   Keith [name] returns as scheduled.  He feels well.  No complaint.  He had bilateral cataract surgery last fall.  He has received the COVID-19 vaccines.  He underwent a virtual colonoscopy on 10/23/2019.  No polyps were seen.  He is being followed for an ascending thoracic aortic aneurysm and an infrarenal abdominal aortic aneurysm.  Objective:  Vital signs in last 24 hours:  Blood pressure (!) 148/60, pulse (!) 56, temperature 97.9 F (36.6 C), temperature source Tympanic, resp. rate 15, height _0  (1.778 m), weight 161 lb 6.4 oz (73.2 kg), SpO2 98 %.    Lymphatics: No cervical, supraclavicular, axillary, or inguinal nodes Resp: Lungs clear bilaterally Cardio: Regular rate and rhythm GI: No hepatosplenomegaly, no mass, nontender Vascular: No leg edema  Lab Results:  Lab Results  Component Value Date   WBC 5.5 07/04/2020   HGB 14.8 07/04/2020   HCT 44.8 07/04/2020   MCV 91.4 07/04/2020   PLT 151 07/04/2020   NEUTROABS 3,339 07/04/2020    CMP  Lab Results  Component Value Date   NA 139 07/04/2020   K 4.6 07/04/2020   CL 103 07/04/2020   CO2 31 07/04/2020   GLUCOSE 92 07/04/2020   BUN 15 07/04/2020   CREATININE 0.74 07/04/2020   CALCIUM 9.3 07/04/2020   PROT 6.0 (L) 07/04/2020   ALBUMIN 3.9 12/13/2016   AST 18 07/04/2020   ALT 13 07/04/2020   ALKPHOS 57 12/13/2016   BILITOT 1.4 (H) 07/04/2020   GFRNONAA 84 07/04/2020   GFRAA 97 07/04/2020    Lab Results  Component Value Date   CEA1 1.56 10/04/2020     Medications: I have reviewed the patient's current medications.   Assessment/Plan: 1. Adenocarcinoma at 20 cm from the anal verge, most consistent with sigmoid colon cancer ? Staging PET scan 07/05/2015 with no evidence of metastatic disease ? Endoscopic ultrasound 06/29/2015 consistent with a T2N0 tumor ? Status post a robotic low anterior resection  08/02/2015 with tumor confirmed above the peritoneal reflection, pT2 vs pT3,N1b, focally positive circumferential margin, no loss of mismatch repair protein expression ? Cycle 1 adjuvant Xeloda 09/02/2015 ? Cycle 2 adjuvant Xeloda 09/23/2015 ? Cycle 3 adjuvant Xeloda 10/14/2015; dose reduced to 1000 mg in the morning and 500 mg in the evening for 14 days followed by 7 days off (skin rash, hand-foot syndrome) ? Cycle 4 adjuvant Xeloda 11/04/2015 ? Cycle 5 adjuvant Xeloda 11/25/2015 ? Cycle 6 adjuvant Xeloda 12/16/2015 ? Cycle 7 adjuvant Xeloda 01/06/2016 ? Cycle 8 adjuvant Xeloda 01/27/2016 ? Colonoscopy 08/20/2016-few diverticula in the rectosigmoid colon. Repeat colonoscopy in 3 years for surveillance. ? CTs chest, abdomen, and pelvis 09/12/2016-negative for metastatic disease, stable right upper lobe nodule ? CTs 09/11/2017- resolution of a previously noted right upper lobe nodule, no evidence of metastatic disease ? CTs 09/12/2018- no evidence of metastatic disease ? Virtual colonoscopy 10/23/2019-no polyps seen ? CT 01/05/2020-interval growth of ascending thoracic aortic aneurysm and infrarenal abdominal aortic aneurysm, high-grade stenosis at the origin of the celiac axis  2. History of coronary artery disease  3. History ofMild hyperbilirubinemia -chronic, [place]?  4.   Thoracic and abdominal aortic aneurysms       Disposition: Keith [name] remains in clinical remission from colon cancer.  He is now greater than 5 years out from diagnosis.  He has a good prognosis for a long-term disease-free survival.  He would like to continue  follow-up at the Cancer center.  He will return for an office visit and CEA in 1 year.  [name], MD  10/12/2020  12:08 PM

## 2020-10-13 ENCOUNTER — Telehealth: Payer: Self-pay | Admitting: Oncology

## 2020-10-13 NOTE — Telephone Encounter (Signed)
Scheduled appointments per 1/5 los. Mailed updated calendar to patient.

## 2020-10-18 ENCOUNTER — Encounter: Payer: Self-pay | Admitting: Internal Medicine

## 2020-10-18 NOTE — Progress Notes (Signed)
Annual  Screening/Preventative Visit  & Comprehensive Evaluation & Examination     This very nice 87 y.o.  MWM presents for a Screening /Preventative Visit & comprehensive evaluation and management of multiple medical co-morbidities.  Patient has been followed for HTN, HLD, Prediabetes and Vitamin D Deficiency. Patient has GERD controlled by his meds. In Oct 2016, patient underwent Robotic rectosigmoid partial resection, treated with ChemoTx and followed by Dr Benay Spice with annual Abd CT scans.      Patient has been followed with labile HTN.   In 1981, he underwent PTCA ofa n ACS. In 2008, he had a Nl/Neg Stress Cardiolite.  Patient's BP has been controlled at home.  Today's BP is at goal - 132/64. Patient denies any cardiac symptoms as chest pain, palpitations, shortness of breath, dizziness or ankle swelling.Patient has Abd Ao Aneurysm by Abd CT scan  (Jan 2021) followed by Dr Trula Slade and thoracic Ao Aneurysm by CTA in Mar 2021. followed by Dr Lawson Fiscal.       Patient's hyperlipidemia is controlled with diet and medications. Patient denies myalgias or other medication SE's. Last lipids were at goal:  Lab Results  Component Value Date   CHOL 145 07/04/2020   HDL 45 07/04/2020   LDLCALC 78 07/04/2020   TRIG 127 07/04/2020   CHOLHDL 3.2 07/04/2020        Patient has hx/o prediabetes (A1c 5.9% /2014)and patient denies reactive hypoglycemic symptoms, visual blurring, diabetic polys or paresthesias. Last A1c was normal & at goal:   Lab Results  Component Value Date   HGBA1C 5.6 07/04/2020        Finally, patient has history of Vitamin D Deficiency ("27" /2009) and last vitamin D was at goal:   Lab Results  Component Value Date   VD25OH 72 03/31/2020    Current Outpatient Medications on File Prior to Visit  Medication Sig  . aspirin 81 MG tablet Take every morning.   Marland Kitchen atorvastatin 40 MG tablet Take 1/2  tablet Daily for Cholesterol  . VITAMIN D Take 5,000 Int'l Units every  morning.   . Magnesium 250 MG TABS Take 1 tablet daily.  . Omega-3 FISH OIL Take 1 tablet every morning.   . triamcinolone oint  0.1 % Apply 1 application topically 2  times daily.    Allergies  Allergen Reactions  . Penicillins     Past Medical History:  Diagnosis Date  . Cancer ([place])    cancer of recto-sigmoid junction-dx 07/2015  . Elevated PSA   . GERD    . Hemorrhoids   . Hyperlipidemia   . Hypertension   . Pneumonia    age 63  . Vitamin D deficiency    Health Maintenance  Topic Date Due  . COVID-19 Vaccine (3 - Pfizer risk 4-dose series) 12/16/2019  . TETANUS/TDAP  07/01/2030  . INFLUENZA VACCINE  Completed  . PNA vac Low Risk Adult  Completed   Immunization History  Administered Date(s) Administered  . Fluad Quad(high Dose 65+) 06/09/2019  . Influenza Split 07/29/2013, 06/22/2015  . Influenza, High Dose Seasonal PF 06/15/2014, 06/12/2016  . Influenza-Unspecified 06/04/2017, 06/10/2018, 06/17/2020  . PFIZER SARS-COV-2 Vaccination 10/27/2019, 11/18/2019  . Pneumococcal Conjugate-13 06/15/2014  . Pneumococcal Polysaccharide-23 03/12/2016  . Td 02/09/2010, 07/01/2020  . Zoster 10/08/2000   Last Colon    -  10/23/2019  - Virtual Colonoscopy  - Dr Paulita Fujita                       -  04/19/2016  - Dr Paulita Fujita - Colon s/p partial colectomy                     -  06/29/2015  - Flex Sig (+) Rectal Ca after (+) Cologard    -  08/02/2015  - Rob. Partial colectomy - Dr Barry Dienes  Past Surgical History:  Procedure Laterality Date  . EUS N/A 06/29/2015   Procedure: LOWER ENDOSCOPIC ULTRASOUND (EUS);  Surgeon: Arta Silence, MD;  Location: [facility];  Service: Endoscopy;  Laterality: N/A;  . HERNIA REPAIR     left inguinal  . XI ROBOTIC ASSISTED LOWER ANTERIOR RESECTION N/A 08/02/2015   Procedure: XI ROBOTIC ASSISTED LOWER ANTERIOR RESECTION;  Surgeon: Stark Klein, MD;  Location: [facility];  Service: General;  Laterality: N/A;    Social History    Socioeconomic History  . Marital status: Married    Spouse name: Pamala Hurry  . Number of children: 3 Children / 5 GrC     Occupational History  . Retired.  Tobacco Use  . Smoking status: Former Smoker    Quit date: 10/04/1965    Years since quitting: 55.0  . Smokeless tobacco: Never Used  Vaping Use  . Vaping Use: Never used  Substance and Sexual Activity  . Alcohol use: Yes    Alcohol/week: 0.0 standard drinks    Comment: rarely  . Drug use: No    ROS Constitutional: Denies fever, chills, weight loss/gain, headaches, insomnia,  night sweats or change in appetite. Does c/o fatigue. Eyes: Denies redness, blurred vision, diplopia, discharge, itchy or watery eyes.  ENT: Denies discharge, congestion, post nasal drip, epistaxis, sore throat, earache, hearing loss, dental pain, Tinnitus, Vertigo, Sinus pain or snoring.  Cardio: Denies chest pain, palpitations, irregular heartbeat, syncope, dyspnea, diaphoresis, orthopnea, PND, claudication or edema Respiratory: denies cough, dyspnea, DOE, pleurisy, hoarseness, laryngitis or wheezing.  Gastrointestinal: Denies dysphagia, heartburn, reflux, water brash, pain, cramps, nausea, vomiting, bloating, diarrhea, constipation, hematemesis, melena, hematochezia, jaundice or hemorrhoids Genitourinary: Denies dysuria, frequency, discharge, hematuria or flank pain. Has urgency, nocturia x 2-3 & occasional hesitancy. Musculoskeletal: Denies arthralgia, myalgia, stiffness, Jt. Swelling, pain, limp or strain/sprain. Denies Falls. Skin: Denies puritis, rash, hives, warts, acne, eczema or change in skin lesion Neuro: No weakness, tremor, incoordination, spasms, paresthesia or pain Psychiatric: Denies confusion, memory loss or sensory loss. Denies Depression. Endocrine: Denies change in weight, skin, hair change, nocturia, and paresthesia, diabetic polys, visual blurring or hyper / hypo glycemic episodes.  Heme/Lymph: No excessive bleeding, bruising or  enlarged lymph nodes.  Physical Exam  BP 132/64   Pulse (!) 54   Temp (!) 97.1 F (36.2 C)   Resp 16   Ht 5\' 10"  (1.778 m)   Wt 161 lb 9.6 oz (73.3 kg)   SpO2 99%   BMI 23.19 kg/m   General Appearance: Well nourished and well groomed and in no apparent distress.  Eyes: PERRLA, EOMs, conjunctiva no swelling or erythema, normal fundi and vessels. Sinuses: No frontal/maxillary tenderness ENT/Mouth: EACs patent / TMs  nl. Nares clear without erythema, swelling, mucoid exudates. Oral hygiene is good. No erythema, swelling, or exudate. Tongue normal, non-obstructing. Tonsils not swollen or erythematous. Hearing normal.  Neck: Supple, thyroid not palpable. No bruits, nodes or JVD. Respiratory: Respiratory effort normal.  BS equal and clear bilateral without rales, rhonci, wheezing or stridor. Cardio: Heart sounds are normal with regular rate and rhythm and no murmurs, rubs or gallops. Peripheral pulses are normal and equal bilaterally without edema.  No aortic or femoral bruits. Chest: symmetric with normal excursions and percussion.  Abdomen: Soft, with Nl bowel sounds. Nontender, no guarding, rebound, hernias, masses, or organomegaly.  Lymphatics: Non tender without lymphadenopathy.  Musculoskeletal: Full ROM all peripheral extremities, joint stability, 5/5 strength, and normal gait. Skin: Warm and dry without rashes, lesions, cyanosis, clubbing or  ecchymosis.  Neuro: Cranial nerves intact, reflexes equal bilaterally. Normal muscle tone, no cerebellar symptoms. Sensation intact.  Pysch: Alert and oriented X 3 with normal affect, insight and judgment appropriate.   Assessment and Plan  1. Annual Preventative/Screening Exam    2. Labile hypertension  - EKG 12-Lead - Urinalysis, Routine w reflex microscopic - Microalbumin / creatinine urine ratio - CBC with Differential/Platelet - COMPLETE METABOLIC PANEL WITH GFR - Magnesium - TSH  3. Hyperlipidemia, mixed  - EKG 12-Lead -  Lipid panel - TSH  4. Abnormal glucose  - EKG 12-Lead - Hemoglobin A1c - Insulin, random  5. Vitamin D deficiency  - VITAMIN D 25 Hydroxy  6. ASHD (arteriosclerotic heart disease)  - EKG 12-Lead - Lipid panel  7. Aortic aneurysm without rupture, unspecified portion of aorta (HCC)  - EKG 12-Lead - Lipid panel  8. BPH with obstruction/lower urinary tract symptoms  - PSA  9. Prostate cancer screening  - PSA  10. Screening for colorectal cancer  - POC Hemoccult Bld/Stl  11. Screening for ischemic heart disease  - EKG 12-Lead  12. FHx: heart disease  - EKG 12-Lead  13. Former smoker  - EKG 12-Lead  14. Medication management  - Urinalysis, Routine w reflex microscopic - Microalbumin / creatinine urine ratio - CBC with Differential/Platelet - COMPLETE METABOLIC PANEL WITH GFR - Magnesium - Lipid panel - TSH - Hemoglobin A1c - Insulin, random - VITAMIN D 25 Hydroxy        Patient was counseled in prudent diet, weight control to achieve/maintain BMI less than 25, BP monitoring, regular exercise and medications as discussed.  Discussed med effects and SE's. Routine screening labs and tests as requested with regular follow-up as recommended. Over 40 minutes of exam, counseling, chart review and high complex critical decision making was performed   Kirtland Bouchard, MD

## 2020-10-18 NOTE — Patient Instructions (Signed)

## 2020-10-19 ENCOUNTER — Other Ambulatory Visit: Payer: Self-pay

## 2020-10-19 ENCOUNTER — Ambulatory Visit (INDEPENDENT_AMBULATORY_CARE_PROVIDER_SITE_OTHER): Payer: Medicare HMO | Admitting: Internal Medicine

## 2020-10-19 VITALS — BP 132/64 | HR 54 | Temp 97.1°F | Resp 16 | Ht 70.0 in | Wt 161.6 lb

## 2020-10-19 DIAGNOSIS — I719 Aortic aneurysm of unspecified site, without rupture: Secondary | ICD-10-CM

## 2020-10-19 DIAGNOSIS — Z125 Encounter for screening for malignant neoplasm of prostate: Secondary | ICD-10-CM

## 2020-10-19 DIAGNOSIS — Z8249 Family history of ischemic heart disease and other diseases of the circulatory system: Secondary | ICD-10-CM

## 2020-10-19 DIAGNOSIS — Z0000 Encounter for general adult medical examination without abnormal findings: Secondary | ICD-10-CM | POA: Diagnosis not present

## 2020-10-19 DIAGNOSIS — R7309 Other abnormal glucose: Secondary | ICD-10-CM | POA: Diagnosis not present

## 2020-10-19 DIAGNOSIS — E559 Vitamin D deficiency, unspecified: Secondary | ICD-10-CM | POA: Diagnosis not present

## 2020-10-19 DIAGNOSIS — Z87891 Personal history of nicotine dependence: Secondary | ICD-10-CM

## 2020-10-19 DIAGNOSIS — Z1211 Encounter for screening for malignant neoplasm of colon: Secondary | ICD-10-CM

## 2020-10-19 DIAGNOSIS — N138 Other obstructive and reflux uropathy: Secondary | ICD-10-CM | POA: Diagnosis not present

## 2020-10-19 DIAGNOSIS — N401 Enlarged prostate with lower urinary tract symptoms: Secondary | ICD-10-CM | POA: Diagnosis not present

## 2020-10-19 DIAGNOSIS — R0989 Other specified symptoms and signs involving the circulatory and respiratory systems: Secondary | ICD-10-CM | POA: Diagnosis not present

## 2020-10-19 DIAGNOSIS — Z0001 Encounter for general adult medical examination with abnormal findings: Secondary | ICD-10-CM

## 2020-10-19 DIAGNOSIS — I700 Atherosclerosis of aorta: Secondary | ICD-10-CM

## 2020-10-19 DIAGNOSIS — I2510 Atherosclerotic heart disease of native coronary artery without angina pectoris: Secondary | ICD-10-CM

## 2020-10-19 DIAGNOSIS — E782 Mixed hyperlipidemia: Secondary | ICD-10-CM | POA: Diagnosis not present

## 2020-10-19 DIAGNOSIS — Z79899 Other long term (current) drug therapy: Secondary | ICD-10-CM

## 2020-10-19 DIAGNOSIS — Z136 Encounter for screening for cardiovascular disorders: Secondary | ICD-10-CM | POA: Diagnosis not present

## 2020-10-19 MED ORDER — IPRATROPIUM BROMIDE 0.06 % NA SOLN: NASAL | 3 refills | Status: DC

## 2020-10-19 NOTE — Addendum Note (Signed)
Addended by: Unk Pinto on: 10/19/2020 12:28 PM   Modules accepted: Orders

## 2020-10-20 LAB — COMPLETE METABOLIC PANEL WITH GFR
AG Ratio: 1.7 (calc) (ref 1.0–2.5)
ALT: 14 U/L (ref 9–46)
AST: 18 U/L (ref 10–35)
Albumin: 4.1 g/dL (ref 3.6–5.1)
Alkaline phosphatase (APISO): 61 U/L (ref 35–144)
BUN: 14 mg/dL (ref 7–25)
CO2: 31 mmol/L (ref 20–32)
Calcium: 9.4 mg/dL (ref 8.6–10.3)
Chloride: 103 mmol/L (ref 98–110)
Creat: 0.77 mg/dL (ref 0.70–1.11)
GFR, Est African American: 95 mL/min/{1.73_m2} (ref >=60)
GFR, Est Non African American: 82 mL/min/{1.73_m2} (ref >=60)
Globulin: 2.4 g/dL (calc) (ref 1.9–3.7)
Glucose, Bld: 101 mg/dL — ABNORMAL HIGH (ref 65–99)
Potassium: 4.5 mmol/L (ref 3.5–5.3)
Sodium: 140 mmol/L (ref 135–146)
Total Bilirubin: 1.6 mg/dL — ABNORMAL HIGH (ref 0.2–1.2)
Total Protein: 6.5 g/dL (ref 6.1–8.1)

## 2020-10-20 LAB — CBC WITH DIFFERENTIAL/PLATELET
Absolute Monocytes: 496 cells/uL (ref 200–950)
Basophils Absolute: 41 cells/uL (ref 0–200)
Basophils Relative: 0.7 %
Eosinophils Absolute: 159 cells/uL (ref 15–500)
Eosinophils Relative: 2.7 %
HCT: 46.2 % (ref 38.5–50.0)
Hemoglobin: 15.4 g/dL (ref 13.2–17.1)
Lymphs Abs: 1640 cells/uL (ref 850–3900)
MCH: 30 pg (ref 27.0–33.0)
MCHC: 33.3 g/dL (ref 32.0–36.0)
MCV: 89.9 fL (ref 80.0–100.0)
MPV: 10.9 fL (ref 7.5–12.5)
Monocytes Relative: 8.4 %
Neutro Abs: 3564 cells/uL (ref 1500–7800)
Neutrophils Relative %: 60.4 %
Platelets: 154 10*3/uL (ref 140–400)
RBC: 5.14 10*6/uL (ref 4.20–5.80)
RDW: 12.3 % (ref 11.0–15.0)
Total Lymphocyte: 27.8 %
WBC: 5.9 10*3/uL (ref 3.8–10.8)

## 2020-10-20 LAB — VITAMIN D 25 HYDROXY (VIT D DEFICIENCY, FRACTURES): Vit D, 25-Hydroxy: 69 ng/mL (ref 30–100)

## 2020-10-20 LAB — TSH: TSH: 1.98 mIU/L (ref 0.40–4.50)

## 2020-10-20 LAB — URINALYSIS, ROUTINE W REFLEX MICROSCOPIC
Bilirubin Urine: NEGATIVE
Glucose, UA: NEGATIVE
Hgb urine dipstick: NEGATIVE
Ketones, ur: NEGATIVE
Leukocytes,Ua: NEGATIVE
Nitrite: NEGATIVE
Protein, ur: NEGATIVE
Specific Gravity, Urine: 1.008 (ref 1.001–1.03)
pH: 6.5 (ref 5.0–8.0)

## 2020-10-20 LAB — LIPID PANEL
Cholesterol: 159 mg/dL (ref <200)
HDL: 46 mg/dL (ref >=40)
LDL Cholesterol (Calc): 86 mg/dL (calc)
Non-HDL Cholesterol (Calc): 113 mg/dL (calc) (ref <130)
Total CHOL/HDL Ratio: 3.5 (calc) (ref <5.0)
Triglycerides: 173 mg/dL — ABNORMAL HIGH (ref <150)

## 2020-10-20 LAB — HEMOGLOBIN A1C
Hgb A1c MFr Bld: 5.7 % of total Hgb — ABNORMAL HIGH (ref <5.7)
Mean Plasma Glucose: 117 mg/dL
eAG (mmol/L): 6.5 mmol/L

## 2020-10-20 LAB — PSA: PSA: 3 ng/mL (ref <=4.0)

## 2020-10-20 LAB — MICROALBUMIN / CREATININE URINE RATIO
Creatinine, Urine: 65 mg/dL (ref 20–320)
Microalb Creat Ratio: 6 mcg/mg creat (ref <30)
Microalb, Ur: 0.4 mg/dL

## 2020-10-20 LAB — MAGNESIUM: Magnesium: 2 mg/dL (ref 1.5–2.5)

## 2020-10-20 LAB — INSULIN, RANDOM: Insulin: 39.1 u[IU]/mL — ABNORMAL HIGH

## 2020-10-20 NOTE — Progress Notes (Signed)
========================================================== -   Test results slightly outside the reference range are not unusual. If there is anything important, I will review this with you,  otherwise it is considered normal test values.  If you have further questions,  please do not hesitate to contact me at the office or via My Chart.  ========================================================== ==========================================================  -  PSA is Low & Normal - great  ==========================================================  -  Total Chol = 159       &       LDL Chol = 86 -         Both  Excellent   - Very low risk for Heart Attack  / Stroke ========================================================  - A1c = 5.7% - Borderline high Normal Sugar  ==========================================================  -  Vitamin D = 69 - Excellent  ==========================================================  -  All Else - CBC - Kidneys - Electrolytes - Liver - Magnesium & Thyroid    - all  Normal / OK ===========================================================  ===========================================================[

## 2020-11-01 ENCOUNTER — Other Ambulatory Visit: Payer: Self-pay

## 2020-11-01 DIAGNOSIS — Z1211 Encounter for screening for malignant neoplasm of colon: Secondary | ICD-10-CM

## 2020-11-01 LAB — POC HEMOCCULT BLD/STL (HOME/3-CARD/SCREEN)
Card #2 Fecal Occult Blod, POC: NEGATIVE
Card #3 Fecal Occult Blood, POC: NEGATIVE
Fecal Occult Blood, POC: NEGATIVE

## 2020-11-02 DIAGNOSIS — Z1211 Encounter for screening for malignant neoplasm of colon: Secondary | ICD-10-CM | POA: Diagnosis not present

## 2020-11-02 DIAGNOSIS — Z1212 Encounter for screening for malignant neoplasm of rectum: Secondary | ICD-10-CM | POA: Diagnosis not present

## 2021-01-11 ENCOUNTER — Other Ambulatory Visit: Payer: Self-pay | Admitting: Surgery

## 2021-01-11 DIAGNOSIS — I712 Thoracic aortic aneurysm, without rupture, unspecified: Secondary | ICD-10-CM

## 2021-01-19 ENCOUNTER — Other Ambulatory Visit: Payer: Self-pay | Admitting: Internal Medicine

## 2021-01-19 DIAGNOSIS — E782 Mixed hyperlipidemia: Secondary | ICD-10-CM

## 2021-01-19 MED ORDER — ATORVASTATIN CALCIUM 20 MG PO TABS: ORAL_TABLET | ORAL | 3 refills | Status: DC

## 2021-01-19 NOTE — Progress Notes (Signed)
MEDICARE ANNUAL WELLNESS VISIT AND FOLLOW UP Assessment:   Encounter for Medicare annual wellness exam Annually   Atherosclerosis of aorta Per numerous CTs Control blood pressure, cholesterol, glucose, increase exercise.   Essential hypertension At goal off of medications at this time Monitor blood pressure at home; call if consistently over 130/80 Continue DASH diet.   Reminder to go to the ER if any CP, SOB, nausea, dizziness, severe HA, changes vision/speech, left arm numbness and tingling and jaw pain. - CBC with Differential - CMP/GFR - TSH  ASHD Control blood pressure, cholesterol, glucose, increase exercise.   Encounter for long-term (current) use of other medications - Magnesium  Hyperlipidemia -has been stable at goal, continue medications, check lipids, decrease fatty foods, increase activity.  PreDiabetes Discussed disease and risks Discussed diet/exercise, weight management  A1C, insulin level annually; monitor weight and serum glucose at routine OVs  Vitamin D deficiency At goal at recent check; continue to recommend supplementation for goal of 70-100 Defer vitamin D level  Gastroesophageal reflux disease without esophagitis Diet discussed, medications PRN  Cancer of sigmoid colon (HCC) Continue follow up Dr. Paulita Fujita and Dr. Ammie Dalton as recommended; monitor  Elevated PSA Checked at CPE  Allergic rhinitis - Allegra OTC, increase H20, allergy hygiene explained.  Aortic aneurysm ([place]) Thoracic ascending aorta - last imaged 12/2019, 4.2 cm, up from previous, overdue for follow up, discussed with patient who does with to pursue, CTA is scheduled Infrarenal - last imaging in 10/2019, 3.3 cm, recommended 3 year [place] follow up Control blood pressure, cholesterol, glucose    Future Appointments  Date Time Provider [place]  02/22/2021 12:00 PM [facility] 1 [facility]  02/22/2021  1:30 PM Bartle, Fernande Boyden, MD [facility]  05/08/2021  9:30 AM  Unk Pinto, MD [facility] None  11/02/2021  2:00 PM Unk Pinto, MD [facility] None  01/23/2022 11:30 AM [name], NP [facility] None      Plan:   During the course of the visit the patient was educated and counseled about appropriate screening and preventive services including:    Pneumococcal vaccine   Influenza vaccine  Td vaccine  Screening electrocardiogram  Colorectal cancer screening  Diabetes screening  Glaucoma screening  Nutrition counseling    Subjective:  Keith [name] is a 87 y.o. male who presents for Medicare Annual Wellness Visit and 3 month follow up for HTN, hyperlipidemia, prediabetes, and vitamin D Def.    Patient was diagnosed with colon cancer in Oct 2016, a/p resection 08/02/2015, completed final cycle of chemotherapy on 02/09/2016, monitoring by serial CTs by Dr. Benay Spice, had virtual colonoscopy CT in 10/2019 which was benign, has been released to PRN follow up only.   He has GERD recently well controlled; hasn't needed PRN medication in several months.   BMI is Body mass index is 23.39 kg/m., he has been working on diet and exercise, walks 30-40 min daily.  Wt Readings from Last 3 Encounters:  01/23/21 163 lb (73.9 kg)  10/19/20 161 lb 9.6 oz (73.3 kg)  10/12/20 161 lb 6.4 oz (73.2 kg)   His blood pressure has been controlled at home, today their BP: 128/62  He does workout, walks and goes to the [facility] . He had a normal stress test 2008, EF 86%, had PTCA in 1991.  He denies chest pain, shortness of breath, dizziness.   He has known aortic atherosclerosis per multiple CTs, thoracic ascending aortic aneurysm last imaged in 12/2019 with interval enlargement 4.0 < 4.2 cm, recommended  annual CTA/MRA, has scheduled 02/22/2021. Also with infrarenal [place], most recently seen on CT abd 10/2019 showing 3.3 cm infrarenal abdominal aortic aneurysm. Recommend followup by ultrasound in 3 years (due 11/2022).   He is on cholesterol medication  (atorvastatin 20 mg daily) and denies myalgias. His cholesterol is at goal. The cholesterol last visit was:   Lab Results  Component Value Date   CHOL 159 10/19/2020   HDL 46 10/19/2020   LDLCALC 86 10/19/2020   TRIG 173 (H) 10/19/2020   CHOLHDL 3.5 10/19/2020   He has been working on diet and exercise for hx of prediabetes (6.0 in 2016, 5.7 in 11/2018), and denies paresthesia of the feet, polydipsia and polyuria. Last A1C in the office was:  Lab Results  Component Value Date   HGBA1C 5.7 (H) 10/19/2020   Patient is on Vitamin D supplement.   Lab Results  Component Value Date   VD25OH 69 10/19/2020    He has hx of elevated PSAs checked annually at CPE:  Lab Results  Component Value Date   PSA 3.00 10/19/2020   PSA 3.0 09/23/2019   PSA 2.9 08/13/2018  CT pelvis 08/2018 showed hypertrophy but no concerning lesions, "unremarkable" per CT 10/2019    Medication Review: Current Outpatient Medications on File Prior to Visit  Medication Sig Dispense Refill  . aspirin 81 MG tablet Take 81 mg by mouth every morning.     Marland Kitchen atorvastatin (LIPITOR) 20 MG tablet Take  1 tablet  Daily  for Cholesterol 90 tablet 3  . Cholecalciferol (VITAMIN D PO) Take 5,000 Int'l Units by mouth every morning.     Marland Kitchen ipratropium (ATROVENT) 0.06 % nasal spray Use 1 to 2 sprays each nostril 2 to 3 x /day as needed 45 mL 3  . Magnesium 250 MG TABS Take 1 tablet by mouth daily.    . Omega-3 Fatty Acids (FISH OIL PO) Take 1 tablet by mouth every morning.      No current facility-administered medications on file prior to visit.    Current Problems (verified) Patient Active Problem List   Diagnosis Date Noted  . Aortic atherosclerosis ([place]) 12/23/2019  . Thoracic aortic aneurysm ([place]) 12/23/2019  . ASHD (arteriosclerotic heart disease) 08/13/2018  . History of rectal cancer 08/13/2018  . Non-seasonal allergic rhinitis 05/12/2018  . Colon cancer ([place]) 07/15/2015  . Prediabetes 09/14/2013  . Labile hypertension    . Hyperlipidemia, mixed   . Gastroesophageal reflux disease   . Vitamin D deficiency   . Elevated PSA     Screening Tests Immunization History  Administered Date(s) Administered  . Fluad Quad(high Dose 65+) 06/09/2019  . Influenza Split 07/29/2013, 06/22/2015  . Influenza, High Dose Seasonal PF 06/15/2014, 06/12/2016  . Influenza-Unspecified 06/04/2017, 06/10/2018, 06/17/2020  . PFIZER(Purple Top)SARS-COV-2 Vaccination 10/27/2019, 11/18/2019, 07/08/2020  . Pneumococcal Conjugate-13 06/15/2014  . Pneumococcal Polysaccharide-23 03/12/2016  . Td 02/09/2010, 07/01/2020  . Zoster 10/08/2000   Preventative care: Last colonoscopy: 08/20/2016, repeat 3 years - had virtual CT colonocopy in 10/2019, no further follow ups PET image 06/2015  CT chest: 13/2021 - aortic atherosclerosis, stable ascending aneurysm 4 cm CT abd/pelvis: 12/2019 -  infrarenal 3 cm  CT abd/virtual colonoscopy - 10/23/2019 3.3 cm infrarenal abdominal aortic aneurysm. Recommend followup by ultrasound in 3 years.   Prior vaccinations: TD or Tdap: 2011 defer boosting for now Influenza: 06/2020 Pneumococcal: 2004, 2017 Prevnar 13: 2015 Shingles/Zostavax: 2002  Covid 19: 3/3, 2021  Names of Other Physician/Practitioners you currently use: 1. [facility] here for primary care 1) Dr. Sherral Hammers, eye doctor, 2021,  2) Dr. Jone Baseman, last visit 2022 - goes q62m  Patient Care Team: Unk Pinto, MD as PCP - General (Internal Medicine) Dyke Maes, [place] (Optometry) Franchot Gallo, MD as Consulting Physician (Urology) Arsenio Loader, MD as Referring Physician (Orthopedic Surgery) Tania Ade, RN as Registered Nurse (Medical Oncology) Ladell Pier, MD as Consulting Physician (Oncology) Arta Silence, MD as Consulting Physician (Gastroenterology)   History reviewed: allergies, current medications, past family history, past medical history, past social history, past  surgical history and problem list  Family History  Problem Relation Age of Onset  . Heart disease Mother   . Heart disease Father    Past Surgical History:  Procedure Laterality Date  . CATARACT EXTRACTION, BILATERAL Bilateral 2021   Dr. Katy Fitch  . EUS N/A 06/29/2015   Procedure: LOWER ENDOSCOPIC ULTRASOUND (EUS);  Surgeon: Arta Silence, MD;  Location: [facility];  Service: Endoscopy;  Laterality: N/A;  . HERNIA REPAIR     left inguinal  . XI ROBOTIC ASSISTED LOWER ANTERIOR RESECTION N/A 08/02/2015   Procedure: XI ROBOTIC ASSISTED LOWER ANTERIOR RESECTION;  Surgeon: Stark Klein, MD;  Location: [facility];  Service: General;  Laterality: N/A;    MEDICARE WELLNESS OBJECTIVES: Physical activity: Current Exercise Habits: Home exercise routine, Type of exercise: walking, Time (Minutes): 35, Frequency (Times/Week): 6, Weekly Exercise (Minutes/Week): 210, Intensity: Mild, Exercise limited by: None identified Cardiac risk factors: Cardiac Risk Factors include: advanced age (>88men, >65 women);dyslipidemia;hypertension;male gender;smoking/ tobacco exposure Depression/mood screen:   Depression screen [facility] 2/9 01/23/2021  Decreased Interest 0  Down, Depressed, Hopeless 0  PHQ - 2 Score 0    ADLs:  In your present state of health, do you have any difficulty performing the following activities: 01/23/2021 10/18/2020  Hearing? N N  Vision? N N  Difficulty concentrating or making decisions? N N  Walking or climbing stairs? N N  Dressing or bathing? N N  Doing errands, shopping? N N  Some recent data might be hidden     Cognitive Testing  Alert? Yes  Normal Appearance?Yes  Oriented to person? Yes  Place? Yes   Time? Yes  Recall of three objects?  Yes  Can perform simple calculations? Yes  Displays appropriate judgment?Yes  Can read the correct time from a watch face?Yes  EOL planning: Does Patient Have a Medical Advance Directive?: Yes Type of Advance Directive: [place] will Does patient want to make changes to medical advance directive?: No - Patient declined Copy of [place] in Chart?: No - copy requested     Objective:   Blood pressure 128/62, pulse (!) 52, temperature 97.6 F (36.4 C), height 5\' 10"  (1.778 m), weight 163 lb (73.9 kg), SpO2 96 %. Body mass index is 23.39 kg/m.  General appearance: alert, no distress, WD/WN, male HEENT: normocephalic, sclerae anicteric, TMs pearly, nares patent, no discharge or erythema, pharynx normal Oral cavity: MMM, no lesions Neck: supple, no lymphadenopathy, no thyromegaly, no masses Heart: RRR, normal S1, S2, no murmurs Lungs: CTA bilaterally, no wheezes, rhonchi, or rales Abdomen: +bs, soft, non tender, non distended, no masses, no hepatomegaly, no splenomegaly Musculoskeletal: nontender, no swelling, no obvious deformity Extremities: no edema, no cyanosis, no clubbing Pulses: 2+ symmetric, upper and lower extremities, normal cap refill Neurological: alert, oriented x 3, CN2-12 intact, strength normal upper extremities and lower extremities, sensation normal throughout, DTRs 2+ throughout, no cerebellar signs, gait  normal Psychiatric: normal affect, behavior normal, pleasant   Medicare Attestation I have personally reviewed: The patient's medical and social history Their use of alcohol, tobacco or illicit drugs Their current medications and supplements The patient's functional ability including ADLs,fall risks, home safety risks, cognitive, and hearing and visual impairment Diet and physical activities Evidence for depression or mood disorders  The patient's weight, height, BMI, and visual acuity have been recorded in the chart.  I have made referrals, counseling, and provided education to the patient based on review of the above and I have provided the patient with a written personalized care plan for preventive services.     Keith Ribas, NP   01/23/2021

## 2021-01-23 ENCOUNTER — Ambulatory Visit (INDEPENDENT_AMBULATORY_CARE_PROVIDER_SITE_OTHER): Payer: Medicare HMO | Admitting: Adult Health

## 2021-01-23 ENCOUNTER — Other Ambulatory Visit: Payer: Self-pay

## 2021-01-23 ENCOUNTER — Encounter: Payer: Self-pay | Admitting: Adult Health

## 2021-01-23 VITALS — BP 128/62 | HR 52 | Temp 97.6°F | Ht 70.0 in | Wt 163.0 lb

## 2021-01-23 DIAGNOSIS — K219 Gastro-esophageal reflux disease without esophagitis: Secondary | ICD-10-CM

## 2021-01-23 DIAGNOSIS — R972 Elevated prostate specific antigen [PSA]: Secondary | ICD-10-CM | POA: Diagnosis not present

## 2021-01-23 DIAGNOSIS — C187 Malignant neoplasm of sigmoid colon: Secondary | ICD-10-CM

## 2021-01-23 DIAGNOSIS — I2510 Atherosclerotic heart disease of native coronary artery without angina pectoris: Secondary | ICD-10-CM

## 2021-01-23 DIAGNOSIS — R7303 Prediabetes: Secondary | ICD-10-CM | POA: Diagnosis not present

## 2021-01-23 DIAGNOSIS — I700 Atherosclerosis of aorta: Secondary | ICD-10-CM

## 2021-01-23 DIAGNOSIS — E782 Mixed hyperlipidemia: Secondary | ICD-10-CM | POA: Diagnosis not present

## 2021-01-23 DIAGNOSIS — I712 Thoracic aortic aneurysm, without rupture, unspecified: Secondary | ICD-10-CM

## 2021-01-23 DIAGNOSIS — Z0001 Encounter for general adult medical examination with abnormal findings: Secondary | ICD-10-CM

## 2021-01-23 DIAGNOSIS — E559 Vitamin D deficiency, unspecified: Secondary | ICD-10-CM

## 2021-01-23 DIAGNOSIS — R6889 Other general symptoms and signs: Secondary | ICD-10-CM

## 2021-01-23 DIAGNOSIS — Z0000 Encounter for general adult medical examination without abnormal findings: Secondary | ICD-10-CM

## 2021-01-23 DIAGNOSIS — Z6823 Body mass index (BMI) 23.0-23.9, adult: Secondary | ICD-10-CM

## 2021-01-23 DIAGNOSIS — R0989 Other specified symptoms and signs involving the circulatory and respiratory systems: Secondary | ICD-10-CM | POA: Diagnosis not present

## 2021-01-23 MED ORDER — TRIAMCINOLONE ACETONIDE 0.1 % EX OINT: 1.0000 "application " | TOPICAL_OINTMENT | Freq: Two times a day (BID) | CUTANEOUS | 3 refills | Status: DC

## 2021-01-23 NOTE — Patient Instructions (Signed)
  Keith [name] , Thank you for taking time to come for your Medicare Wellness Visit. I appreciate your ongoing commitment to your health goals. Please review the following plan we discussed and let me know if I can assist you in the future.   These are the goals we discussed: Goals    . Blood Pressure < 140/80    . LDL CALC < 130       This is a list of the screening recommended for you and due dates:  Health Maintenance  Topic Date Due  . COVID-19 Vaccine (4 - Booster for Pfizer series) 01/06/2021  . Flu Shot  05/08/2021  . Tetanus Vaccine  07/01/2030  . Pneumonia vaccines  Completed  . HPV Vaccine  Aged Out    Know what a healthy weight is for you (roughly BMI <25) and aim to maintain this  Aim for 7+ servings of fruits and vegetables daily  65-80+ fluid ounces of water or unsweet tea for healthy kidneys  Limit to max 1 drink of alcohol per day; avoid smoking/tobacco  Limit animal fats in diet for cholesterol and heart health - choose grass fed whenever available  Avoid highly processed foods, and foods high in saturated/trans fats  Aim for low stress - take time to unwind and care for your mental health  Aim for 150 min of moderate intensity exercise weekly for heart health, and weights twice weekly for bone health  Aim for 7-9 hours of sleep daily

## 2021-01-24 LAB — LIPID PANEL
Cholesterol: 145 mg/dL (ref <200)
HDL: 46 mg/dL (ref >=40)
LDL Cholesterol (Calc): 79 mg/dL (calc)
Non-HDL Cholesterol (Calc): 99 mg/dL (calc) (ref <130)
Total CHOL/HDL Ratio: 3.2 (calc) (ref <5.0)
Triglycerides: 123 mg/dL (ref <150)

## 2021-01-24 LAB — TSH: TSH: 1.87 mIU/L (ref 0.40–4.50)

## 2021-01-24 LAB — COMPLETE METABOLIC PANEL WITH GFR
AG Ratio: 1.8 (calc) (ref 1.0–2.5)
ALT: 13 U/L (ref 9–46)
AST: 19 U/L (ref 10–35)
Albumin: 4.1 g/dL (ref 3.6–5.1)
Alkaline phosphatase (APISO): 57 U/L (ref 35–144)
BUN: 15 mg/dL (ref 7–25)
CO2: 28 mmol/L (ref 20–32)
Calcium: 9.4 mg/dL (ref 8.6–10.3)
Chloride: 105 mmol/L (ref 98–110)
Creat: 0.71 mg/dL (ref 0.70–1.11)
GFR, Est African American: 98 mL/min/{1.73_m2} (ref >=60)
GFR, Est Non African American: 85 mL/min/{1.73_m2} (ref >=60)
Globulin: 2.3 g/dL (calc) (ref 1.9–3.7)
Glucose, Bld: 97 mg/dL (ref 65–99)
Potassium: 4.6 mmol/L (ref 3.5–5.3)
Sodium: 140 mmol/L (ref 135–146)
Total Bilirubin: 1.5 mg/dL — ABNORMAL HIGH (ref 0.2–1.2)
Total Protein: 6.4 g/dL (ref 6.1–8.1)

## 2021-01-24 LAB — CBC WITH DIFFERENTIAL/PLATELET
Absolute Monocytes: 543 cells/uL (ref 200–950)
Basophils Absolute: 28 cells/uL (ref 0–200)
Basophils Relative: 0.5 %
Eosinophils Absolute: 202 cells/uL (ref 15–500)
Eosinophils Relative: 3.6 %
HCT: 45 % (ref 38.5–50.0)
Hemoglobin: 14.8 g/dL (ref 13.2–17.1)
Lymphs Abs: 1602 cells/uL (ref 850–3900)
MCH: 29.9 pg (ref 27.0–33.0)
MCHC: 32.9 g/dL (ref 32.0–36.0)
MCV: 90.9 fL (ref 80.0–100.0)
MPV: 11.2 fL (ref 7.5–12.5)
Monocytes Relative: 9.7 %
Neutro Abs: 3226 cells/uL (ref 1500–7800)
Neutrophils Relative %: 57.6 %
Platelets: 153 10*3/uL (ref 140–400)
RBC: 4.95 10*6/uL (ref 4.20–5.80)
RDW: 12.6 % (ref 11.0–15.0)
Total Lymphocyte: 28.6 %
WBC: 5.6 10*3/uL (ref 3.8–10.8)

## 2021-01-24 LAB — MAGNESIUM: Magnesium: 2.1 mg/dL (ref 1.5–2.5)

## 2021-01-26 ENCOUNTER — Other Ambulatory Visit: Payer: Self-pay | Admitting: Internal Medicine

## 2021-01-26 DIAGNOSIS — E782 Mixed hyperlipidemia: Secondary | ICD-10-CM

## 2021-01-26 MED ORDER — ATORVASTATIN CALCIUM 20 MG PO TABS: ORAL_TABLET | ORAL | 3 refills | Status: DC

## 2021-02-16 ENCOUNTER — Other Ambulatory Visit: Payer: Self-pay | Admitting: *Deleted

## 2021-02-16 MED ORDER — IPRATROPIUM BROMIDE 0.06 % NA SOLN: NASAL | 3 refills | Status: DC

## 2021-02-17 DIAGNOSIS — J300 Vasomotor rhinitis: Secondary | ICD-10-CM | POA: Insufficient documentation

## 2021-02-22 ENCOUNTER — Ambulatory Visit (INDEPENDENT_AMBULATORY_CARE_PROVIDER_SITE_OTHER): Payer: Medicare HMO | Admitting: Surgery

## 2021-02-22 ENCOUNTER — Ambulatory Visit
Admission: RE | Admit: 2021-02-22 | Discharge: 2021-02-22 | Disposition: A | Discharge status: Home / Self Care | Payer: Medicare HMO | Source: Ambulatory Visit | Attending: Surgery | Admitting: Surgery

## 2021-02-22 ENCOUNTER — Encounter: Payer: Self-pay | Admitting: Surgery

## 2021-02-22 ENCOUNTER — Other Ambulatory Visit: Payer: Self-pay

## 2021-02-22 ENCOUNTER — Other Ambulatory Visit: Payer: Self-pay | Admitting: Surgery

## 2021-02-22 ENCOUNTER — Telehealth: Payer: Self-pay | Admitting: *Deleted

## 2021-02-22 VITALS — BP 160/81 | HR 71 | Resp 20 | Wt 161.0 lb

## 2021-02-22 DIAGNOSIS — I714 Abdominal aortic aneurysm, without rupture: Secondary | ICD-10-CM | POA: Diagnosis not present

## 2021-02-22 DIAGNOSIS — I712 Thoracic aortic aneurysm, without rupture, unspecified: Secondary | ICD-10-CM

## 2021-02-22 DIAGNOSIS — I708 Atherosclerosis of other arteries: Secondary | ICD-10-CM | POA: Diagnosis not present

## 2021-02-22 DIAGNOSIS — K8020 Calculus of gallbladder without cholecystitis without obstruction: Secondary | ICD-10-CM | POA: Diagnosis not present

## 2021-02-22 DIAGNOSIS — I774 Celiac artery compression syndrome: Secondary | ICD-10-CM | POA: Diagnosis not present

## 2021-02-22 DIAGNOSIS — N400 Enlarged prostate without lower urinary tract symptoms: Secondary | ICD-10-CM | POA: Diagnosis not present

## 2021-02-22 MED ORDER — IOPAMIDOL (ISOVUE-370) INJECTION 76%
75.0000 mL | Freq: Once | INTRAVENOUS | Status: AC | PRN
  Administered 2021-02-22: 75 mL via INTRAVENOUS

## 2021-02-22 NOTE — Telephone Encounter (Signed)
Ipratropium Bromide 0.06 % nasal spray approved by [employer]. Patient and pharmacy are aware.

## 2021-02-22 NOTE — Progress Notes (Signed)
HPI:  The patient is an 87 year old gentleman who presents for follow-up of a 4.5 cm fusiform ascending aortic aneurysm that has been stable for several years.  He also had a 3.2 cm abdominal aortic aneurysm followed by Dr. Trula Slade.  He was last seen by Dr. Darcey Nora on 02/24/2020.  He continues to do well and has no shortness of breath or chest pain.  He remains very active.  Current Outpatient Medications  Medication Sig Dispense Refill  . aspirin 81 MG tablet Take 81 mg by mouth every morning.     Marland Kitchen atorvastatin (LIPITOR) 20 MG tablet Take  1 tablet  Daily  for Cholesterol 90 tablet 3  . Cholecalciferol (VITAMIN D PO) Take 5,000 Int'l Units by mouth every morning.     Marland Kitchen ipratropium (ATROVENT) 0.06 % nasal spray Use 1 to 2 sprays each nostril 2 to 3 x /day as needed 45 mL 3  . Magnesium 250 MG TABS Take 1 tablet by mouth daily.    . Omega-3 Fatty Acids (FISH OIL PO) Take 1 tablet by mouth every morning.     . triamcinolone ointment (KENALOG) 0.1 % Apply 1 application topically 2 (two) times daily. 80 g 3   No current facility-administered medications for this visit.     Physical Exam: BP (!) 160/81 (BP Location: Left Arm, Patient Position: Sitting)   Pulse 71   Resp 20   Wt 161 lb (73 kg)   SpO2 94%   BMI 23.10 kg/m  He looks well. Cardiac exam shows a regular rate and rhythm with normal heart sounds.  There is no murmur. Lungs are clear. There is no peripheral edema.  Diagnostic Tests:  Narrative & Impression  CLINICAL DATA:  Thoracic aortic and abdominal aortic aneurysm.  EXAM: CT ANGIOGRAPHY CHEST, ABDOMEN AND PELVIS  TECHNIQUE: Non-contrast CT of the chest was initially obtained.  Multidetector CT imaging through the chest, abdomen and pelvis was performed using the standard protocol during bolus administration of intravenous contrast. Multiplanar reconstructed images and MIPs were obtained and reviewed to evaluate the vascular anatomy.  CONTRAST:  41mL  ISOVUE-370 IOPAMIDOL (ISOVUE-370) INJECTION 76%  COMPARISON:  January 05, 2020.  FINDINGS: CTA CHEST FINDINGS  Cardiovascular: Grossly stable 4.2 cm ascending thoracic aortic aneurysm is noted. No dissection is noted. Atherosclerosis of thoracic aorta is noted. Great vessels are widely patent without significant stenosis. Transverse aortic arch measures 2.5 cm. Proximal descending thoracic aorta measures 3.1 cm. Normal cardiac size. No pericardial effusion.  Mediastinum/Nodes: No enlarged mediastinal, hilar, or axillary lymph nodes. Thyroid gland, trachea, and esophagus demonstrate no significant findings.  Lungs/Pleura: Lungs are clear. No pleural effusion or pneumothorax.  Musculoskeletal: No chest wall abnormality. No acute or significant osseous findings.  Review of the MIP images confirms the above findings.  CTA ABDOMEN AND PELVIS FINDINGS  VASCULAR  Aorta: 3.5 cm infrarenal abdominal aortic aneurysm is noted without dissection. Atherosclerosis of abdominal aorta is noted.  Celiac: There is again noted severe stenosis involving the origin of the celiac artery.  SMA: Patent without evidence of aneurysm, dissection, vasculitis or significant stenosis.  Renals: Both renal arteries are patent without evidence of aneurysm, dissection, vasculitis, fibromuscular dysplasia or significant stenosis.  IMA: Patent without evidence of aneurysm, dissection, vasculitis or significant stenosis.  Inflow: Patent without evidence of aneurysm, dissection, vasculitis or significant stenosis.  Veins: No obvious venous abnormality within the limitations of this arterial phase study.  Review of the MIP images confirms the above findings.  NON-VASCULAR  Hepatobiliary: Minimal cholelithiasis is noted. No biliary dilatation is noted. The liver is unremarkable.  Pancreas: Unremarkable. No pancreatic ductal dilatation or surrounding inflammatory  changes.  Spleen: Normal in size without focal abnormality.  Adrenals/Urinary Tract: Adrenal glands appear normal. Bilateral renal cysts are noted. No hydronephrosis or renal obstruction is noted. No renal or ureteral calculi are noted. Urinary bladder is unremarkable.  Stomach/Bowel: The stomach appears normal. There is no evidence of bowel obstruction or inflammation.  Lymphatic: No adenopathy is noted.  Reproductive: Mild prostatic enlargement.  Other: No abdominal wall hernia or abnormality. No abdominopelvic ascites.  Musculoskeletal: No acute or significant osseous findings.  Review of the MIP images confirms the above findings.  IMPRESSION: Grossly stable 4.2 cm ascending thoracic aortic aneurysm. Recommend annual imaging followup by CTA or MRA. This recommendation follows 2010 ACCF/AHA/AATS/ACR/ASA/SCA/SCAI/SIR/STS/SVM Guidelines for the Diagnosis and Management of Patients with Thoracic Aortic Disease. Circulation. 2010; 121: H702-O378. Aortic aneurysm NOS (ICD10-I71.9).  3.5 cm infrarenal abdominal aortic aneurysm is noted. Recommend follow-up ultrasound every 2 years. This recommendation follows ACR consensus guidelines: White Paper of the ACR Incidental Findings Committee II on Vascular Findings. J Am Coll Radiol 2013; 10:789-794.  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  Minimal cholelithiasis.  Mild prostatic enlargement.  Aortic Atherosclerosis (ICD10-I70.0).   Electronically Signed   By: Marijo Conception M.D.   On: 02/22/2021 13:21      Impression:  This 87 year old gentleman has a stable 4.2 cm fusiform ascending aortic aneurysm that was measured at a slightly smaller size in his previous CTs.  It has been stable for least past 4 years.  It is well below the surgical threshold of 5.5 cm.  He also has a 3.5 cm infrarenal abdominal aortic aneurysm that has been stable.  This is also well below the surgical  threshold.  I reviewed the CT images with him and answered his questions.  I stressed the importance of continued good blood pressure control in preventing further enlargement and acute aortic dissection.  I think it is reasonable to repeat his CT in 2 years.  Plan:  He will return to see me in 2 years with a CTA of the chest, abdomen, and pelvis.  He will continue to follow-up with Dr. Trula Slade for his abdominal aortic aneurysm.  I spent 20 minutes performing this established patient evaluation and > 50% of this time was spent face to face counseling and coordinating the care of this patient's aortic aneurysm.    Gaye Pollack, MD [facility] [phone]

## 2021-04-11 DIAGNOSIS — K219 Gastro-esophageal reflux disease without esophagitis: Secondary | ICD-10-CM | POA: Diagnosis not present

## 2021-04-11 DIAGNOSIS — Z8249 Family history of ischemic heart disease and other diseases of the circulatory system: Secondary | ICD-10-CM | POA: Diagnosis not present

## 2021-04-11 DIAGNOSIS — R030 Elevated blood-pressure reading, without diagnosis of hypertension: Secondary | ICD-10-CM | POA: Diagnosis not present

## 2021-04-11 DIAGNOSIS — J309 Allergic rhinitis, unspecified: Secondary | ICD-10-CM | POA: Diagnosis not present

## 2021-04-11 DIAGNOSIS — I2510 Atherosclerotic heart disease of native coronary artery without angina pectoris: Secondary | ICD-10-CM | POA: Diagnosis not present

## 2021-04-11 DIAGNOSIS — Z7982 Long term (current) use of aspirin: Secondary | ICD-10-CM | POA: Diagnosis not present

## 2021-04-11 DIAGNOSIS — Z809 Family history of malignant neoplasm, unspecified: Secondary | ICD-10-CM | POA: Diagnosis not present

## 2021-04-11 DIAGNOSIS — I729 Aneurysm of unspecified site: Secondary | ICD-10-CM | POA: Diagnosis not present

## 2021-04-11 DIAGNOSIS — E785 Hyperlipidemia, unspecified: Secondary | ICD-10-CM | POA: Diagnosis not present

## 2021-04-11 DIAGNOSIS — Z7722 Contact with and (suspected) exposure to environmental tobacco smoke (acute) (chronic): Secondary | ICD-10-CM | POA: Diagnosis not present

## 2021-05-07 ENCOUNTER — Encounter: Payer: Self-pay | Admitting: Internal Medicine

## 2021-05-07 NOTE — Patient Instructions (Signed)

## 2021-05-07 NOTE — Progress Notes (Signed)
Future Appointments  Date Time Provider [place]  05/08/2021  9:30 AM Unk Pinto, MD [facility] None  11/02/2021  2:00 PM Unk Pinto, MD [facility] None  01/23/2022 11:30 AM [name], NP [facility] None   History of Present Illness:       This very nice 87 y.o. MWM  presents for 6 month follow up with HTN, Aortic Aneurysms, HLD, Pre-Diabetes and Vitamin D Deficiency.  Patient's GERD is controlled on his meds.  Patient is released now by Dr Benay Spice -ss/p 5 year surveillance post excision of a rectal Ca in 2016.        Patient is followed expectantly with  labile  HTN & BP has been controlled at home. Today's BP is at goal -  124/62.   In 1981, patient had PCA for ACS and in 2008, he had a Negative Cardiolite. Patient also is followed by CVTS Dr Cyndia Bent for a 4.5 cm Asc Ao Aneurysm and Dr Trula Slade  for a 3.2 cm AAA.  Patient has had no complaints of any cardiac type chest pain, palpitations, dyspnea Vertell Limber /PND, dizziness, claudication  or dependent edema.       Hyperlipidemia is controlled with diet & meds. Patient denies myalgias or other med SE's. Last Lipids were at goal:   Lab Results  Component Value Date   CHOL 145 01/23/2021   HDL 46 01/23/2021   LDLCALC 79 01/23/2021   TRIG 123 01/23/2021   CHOLHDL 3.2 01/23/2021     Also, the patient has history of PreDiabetes (A1c 5.9% /2014) and has had no symptoms of reactive hypoglycemia, diabetic polys, paresthesias or visual blurring.  Last A1c was near goal:  Lab Results  Component Value Date   HGBA1C 5.7 (H) 10/19/2020                                                         Further, the patient also has history of Vitamin D Deficiency ("27" /2009) and supplements vitamin D without any suspected side-effects. Last vitamin D was at goal:   Lab Results  Component Value Date   VD25OH 69 10/19/2020     Current Outpatient Medications on File Prior to Visit  Medication Sig   aspirin 81 MG tablet  Take 81 mg by mouth every morning.    atorvastatin (LIPITOR) 20 MG tablet Take  1 tablet  Daily  for Cholesterol   Cholecalciferol (VITAMIN D PO) Take 5,000 Int'l Units by mouth every morning.    ipratropium (ATROVENT) 0.06 % nasal spray Use 1 to 2 sprays each nostril 2 to 3 x /day as needed   Magnesium 250 MG TABS Take 1 tablet by mouth daily.   Omega-3 Fatty Acids (FISH OIL PO) Take 1 tablet by mouth every morning.    triamcinolone ointment (KENALOG) 0.1 % Apply 1 application topically 2 (two) times daily.     Allergies  Allergen Reactions   Penicillins     PMHx:   Past Medical History:  Diagnosis Date   Cancer ([place])    cancer of recto-sigmoid junction-dx 07/2015   Elevated PSA    GERD (gastroesophageal reflux disease)    Hemorrhoids    Hyperlipidemia    Hypertension    Pneumonia    age 46   Vitamin D deficiency      Immunization  History  Administered Date(s) Administered   Fluad Quad(high Dose 65+) 06/09/2019   Influenza Split 07/29/2013, 06/22/2015   Influenza, High Dose Seasonal PF 06/15/2014, 06/12/2016   Influenza-Unspecified 06/04/2017, 06/10/2018, 06/17/2020   PFIZER(Purple Top)SARS-COV-2 Vaccination 10/27/2019, 11/18/2019, 07/08/2020   Pneumococcal Conjugate-13 06/15/2014   Pneumococcal Polysaccharide-23 03/12/2016   Td 02/09/2010, 07/01/2020   Zoster, Live 10/08/2000     Past Surgical History:  Procedure Laterality Date   CATARACT EXTRACTION, BILATERAL Bilateral 2021   Dr. Katy Fitch   EUS N/A 06/29/2015   Procedure: LOWER ENDOSCOPIC ULTRASOUND (EUS);  Surgeon: Arta Silence, MD;  Location: [facility];  Service: Endoscopy;  Laterality: N/A;   HERNIA REPAIR     left inguinal   XI ROBOTIC ASSISTED LOWER ANTERIOR RESECTION N/A 08/02/2015   Procedure: XI ROBOTIC ASSISTED LOWER ANTERIOR RESECTION;  Surgeon: Stark Klein, MD;  Location: [facility];  Service: General;  Laterality: N/A;    FHx:    Reviewed / unchanged  SHx:    Reviewed / unchanged   Systems  Review:  Constitutional: Denies fever, chills, wt changes, headaches, insomnia, fatigue, night sweats, change in appetite. Eyes: Denies redness, blurred vision, diplopia, discharge, itchy, watery eyes.  ENT: Denies discharge, congestion, post nasal drip, epistaxis, sore throat, earache, hearing loss, dental pain, tinnitus, vertigo, sinus pain, snoring.  CV: Denies chest pain, palpitations, irregular heartbeat, syncope, dyspnea, diaphoresis, orthopnea, PND, claudication or edema. Respiratory: denies cough, dyspnea, DOE, pleurisy, hoarseness, laryngitis, wheezing.  Gastrointestinal: Denies dysphagia, odynophagia, heartburn, reflux, water brash, abdominal pain or cramps, nausea, vomiting, bloating, diarrhea, constipation, hematemesis, melena, hematochezia  or hemorrhoids. Genitourinary: Denies dysuria, frequency, urgency, nocturia, hesitancy, discharge, hematuria or flank pain. Musculoskeletal: Denies arthralgias, myalgias, stiffness, jt. swelling, pain, limping or strain/sprain.  Skin: Denies pruritus, rash, hives, warts, acne, eczema or change in skin lesion(s). Neuro: No weakness, tremor, incoordination, spasms, paresthesia or pain. Psychiatric: Denies confusion, memory loss or sensory loss. Endo: Denies change in weight, skin or hair change.  Heme/Lymph: No excessive bleeding, bruising or enlarged lymph nodes.  Physical Exam  BP 124/62   Pulse (!) 56   Temp (!) 97.1 F (36.2 C)   Resp 17   Ht 5\' 10"  (1.778 m)   Wt 165 lb 3.2 oz (74.9 kg)   SpO2 97%   BMI 23.70 kg/m   Appears  well nourished, well groomed  and in no distress.  Eyes: PERRLA, EOMs, conjunctiva no swelling or erythema. Sinuses: No frontal/maxillary tenderness ENT/Mouth: EAC's clear, TM's nl w/o erythema, bulging. Nares clear w/o erythema, swelling, exudates. Oropharynx clear without erythema or exudates. Oral hygiene is good. Tongue normal, non obstructing. Hearing intact.  Neck: Supple. Thyroid not palpable. Car 2+/2+  without bruits, nodes or JVD. Chest: Respirations nl with BS clear & equal w/o rales, rhonchi, wheezing or stridor.  Cor: Heart sounds normal w/ regular rate and rhythm without sig. murmurs, gallops, clicks or rubs. Peripheral pulses normal and equal  without edema.  Abdomen: Soft & bowel sounds normal. Non-tender w/o guarding, rebound, hernias, masses or organomegaly.  Lymphatics: Unremarkable.  Musculoskeletal: Full ROM all peripheral extremities, joint stability, 5/5 strength and normal gait.  Skin: Warm, dry without exposed rashes, lesions or ecchymosis apparent.  Neuro: Cranial nerves intact, reflexes equal bilaterally. Sensory-motor testing grossly intact. Tendon reflexes grossly intact.  Pysch: Alert & oriented x 3.  Insight and judgement nl & appropriate. No ideations.  Assessment and Plan:  1. Labile hypertension  - Continue medication, monitor blood pressure at home.  - Continue DASH diet.  Reminder to go to the ER if any CP,  SOB, nausea, dizziness, severe HA, changes vision/speech.   - CBC with Differential/Platelet - COMPLETE METABOLIC PANEL WITH GFR - Magnesium - TSH  2. Hyperlipidemia, mixed  - Continue diet/meds, exercise,& lifestyle modifications.  - Continue monitor periodic cholesterol/liver & renal functions     - Lipid panel - TSH  3. Abnormal glucose  - Continue diet, exercise  - Lifestyle modifications.  - Monitor appropriate labs   - Hemoglobin A1c - Insulin, random  4. Vitamin D deficiency  - Continue supplementation   - VITAMIN D 25 Hydroxy  5. ASHD (arteriosclerotic heart disease)  - Lipid panel  6. Aortic atherosclerosis ([place]) by Abd CTscan 2019  - Lipid panel  7. Medication management  - CBC with Differential/Platelet - COMPLETE METABOLIC PANEL WITH GFR - Magnesium - Lipid panel - TSH - Hemoglobin A1c - Insulin, random - VITAMIN D 25 Hydroxy          Discussed  regular exercise, BP monitoring, weight control to  achieve/maintain BMI less than 25 and discussed med and SE's. Recommended labs to assess and monitor clinical status with further disposition pending results of labs.  I discussed the assessment and treatment plan with the patient. The patient was provided an opportunity to ask questions and all were answered. The patient agreed with the plan and demonstrated an understanding of the instructions.  I provided over 30 minutes of exam, counseling, chart review and  complex critical decision making.         The patient was advised to call back or seek an in-person evaluation if the symptoms worsen or if the condition fails to improve as anticipated.   Kirtland Bouchard, MD

## 2021-05-08 ENCOUNTER — Ambulatory Visit (INDEPENDENT_AMBULATORY_CARE_PROVIDER_SITE_OTHER): Payer: Medicare HMO | Admitting: Internal Medicine

## 2021-05-08 ENCOUNTER — Other Ambulatory Visit: Payer: Self-pay

## 2021-05-08 VITALS — BP 124/62 | HR 56 | Temp 97.1°F | Resp 17 | Ht 70.0 in | Wt 165.2 lb

## 2021-05-08 DIAGNOSIS — E559 Vitamin D deficiency, unspecified: Secondary | ICD-10-CM | POA: Diagnosis not present

## 2021-05-08 DIAGNOSIS — E782 Mixed hyperlipidemia: Secondary | ICD-10-CM | POA: Diagnosis not present

## 2021-05-08 DIAGNOSIS — R7309 Other abnormal glucose: Secondary | ICD-10-CM

## 2021-05-08 DIAGNOSIS — R0989 Other specified symptoms and signs involving the circulatory and respiratory systems: Secondary | ICD-10-CM | POA: Diagnosis not present

## 2021-05-08 DIAGNOSIS — B0229 Other postherpetic nervous system involvement: Secondary | ICD-10-CM | POA: Diagnosis not present

## 2021-05-08 DIAGNOSIS — Z79899 Other long term (current) drug therapy: Secondary | ICD-10-CM | POA: Diagnosis not present

## 2021-05-08 DIAGNOSIS — I700 Atherosclerosis of aorta: Secondary | ICD-10-CM

## 2021-05-08 DIAGNOSIS — I2510 Atherosclerotic heart disease of native coronary artery without angina pectoris: Secondary | ICD-10-CM | POA: Diagnosis not present

## 2021-05-08 MED ORDER — AMITRIPTYLINE HCL 10 MG PO TABS: ORAL_TABLET | ORAL | 0 refills | Status: DC

## 2021-05-09 LAB — COMPLETE METABOLIC PANEL WITH GFR
AG Ratio: 1.7 (calc) (ref 1.0–2.5)
ALT: 16 U/L (ref 9–46)
AST: 18 U/L (ref 10–35)
Albumin: 3.7 g/dL (ref 3.6–5.1)
Alkaline phosphatase (APISO): 52 U/L (ref 35–144)
BUN: 16 mg/dL (ref 7–25)
CO2: 30 mmol/L (ref 20–32)
Calcium: 8.7 mg/dL (ref 8.6–10.3)
Chloride: 105 mmol/L (ref 98–110)
Creat: 0.7 mg/dL (ref 0.70–1.22)
Globulin: 2.2 g/dL (calc) (ref 1.9–3.7)
Glucose, Bld: 98 mg/dL (ref 65–99)
Potassium: 4.4 mmol/L (ref 3.5–5.3)
Sodium: 141 mmol/L (ref 135–146)
Total Bilirubin: 1.4 mg/dL — ABNORMAL HIGH (ref 0.2–1.2)
Total Protein: 5.9 g/dL — ABNORMAL LOW (ref 6.1–8.1)
eGFR: 89 mL/min/{1.73_m2} (ref >=60)

## 2021-05-09 LAB — CBC WITH DIFFERENTIAL/PLATELET
Absolute Monocytes: 422 cells/uL (ref 200–950)
Basophils Absolute: 38 cells/uL (ref 0–200)
Basophils Relative: 0.8 %
Eosinophils Absolute: 182 cells/uL (ref 15–500)
Eosinophils Relative: 3.8 %
HCT: 43.3 % (ref 38.5–50.0)
Hemoglobin: 14.3 g/dL (ref 13.2–17.1)
Lymphs Abs: 1344 cells/uL (ref 850–3900)
MCH: 30 pg (ref 27.0–33.0)
MCHC: 33 g/dL (ref 32.0–36.0)
MCV: 91 fL (ref 80.0–100.0)
MPV: 10.8 fL (ref 7.5–12.5)
Monocytes Relative: 8.8 %
Neutro Abs: 2813 cells/uL (ref 1500–7800)
Neutrophils Relative %: 58.6 %
Platelets: 148 10*3/uL (ref 140–400)
RBC: 4.76 10*6/uL (ref 4.20–5.80)
RDW: 12.5 % (ref 11.0–15.0)
Total Lymphocyte: 28 %
WBC: 4.8 10*3/uL (ref 3.8–10.8)

## 2021-05-09 LAB — LIPID PANEL
Cholesterol: 132 mg/dL (ref <200)
HDL: 46 mg/dL (ref >=40)
LDL Cholesterol (Calc): 67 mg/dL (calc)
Non-HDL Cholesterol (Calc): 86 mg/dL (calc) (ref <130)
Total CHOL/HDL Ratio: 2.9 (calc) (ref <5.0)
Triglycerides: 111 mg/dL (ref <150)

## 2021-05-09 LAB — INSULIN, RANDOM: Insulin: 49.3 u[IU]/mL — ABNORMAL HIGH

## 2021-05-09 LAB — HEMOGLOBIN A1C
Hgb A1c MFr Bld: 5.6 % of total Hgb (ref <5.7)
Mean Plasma Glucose: 114 mg/dL
eAG (mmol/L): 6.3 mmol/L

## 2021-05-09 LAB — MAGNESIUM: Magnesium: 1.8 mg/dL (ref 1.5–2.5)

## 2021-05-09 LAB — TSH: TSH: 1.64 mIU/L (ref 0.40–4.50)

## 2021-05-09 LAB — VITAMIN D 25 HYDROXY (VIT D DEFICIENCY, FRACTURES): Vit D, 25-Hydroxy: 74 ng/mL (ref 30–100)

## 2021-05-09 NOTE — Progress Notes (Signed)
============================================================ -   Test results slightly outside the reference range are not unusual. If there is anything important, I will review this with you,  otherwise it is considered normal test values.  If you have further questions,  please do not hesitate to contact me at the office or via My Chart.  ============================================================ ============================================================  -  Total Chol = 132  and LDL Chol  67    -   Both-  Excellent   - Very low risk for Heart Attack  / Stroke ============================================================ ============================================================  -   Magnesium  -   1.8   -  very  low- goal is betw 2.0 - 2.5,   - So..............Marland Kitchen  Recommend that you take  Magnesium 500 mg tablet daily   - also important to eat lots of  leafy green vegetables   - spinach - Kale - collards - greens - okra - asparagus  - broccoli - quinoa - squash - almonds   - black, red, white beans  -  peas - green beans ============================================================ ============================================================  -  A1c down slightly to 5.6% and back in the Normal non-Diabetic range ============================================================ ============================================================  -  Vitamin D = 74 - Excellent  !  ============================================================ ============================================================  -  All Else - CBC - Kidneys - Electrolytes - Liver - Magnesium & Thyroid    - all  Normal / OK ===========================================================   - Keep up the Great Work  ! ============================================================ ============================================================  -

## 2021-05-10 ENCOUNTER — Telehealth: Payer: Self-pay

## 2021-05-10 NOTE — Telephone Encounter (Signed)
Prior auth sent in and approved for Amitriptyline HCL tablets. Good through 10/07/2021

## 2021-08-02 NOTE — Progress Notes (Deleted)
FOLLOW UP Assessment:    Atherosclerosis of aorta Per numerous CTs Control blood pressure, cholesterol, glucose, increase exercise.   Essential hypertension At goal off of medications at this time Monitor blood pressure at home; call if consistently over 130/80 Continue DASH diet.   Reminder to go to the ER if any CP, SOB, nausea, dizziness, severe HA, changes vision/speech, left arm numbness and tingling and jaw pain. - CBC with Differential - CMP/GFR - UA routine with reflex microscopic -Microalbumin/creatinine urine ratio  ASHD Control blood pressure, cholesterol, glucose, increase exercise.   Hyperlipidemia -has been stable at goal, continue medications, check lipids, decrease fatty foods, increase activity.  PreDiabetes Discussed disease and risks Discussed diet/exercise, weight management  A1C, insulin level annually; monitor weight and serum glucose at routine OVs  Vitamin D deficiency At goal at recent check; continue to recommend supplementation for goal of 70-100 Defer vitamin D level  Gastroesophageal reflux disease without esophagitis Diet discussed, medications PRN  Aortic aneurysm ([name]) Thoracic ascending aorta - last imaged 12/2019, 4.2 cm, up from previous, overdue for follow up, discussed with patient who does with to pursue, CTA is scheduled Infrarenal - last imaging in 10/2019, 3.3 cm, recommended 3 year [place] follow up Control blood pressure, cholesterol, glucose  Medication management  Future Appointments  Date Time Provider [place]  08/08/2021  9:30 AM Magda Bernheim, NP [facility] None  11/02/2021  2:00 PM Unk Pinto, MD [facility] None  01/23/2022 11:30 AM [name], NP [facility] None    Subjective:  Keith [name] is a 87 y.o. male who presents for Medicare Annual Wellness Visit and 3 month follow up for HTN, hyperlipidemia, prediabetes, and vitamin D Def.    Patient was diagnosed with colon cancer in Oct 2016, a/p resection  08/02/2015, completed final cycle of chemotherapy on 02/09/2016, monitoring by serial CTs by Dr. Benay Spice, had virtual colonoscopy CT in 10/2019 which was benign, has been released to PRN follow up only.   He has GERD recently well controlled; hasn't needed PRN medication in several months.   BMI is There is no height or weight on file to calculate BMI., he has been working on diet and exercise, walks 30-40 min daily.  Wt Readings from Last 3 Encounters:  05/08/21 165 lb 3.2 oz (74.9 kg)  02/22/21 161 lb (73 kg)  01/23/21 163 lb (73.9 kg)   His blood pressure has been controlled at home, today their    He does workout, walks and goes to the [facility] . He had a normal stress test 2008, EF 86%, had PTCA in 1991.  He denies chest pain, shortness of breath, dizziness.   He has known aortic atherosclerosis per multiple CTs, thoracic ascending aortic aneurysm last imaged in 12/2019 with interval enlargement 4.0 < 4.2 cm, recommended annual CTA/MRA, has scheduled 02/22/2021. Also with infrarenal [place], most recently seen on CT abd 10/2019 showing 3.3 cm infrarenal abdominal aortic aneurysm. Recommend followup by ultrasound in 3 years (due 11/2022).   He is on cholesterol medication (atorvastatin 20 mg daily) and denies myalgias. His cholesterol is at goal. The cholesterol last visit was:   Lab Results  Component Value Date   CHOL 132 05/08/2021   HDL 46 05/08/2021   LDLCALC 67 05/08/2021   TRIG 111 05/08/2021   CHOLHDL 2.9 05/08/2021   He has been working on diet and exercise for hx of prediabetes (6.0 in 2016, 5.7 in 11/2018), and denies paresthesia of the feet, polydipsia and polyuria. Last A1C in  the office was:  Lab Results  Component Value Date   HGBA1C 5.6 05/08/2021   Patient is on Vitamin D supplement.   Lab Results  Component Value Date   VD25OH 74 05/08/2021    He has hx of elevated PSAs checked annually at CPE:  Lab Results  Component Value Date   PSA 3.00 10/19/2020   PSA 3.0 09/23/2019    PSA 2.9 08/13/2018  CT pelvis 08/2018 showed hypertrophy but no concerning lesions, "unremarkable" per CT 10/2019    Medication Review: Current Outpatient Medications on File Prior to Visit  Medication Sig Dispense Refill   amitriptyline (ELAVIL) 10 MG tablet Take  1 tablet  3 x /day  for Shingles Neuralgia Pain 90 tablet 0   aspirin 81 MG tablet Take 81 mg by mouth every morning.      atorvastatin (LIPITOR) 20 MG tablet Take  1 tablet  Daily  for Cholesterol 90 tablet 3   Cholecalciferol (VITAMIN D PO) Take 5,000 Int'l Units by mouth every morning.      ipratropium (ATROVENT) 0.06 % nasal spray Use 1 to 2 sprays each nostril 2 to 3 x /day as needed 45 mL 3   Magnesium 250 MG TABS Take 1 tablet by mouth daily.     Omega-3 Fatty Acids (FISH OIL PO) Take 1 tablet by mouth every morning.      triamcinolone ointment (KENALOG) 0.1 % Apply 1 application topically 2 (two) times daily. 80 g 3   No current facility-administered medications on file prior to visit.    Current Problems (verified) Patient Active Problem List   Diagnosis Date Noted   Chronic vasomotor rhinitis 02/17/2021   Aortic atherosclerosis ([place]) by Abd CTscan 2019 12/23/2019   Thoracic aortic aneurysm 12/23/2019   ASHD (arteriosclerotic heart disease) 08/13/2018   History of rectal cancer 08/13/2018   Non-seasonal allergic rhinitis 05/12/2018   Colon cancer ([place]) 07/15/2015   Prediabetes 09/14/2013   Labile hypertension    Hyperlipidemia, mixed    Gastroesophageal reflux disease    Vitamin D deficiency    Elevated PSA     Screening Tests Immunization History  Administered Date(s) Administered   Fluad Quad(high Dose 65+) 06/09/2019   Influenza Split 07/29/2013, 06/22/2015   Influenza, High Dose Seasonal PF 06/15/2014, 06/12/2016   Influenza-Unspecified 06/04/2017, 06/10/2018, 06/17/2020   Moderna Sars-Covid-2 Vaccination 06/14/2021   PFIZER(Purple Top)SARS-COV-2 Vaccination 10/27/2019, 11/18/2019, 07/08/2020    Pneumococcal Conjugate-13 06/15/2014   Pneumococcal Polysaccharide-23 03/12/2016   Td 02/09/2010, 07/01/2020   Zoster, Live 10/08/2000    Patient Care Team: Unk Pinto, MD as PCP - General (Internal Medicine) Dyke Maes, [place] (Optometry) Franchot Gallo, MD as Consulting Physician (Urology) Arsenio Loader, MD as Referring Physician (Orthopedic Surgery) Tania Ade, RN as Registered Nurse (Medical Oncology) Ladell Pier, MD as Consulting Physician (Oncology) Arta Silence, MD as Consulting Physician (Gastroenterology)   History reviewed: allergies, current medications, past family history, past medical history, past social history, past surgical history and problem list  Family History  Problem Relation Age of Onset   Heart disease Mother    Heart disease Father    Past Surgical History:  Procedure Laterality Date   CATARACT EXTRACTION, BILATERAL Bilateral 2021   Dr. Katy Fitch   EUS N/A 06/29/2015   Procedure: LOWER ENDOSCOPIC ULTRASOUND (EUS);  Surgeon: Arta Silence, MD;  Location: [facility];  Service: Endoscopy;  Laterality: N/A;   HERNIA REPAIR     left inguinal   XI ROBOTIC ASSISTED LOWER ANTERIOR RESECTION  N/A 08/02/2015   Procedure: XI ROBOTIC ASSISTED LOWER ANTERIOR RESECTION;  Surgeon: Stark Klein, MD;  Location: [facility];  Service: General;  Laterality: N/A;         Objective:   There were no vitals taken for this visit. There is no height or weight on file to calculate BMI.  General appearance: alert, no distress, WD/WN, male HEENT: normocephalic, sclerae anicteric, TMs pearly, nares patent, no discharge or erythema, pharynx normal Oral cavity: MMM, no lesions Neck: supple, no lymphadenopathy, no thyromegaly, no masses Heart: RRR, normal S1, S2, no murmurs Lungs: CTA bilaterally, no wheezes, rhonchi, or rales Abdomen: +bs, soft, non tender, non distended, no masses, no hepatomegaly, no splenomegaly Musculoskeletal: nontender, no  swelling, no obvious deformity Extremities: no edema, no cyanosis, no clubbing Pulses: 2+ symmetric, upper and lower extremities, normal cap refill Neurological: alert, oriented x 3, CN2-12 intact, strength normal upper extremities and lower extremities, sensation normal throughout, DTRs 2+ throughout, no cerebellar signs, gait normal Psychiatric: normal affect, behavior normal, pleasant      Magda Bernheim, NP   08/02/2021

## 2021-08-08 ENCOUNTER — Ambulatory Visit: Payer: Medicare HMO | Admitting: Nurse Practitioner

## 2021-09-28 ENCOUNTER — Other Ambulatory Visit: Payer: Self-pay | Admitting: Internal Medicine

## 2021-09-28 MED ORDER — BUTALBITAL-APAP-CAFFEINE 50-325-40 MG PO TABS: ORAL_TABLET | ORAL | 0 refills | Status: DC

## 2021-10-12 ENCOUNTER — Other Ambulatory Visit: Payer: Medicare HMO

## 2021-10-12 ENCOUNTER — Ambulatory Visit: Payer: Medicare HMO | Admitting: Nurse Practitioner

## 2021-11-02 ENCOUNTER — Encounter: Payer: Self-pay | Admitting: Internal Medicine

## 2021-11-02 ENCOUNTER — Other Ambulatory Visit: Payer: Self-pay

## 2021-11-02 ENCOUNTER — Ambulatory Visit (INDEPENDENT_AMBULATORY_CARE_PROVIDER_SITE_OTHER): Payer: Medicare HMO | Admitting: Internal Medicine

## 2021-11-02 VITALS — BP 128/68 | HR 55 | Temp 97.9°F | Resp 16 | Ht 70.0 in | Wt 163.6 lb

## 2021-11-02 DIAGNOSIS — I2510 Atherosclerotic heart disease of native coronary artery without angina pectoris: Secondary | ICD-10-CM | POA: Diagnosis not present

## 2021-11-02 DIAGNOSIS — E782 Mixed hyperlipidemia: Secondary | ICD-10-CM

## 2021-11-02 DIAGNOSIS — R0989 Other specified symptoms and signs involving the circulatory and respiratory systems: Secondary | ICD-10-CM

## 2021-11-02 DIAGNOSIS — R7309 Other abnormal glucose: Secondary | ICD-10-CM

## 2021-11-02 DIAGNOSIS — Z0001 Encounter for general adult medical examination with abnormal findings: Secondary | ICD-10-CM

## 2021-11-02 DIAGNOSIS — Z0000 Encounter for general adult medical examination without abnormal findings: Secondary | ICD-10-CM | POA: Diagnosis not present

## 2021-11-02 DIAGNOSIS — I7123 Aneurysm of the descending thoracic aorta, without rupture: Secondary | ICD-10-CM

## 2021-11-02 DIAGNOSIS — N138 Other obstructive and reflux uropathy: Secondary | ICD-10-CM

## 2021-11-02 DIAGNOSIS — E559 Vitamin D deficiency, unspecified: Secondary | ICD-10-CM | POA: Diagnosis not present

## 2021-11-02 DIAGNOSIS — Z125 Encounter for screening for malignant neoplasm of prostate: Secondary | ICD-10-CM

## 2021-11-02 DIAGNOSIS — Z79899 Other long term (current) drug therapy: Secondary | ICD-10-CM | POA: Diagnosis not present

## 2021-11-02 DIAGNOSIS — I700 Atherosclerosis of aorta: Secondary | ICD-10-CM

## 2021-11-02 DIAGNOSIS — Z1211 Encounter for screening for malignant neoplasm of colon: Secondary | ICD-10-CM

## 2021-11-02 DIAGNOSIS — Z87891 Personal history of nicotine dependence: Secondary | ICD-10-CM

## 2021-11-02 DIAGNOSIS — Z8249 Family history of ischemic heart disease and other diseases of the circulatory system: Secondary | ICD-10-CM | POA: Diagnosis not present

## 2021-11-02 DIAGNOSIS — N401 Enlarged prostate with lower urinary tract symptoms: Secondary | ICD-10-CM | POA: Diagnosis not present

## 2021-11-02 DIAGNOSIS — Z136 Encounter for screening for cardiovascular disorders: Secondary | ICD-10-CM | POA: Diagnosis not present

## 2021-11-02 NOTE — Progress Notes (Signed)
Annual  Screening/Preventative Visit  & Comprehensive Evaluation & Examination  Future Appointments  Date Time Provider Department  11/02/2021       CPE  2:00 PM Unk Pinto, MD [facility]  01/23/2022     Wellness 11:30 AM [name], NP [facility]  11/07/2022  2:00 PM Unk Pinto, MD [facility]            This very nice 88 y.o. MWM presents for a Screening /Preventative Visit & comprehensive evaluation and management of multiple medical co-morbidities.  Patient has been followed for HTN, HLD, Prediabetes and Vitamin D Deficiency. Patient's GERD is controlled on his meds.  In 2015, patient underwent robotic resection of a Rectosigmoid Ca followed with Chemotx and followed with annual CTs cans by Dr Benay Spice. Abd CT scan in 2021 showed AAA and patient also has a Thoracic Ao Aneurysm followed by [facility] .       Labile HTN predates circa 1980's.  In 1981 he underwent PCAfor ACS. Patient's BP has been controlled at home.  Today's BP  is at goal - 128/68. Patient denies any cardiac symptoms as chest pain, palpitations, shortness of breath, dizziness or ankle swelling.       Patient's hyperlipidemia is controlled with diet and Atorvastatin.  Patient denies myalgias or other medication SE's. Last lipids were at goal :  Lab Results  Component Value Date   CHOL 132 05/08/2021   HDL 46 05/08/2021   LDL 67 05/08/2021   TRIG 111 05/08/2021   CHOLHDL 2.9 05/08/2021         Patient has hx/o prediabetes (A1c 5.9% /2014) and patient denies reactive hypoglycemic symptoms, visual blurring, diabetic polys or paresthesias. Last A1c was normal & at goal :   Lab Results  Component Value Date   HGBA1C 5.6 05/08/2021      ("27" /2009)     Finally, patient has history of Vitamin D Deficiency ("27" /2009) and last vitamin D was at goal :   Lab Results  Component Value Date   VD25OH 74 05/08/2021    Current Outpatient Medications on File Prior to Visit  Medication Sig    amitriptyline 10 MG tablet Take  1 tablet  3 x /day  for Shingles Neuralgia Pain   aspirin 81 MG tablet Take every morning.    atorvastatin  20 MG tablet Take  1 tablet  Daily    FIORICET 5 Take 1 tablet  every 4 hrs  if needed    VITAMIN D   5,000 Units   Take very morning.    iATROVENT 0.06 % nasal spray Use 1 to 2 sprays each nostril 2 to 3 x /day as needed   Magnesium 250 MG TABS Take 1 tablet daily.   Omega-3 FISH OIL  Take 1 tablet very morning.    triamcinolone ointment 0.1 % Apply 1 application topically 2 (two) times daily.    Allergies  Allergen Reactions   Penicillins     Past Medical History:  Diagnosis Date   Cancer ([place])    cancer of recto-sigmoid junction-dx 07/2015   Elevated PSA    GERD (gastroesophageal reflux disease)    Hemorrhoids    Hyperlipidemia    Hypertension    Pneumonia    age 51   Vitamin D deficiency     Health Maintenance  Topic Date Due   Zoster Vaccines- Shingrix (1 of 2) Never done   INFLUENZA VACCINE  05/08/2021   COVID-19 Vaccine (5 - Booster for [employer]  series) 08/09/2021   TETANUS/TDAP  07/01/2030   Pneumonia Vaccine 30+ Years old  Completed   HPV VACCINES  Aged Out    Immunization History  Administered Date(s) Administered   Fluad Quad(high Dose ) 06/09/2019   Influenza Split 07/29/2013, 06/22/2015   Influenza, High Dose  06/15/2014, 06/12/2016   Influenza 06/04/2017, 06/10/2018, 06/17/2020   Moderna Sars-Covid-2 Vacc 06/14/2021   PFIZER SARS-COV-2 Vacc 10/27/2019, 11/18/2019, 07/08/2020   Pneumococcal C-13 06/15/2014   Pneumococcal -23 03/12/2016   Td 02/09/2010, 07/01/2020   Zoster, Live 10/08/2000   Last Colon    -  06/29/2015  - Flex Sig (+) Rectal Ca after (+) Cologard                        -  08/02/2015  - Robotic Partial colectomy - Dr Barry Dienes                        -  04/19/2016  - Dr Paulita Fujita - Colon s/p partial colectomy                         -  10/23/2019  - Virtual Colonoscopy  - Dr Paulita Fujita   Past Surgical  History:  Procedure Laterality Date   CATARACT EXTRACTION, BILATERAL Bilateral 2021   Dr. Katy Fitch   EUS N/A 06/29/2015   LOWER ENDOSCOPIC ULTRASOUND (EUS);  Arta Silence, MD   HERNIA REPAIR     left inguinal   XI ROBOTIC ASSISTED LOWER ANT  RESECTION N/A 08/02/2015   Procedure: XI ROBOTIC ASSISTED LOWER ANTERIOR RESECTION;  Stark Klein, MD     Family History  Problem Relation Age of Onset   Heart disease Mother    Heart disease Father      Social History   Tobacco Use   Smoking status: Former    Types: Cigarettes    Quit date: 10/04/1965    Years since quitting: 56.1   Smokeless tobacco: Never  Vaping Use   Vaping Use: Never used  Substance Use Topics   Alcohol use: Yes    Alcohol/week: 0.0 standard drinks    Comment: rarely   Drug use: No      ROS Constitutional: Denies fever, chills, weight loss/gain, headaches, insomnia,  night sweats or change in appetite. Does c/o fatigue. Eyes: Denies redness, blurred vision, diplopia, discharge, itchy or watery eyes.  ENT: Denies discharge, congestion, post nasal drip, epistaxis, sore throat, earache, hearing loss, dental pain, Tinnitus, Vertigo, Sinus pain or snoring.  Cardio: Denies chest pain, palpitations, irregular heartbeat, syncope, dyspnea, diaphoresis, orthopnea, PND, claudication or edema Respiratory: denies cough, dyspnea, DOE, pleurisy, hoarseness, laryngitis or wheezing.  Gastrointestinal: Denies dysphagia, heartburn, reflux, water brash, pain, cramps, nausea, vomiting, bloating, diarrhea, constipation, hematemesis, melena, hematochezia, jaundice or hemorrhoids Genitourinary: Denies dysuria, frequency, urgency, nocturia, hesitancy, discharge, hematuria or flank pain Musculoskeletal: Denies arthralgia, myalgia, stiffness, Jt. Swelling, pain, limp or strain/sprain. Denies Falls. Skin: Denies puritis, rash, hives, warts, acne, eczema or change in skin lesion Neuro: No weakness, tremor, incoordination, spasms,  paresthesia or pain Psychiatric: Denies confusion, memory loss or sensory loss. Denies Depression. Endocrine: Denies change in weight, skin, hair change, nocturia, and paresthesia, diabetic polys, visual blurring or hyper / hypo glycemic episodes.  Heme/Lymph: No excessive bleeding, bruising or enlarged lymph nodes.   Physical Exam  BP 128/68    Pulse (!) 55    Temp 97.9 F (36.6 C)  Resp 16    Ht 5\' 10"  (1.778 m)    Wt 163 lb 9.6 oz (74.2 kg)    SpO2 95%    BMI 23.47 kg/m   General Appearance: Well nourished and well groomed and in no apparent distress.  Eyes: PERRLA, EOMs, conjunctiva no swelling or erythema, normal fundi and vessels. Sinuses: No frontal/maxillary tenderness ENT/Mouth: EACs patent / TMs  nl. Nares clear without erythema, swelling, mucoid exudates. Oral hygiene is good. No erythema, swelling, or exudate. Tongue normal, non-obstructing. Tonsils not swollen or erythematous. Hearing normal.  Neck: Supple, thyroid not palpable. No bruits, nodes or JVD. Respiratory: Respiratory effort normal.  BS equal and clear bilateral without rales, rhonci, wheezing or stridor. Cardio: Heart sounds are normal with regular rate and rhythm and no murmurs, rubs or gallops. Peripheral pulses are normal and equal bilaterally without edema. No aortic or femoral bruits. Chest: symmetric with normal excursions and percussion.  Abdomen: Soft, with Nl bowel sounds. Nontender, no guarding, rebound, hernias, masses, or organomegaly.  Lymphatics: Non tender without lymphadenopathy.  Musculoskeletal: Full ROM all peripheral extremities, joint stability, 5/5 strength, and normal gait. Skin: Warm and dry without rashes, lesions, cyanosis, clubbing or  ecchymosis.  Neuro: Cranial nerves intact, reflexes equal bilaterally. Normal muscle tone, no cerebellar symptoms. Sensation intact.  Pysch: Alert and oriented X 3 with normal affect, insight and judgment appropriate.   Assessment and Plan  1. Annual  Preventative/Screening Exam    2. Labile hypertension  - EKG 12-Lead - [place], RETROPERITNL ABD,  LTD - Urinalysis, Routine w reflex microscopic - Microalbumin / creatinine urine ratio - CBC with Differential/Platelet - COMPLETE METABOLIC PANEL WITH GFR - Magnesium - TSH  3. Hyperlipidemia, mixed  - EKG 12-Lead - [place], RETROPERITNL ABD,  LTD - Lipid panel - TSH  4. Abnormal glucose  - EKG 12-Lead - [place], RETROPERITNL ABD,  LTD - Hemoglobin A1c - Insulin, random  5. Vitamin D deficiency  - VITAMIN D 25 Hydroxy   6. ASHD (arteriosclerotic heart disease)  - EKG 12-Lead - Lipid panel  7. Aortic atherosclerosis ([place]) by Abd CTscan 2019  - EKG 12-Lead - [place], RETROPERITNL ABD,  LTD - Lipid panel  8. Aneurysm of descending thoracic aorta without rupture  - EKG 12-Lead - [place], RETROPERITNL ABD,  LTD - Lipid panel  9. Screening for colorectal cancer  - POC Hemoccult Bld/Stl 10. BPH with obstruction/lower urinary tract symptoms  - PSA  11. Prostate cancer screening  - PSA  12. Screening for ischemic heart disease  - EKG 12-Lead  13. FHx: heart disease  - EKG 12-Lead - [place], RETROPERITNL ABD,  LTD  14. Former smoker  - [place], RETROPERITNL ABD,  [place]  15. Medication management  - CBC with Differential/Platelet - COMPLETE METABOLIC PANEL WITH GFR - Magnesium - Lipid panel - TSH - Hemoglobin A1c - Insulin, random - VITAMIN D 25 Hydroxy           Patient was counseled in prudent diet, weight control to achieve/maintain BMI less than 25, BP monitoring, regular exercise and medications as discussed.  Discussed med effects and SE's. Routine screening labs and tests as requested with regular follow-up as recommended. Over 40 minutes of exam, counseling, chart review and high complex critical decision making was performed   Kirtland Bouchard, MD

## 2021-11-02 NOTE — Patient Instructions (Signed)

## 2021-11-03 LAB — COMPLETE METABOLIC PANEL WITH GFR
AG Ratio: 1.6 (calc) (ref 1.0–2.5)
ALT: 16 U/L (ref 9–46)
AST: 20 U/L (ref 10–35)
Albumin: 3.9 g/dL (ref 3.6–5.1)
Alkaline phosphatase (APISO): 53 U/L (ref 35–144)
BUN: 18 mg/dL (ref 7–25)
CO2: 32 mmol/L (ref 20–32)
Calcium: 9.4 mg/dL (ref 8.6–10.3)
Chloride: 104 mmol/L (ref 98–110)
Creat: 0.74 mg/dL (ref 0.70–1.22)
Globulin: 2.5 g/dL (calc) (ref 1.9–3.7)
Glucose, Bld: 79 mg/dL (ref 65–99)
Potassium: 4.5 mmol/L (ref 3.5–5.3)
Sodium: 141 mmol/L (ref 135–146)
Total Bilirubin: 0.7 mg/dL (ref 0.2–1.2)
Total Protein: 6.4 g/dL (ref 6.1–8.1)
eGFR: 88 mL/min/{1.73_m2} (ref >=60)

## 2021-11-03 LAB — CBC WITH DIFFERENTIAL/PLATELET
Absolute Monocytes: 541 cells/uL (ref 200–950)
Basophils Absolute: 21 cells/uL (ref 0–200)
Basophils Relative: 0.4 %
Eosinophils Absolute: 198 cells/uL (ref 15–500)
Eosinophils Relative: 3.8 %
HCT: 43.7 % (ref 38.5–50.0)
Hemoglobin: 14.6 g/dL (ref 13.2–17.1)
Lymphs Abs: 1435 cells/uL (ref 850–3900)
MCH: 30.3 pg (ref 27.0–33.0)
MCHC: 33.4 g/dL (ref 32.0–36.0)
MCV: 90.7 fL (ref 80.0–100.0)
MPV: 11.1 fL (ref 7.5–12.5)
Monocytes Relative: 10.4 %
Neutro Abs: 3006 cells/uL (ref 1500–7800)
Neutrophils Relative %: 57.8 %
Platelets: 146 10*3/uL (ref 140–400)
RBC: 4.82 10*6/uL (ref 4.20–5.80)
RDW: 12.8 % (ref 11.0–15.0)
Total Lymphocyte: 27.6 %
WBC: 5.2 10*3/uL (ref 3.8–10.8)

## 2021-11-03 LAB — URINALYSIS, ROUTINE W REFLEX MICROSCOPIC
Bilirubin Urine: NEGATIVE
Glucose, UA: NEGATIVE
Hgb urine dipstick: NEGATIVE
Ketones, ur: NEGATIVE
Leukocytes,Ua: NEGATIVE
Nitrite: NEGATIVE
Protein, ur: NEGATIVE
Specific Gravity, Urine: 1.013 (ref 1.001–1.035)
pH: 6.5 (ref 5.0–8.0)

## 2021-11-03 LAB — HEMOGLOBIN A1C
Hgb A1c MFr Bld: 5.7 % of total Hgb — ABNORMAL HIGH (ref <5.7)
Mean Plasma Glucose: 117 mg/dL
eAG (mmol/L): 6.5 mmol/L

## 2021-11-03 LAB — MICROALBUMIN / CREATININE URINE RATIO
Creatinine, Urine: 50 mg/dL (ref 20–320)
Microalb Creat Ratio: 6 mcg/mg creat (ref <30)
Microalb, Ur: 0.3 mg/dL

## 2021-11-03 LAB — LIPID PANEL
Cholesterol: 158 mg/dL (ref <200)
HDL: 47 mg/dL (ref >=40)
LDL Cholesterol (Calc): 86 mg/dL (calc)
Non-HDL Cholesterol (Calc): 111 mg/dL (calc) (ref <130)
Total CHOL/HDL Ratio: 3.4 (calc) (ref <5.0)
Triglycerides: 150 mg/dL — ABNORMAL HIGH (ref <150)

## 2021-11-03 LAB — VITAMIN D 25 HYDROXY (VIT D DEFICIENCY, FRACTURES): Vit D, 25-Hydroxy: 71 ng/mL (ref 30–100)

## 2021-11-03 LAB — MAGNESIUM: Magnesium: 2.1 mg/dL (ref 1.5–2.5)

## 2021-11-03 LAB — PSA: PSA: 2.65 ng/mL (ref <=4.00)

## 2021-11-03 LAB — INSULIN, RANDOM: Insulin: 20.2 u[IU]/mL — ABNORMAL HIGH

## 2021-11-03 LAB — TSH: TSH: 1.51 mIU/L (ref 0.40–4.50)

## 2021-11-04 NOTE — Progress Notes (Signed)
=============================================================== °-   Test results slightly outside the reference range are not unusual. If there is anything important, I will review this with you,  otherwise it is considered normal test values.  If you have further questions,  please do not hesitate to contact me at the office or via My Chart.  =============================================================== ===============================================================  -  Total  Chol =    158     -  Great  !            (  Ideal  or  Goal is less than 180  !  )   - and   -   LDL  Chol =  86   - could be better              (  Ideal  or  Goal is less than 70  !  )   Excellent   - Very low risk for Heart Attack  / Stroke ============================================================ ============================================================  -  PSA - Low  -   Great !  =============================================================== ===============================================================  -  Vitamin D = 71 - Excellent !   Please continue dose same  =============================================================== ===============================================================  -  All Else - CBC - Kidneys - Electrolytes - Liver - Magnesium & Thyroid    - all  Normal / OK =============================================================== ===============================================================

## 2021-11-08 DIAGNOSIS — H35363 Drusen (degenerative) of macula, bilateral: Secondary | ICD-10-CM | POA: Diagnosis not present

## 2021-11-13 ENCOUNTER — Other Ambulatory Visit: Payer: Self-pay

## 2021-11-13 DIAGNOSIS — Z1211 Encounter for screening for malignant neoplasm of colon: Secondary | ICD-10-CM

## 2021-11-13 LAB — POC HEMOCCULT BLD/STL (HOME/3-CARD/SCREEN)
Card #2 Fecal Occult Blod, POC: NEGATIVE
Card #3 Fecal Occult Blood, POC: NEGATIVE
Fecal Occult Blood, POC: NEGATIVE

## 2021-11-14 DIAGNOSIS — Z1211 Encounter for screening for malignant neoplasm of colon: Secondary | ICD-10-CM

## 2021-11-14 DIAGNOSIS — Z1212 Encounter for screening for malignant neoplasm of rectum: Secondary | ICD-10-CM

## 2021-12-13 DIAGNOSIS — Z008 Encounter for other general examination: Secondary | ICD-10-CM | POA: Diagnosis not present

## 2021-12-13 DIAGNOSIS — Z87891 Personal history of nicotine dependence: Secondary | ICD-10-CM | POA: Diagnosis not present

## 2021-12-13 DIAGNOSIS — R030 Elevated blood-pressure reading, without diagnosis of hypertension: Secondary | ICD-10-CM | POA: Diagnosis not present

## 2021-12-13 DIAGNOSIS — M1990 Unspecified osteoarthritis, unspecified site: Secondary | ICD-10-CM | POA: Diagnosis not present

## 2021-12-13 DIAGNOSIS — J309 Allergic rhinitis, unspecified: Secondary | ICD-10-CM | POA: Diagnosis not present

## 2021-12-13 DIAGNOSIS — N529 Male erectile dysfunction, unspecified: Secondary | ICD-10-CM | POA: Diagnosis not present

## 2021-12-13 DIAGNOSIS — Z85038 Personal history of other malignant neoplasm of large intestine: Secondary | ICD-10-CM | POA: Diagnosis not present

## 2021-12-13 DIAGNOSIS — Z7982 Long term (current) use of aspirin: Secondary | ICD-10-CM | POA: Diagnosis not present

## 2021-12-13 DIAGNOSIS — I719 Aortic aneurysm of unspecified site, without rupture: Secondary | ICD-10-CM | POA: Diagnosis not present

## 2021-12-13 DIAGNOSIS — E785 Hyperlipidemia, unspecified: Secondary | ICD-10-CM | POA: Diagnosis not present

## 2021-12-13 DIAGNOSIS — R32 Unspecified urinary incontinence: Secondary | ICD-10-CM | POA: Diagnosis not present

## 2022-01-23 ENCOUNTER — Ambulatory Visit: Payer: Medicare HMO | Admitting: Adult Health

## 2022-01-29 ENCOUNTER — Other Ambulatory Visit: Payer: Self-pay | Admitting: Internal Medicine

## 2022-01-29 DIAGNOSIS — E782 Mixed hyperlipidemia: Secondary | ICD-10-CM

## 2022-02-08 ENCOUNTER — Ambulatory Visit (INDEPENDENT_AMBULATORY_CARE_PROVIDER_SITE_OTHER): Payer: Medicare HMO | Admitting: Nurse Practitioner

## 2022-02-08 ENCOUNTER — Encounter: Payer: Self-pay | Admitting: Nurse Practitioner

## 2022-02-08 VITALS — BP 132/50 | HR 59 | Temp 97.9°F | Wt 162.0 lb

## 2022-02-08 DIAGNOSIS — Z0000 Encounter for general adult medical examination without abnormal findings: Secondary | ICD-10-CM | POA: Diagnosis not present

## 2022-02-08 DIAGNOSIS — I712 Thoracic aortic aneurysm, without rupture, unspecified: Secondary | ICD-10-CM

## 2022-02-08 DIAGNOSIS — R972 Elevated prostate specific antigen [PSA]: Secondary | ICD-10-CM

## 2022-02-08 DIAGNOSIS — I2510 Atherosclerotic heart disease of native coronary artery without angina pectoris: Secondary | ICD-10-CM

## 2022-02-08 DIAGNOSIS — R0989 Other specified symptoms and signs involving the circulatory and respiratory systems: Secondary | ICD-10-CM

## 2022-02-08 DIAGNOSIS — E559 Vitamin D deficiency, unspecified: Secondary | ICD-10-CM

## 2022-02-08 DIAGNOSIS — J3089 Other allergic rhinitis: Secondary | ICD-10-CM

## 2022-02-08 DIAGNOSIS — E782 Mixed hyperlipidemia: Secondary | ICD-10-CM

## 2022-02-08 DIAGNOSIS — I700 Atherosclerosis of aorta: Secondary | ICD-10-CM

## 2022-02-08 DIAGNOSIS — K219 Gastro-esophageal reflux disease without esophagitis: Secondary | ICD-10-CM

## 2022-02-08 DIAGNOSIS — R7303 Prediabetes: Secondary | ICD-10-CM

## 2022-02-08 DIAGNOSIS — C187 Malignant neoplasm of sigmoid colon: Secondary | ICD-10-CM

## 2022-02-08 NOTE — Progress Notes (Signed)
MEDICARE ANNUAL WELLNESS VISIT AND FOLLOW UP ?Assessment:  ? ?Encounter for Medicare annual wellness exam ?Due Annually ?Health Maintenance Reviewed ?Healthy lifestyle reviewed and goals set ? ? ?1. Labile hypertension ?At goal off of medications at this time ?Monitor blood pressure at home; call if consistently over 130/80 ?Continue DASH diet.   ?Reminder to go to the ER if any CP, SOB, nausea, dizziness, severe HA, changes vision/speech, left arm numbness and tingling and jaw pain. ? ? ?2. ASHD (arteriosclerotic heart disease) ?Continue bASA ?Continue statin ? ?3. Aortic atherosclerosis ([place]) by Abd CTscan 2019 ?Per numerous CTs ?Control blood pressure, cholesterol, glucose, increase exercise.  ?Continue bASA ?Continue statin ? ?4. Thoracic aortic aneurysm without rupture, unspecified part ([place]) ?Continue to monitor  ? ?5. Non-seasonal allergic rhinitis, unspecified trigger ?Continue OTC antihistamine PRN ?Avoid triggers ? ?6. Gastroesophageal reflux disease, unspecified whether esophagitis present ?Avoid triggers. Monitor diet. ?Avoid lying down until 2 hr after meals. ?OTC medications/acid reducer PRN ? ? ?7. Malignant neoplasm of sigmoid colon ([place]) ?Continue follow up Dr. Paulita Fujita and Dr. Ammie Dalton as recommended; monitor ? ?8. Hyperlipidemia, mixed ?Continue statin ?Continue lifestyle modifications. ? ?9. Vitamin D deficiency ?At goal at recent check; continue to recommend supplementation for goal of 70-100 ?Defer vitamin D level ? ?10. Elevated PSA ?Checked at CPE ? ?11. Prediabetes ?Discussed disease and risks ?Discussed diet/exercise, weight management  ?A1C, insulin level annually;  ? ? ? ?Future Appointments  ?Date Time Provider [place]  ?05/14/2022  2:30 PM Unk Pinto, MD [facility] None  ?11/07/2022  2:00 PM Unk Pinto, MD [facility] None  ?02/11/2023  4:00 PM Aiden Rao, Kenney Houseman, NP [facility] None  ? ? ? ? ?Plan:  ? ?During the course of the visit the patient was educated and counseled about  appropriate screening and preventive services including:  ? ?Pneumococcal vaccine  ?Influenza vaccine ?Td vaccine ?Screening electrocardiogram ?Colorectal cancer screening ?Diabetes screening ?Glaucoma screening ?Nutrition counseling  ? ? ?Subjective:  ?Keith [name] is a 88 y.o. male who presents for Medicare Annual Wellness Visit and 3 month follow up for HTN, hyperlipidemia, prediabetes, and vitamin D Def.   ? ?Patient was diagnosed with colon cancer in Oct 2016, s/p resection 08/02/2015, completed final cycle of chemotherapy on 02/09/2016, monitoring by serial CTs by Dr. Ammie Dalton.  Continues to f/u PRN.  ? ?He has GERD recently well controlled via diet, avoiding triggers; hasn't needed PRN medication in several months.  ? ?BMI is Body mass index is 23.24 kg/m?., he has been working on diet and exercise, walks 30-40 min daily.  ?Wt Readings from Last 3 Encounters:  ?02/08/22 162 lb (73.5 kg)  ?11/02/21 163 lb 9.6 oz (74.2 kg)  ?05/08/21 165 lb 3.2 oz (74.9 kg)  ? ?His blood pressure has been controlled at home, today their BP: (!) 132/50  ?He does workout, walks and goes to the [facility] . He had a normal stress test 2008, EF 86%, had PTCA in 1991.  He denies chest pain, shortness of breath, dizziness.  ? ?He has known aortic atherosclerosis per multiple CTs, thoracic ascending aortic aneurysm last imaged in 12/2019 with interval enlargement 4.0 < 4.2 cm, recommended annual CTA/MRA. Also with infrarenal [place], most recently seen on CT abd 10/2019 showing 3.3 cm infrarenal abdominal aortic aneurysm. Recommend followup by ultrasound in 3 years (due 11/2022).  ? ?He is on cholesterol medication (atorvastatin 20 mg daily) and denies myalgias. His cholesterol is at goal. The cholesterol last visit was:   ?Lab Results  ?Component  Value Date  ? CHOL 158 11/02/2021  ? HDL 47 11/02/2021  ? [place] 86 11/02/2021  ? TRIG 150 (H) 11/02/2021  ? CHOLHDL 3.4 11/02/2021  ? ?He has been working on diet and exercise for hx of prediabetes  (6.0 in 2016, 5.7 in 11/2018), and denies paresthesia of the feet, polydipsia and polyuria. Last A1C in the office was:  ?Lab Results  ?Component Value Date  ? HGBA1C 5.7 (H) 11/02/2021  ? ?Patient is on Vitamin D supplement.   ?Lab Results  ?Component Value Date  ? VD25OH 71 11/02/2021  ? ? He has hx of elevated PSAs checked annually at CPE:  ?Lab Results  ?Component Value Date  ? PSA 2.65 11/02/2021  ? PSA 3.00 10/19/2020  ? PSA 3.0 09/23/2019  ?CT pelvis 08/2018 showed hypertrophy but no concerning lesions ?  ? ?Medication Review: ?Current Outpatient Medications on File Prior to Visit  ?Medication Sig Dispense Refill  ? aspirin 81 MG tablet Take 81 mg by mouth every morning.     ? atorvastatin (LIPITOR) 20 MG tablet TAKE 1 TABLET BY MOUTH ONCE DAILY FOR CHOLESTEROL 90 tablet 0  ? Cholecalciferol (VITAMIN D PO) Take 5,000 Int'l Units by mouth every morning.     ? ipratropium (ATROVENT) 0.06 % nasal spray Use 1 to 2 sprays each nostril 2 to 3 x /day as needed 45 mL 3  ? Magnesium 250 MG TABS Take 1 tablet by mouth daily.    ? Omega-3 Fatty Acids (FISH OIL PO) Take 1 tablet by mouth every morning.     ? triamcinolone ointment (KENALOG) 0.1 % Apply 1 application topically 2 (two) times daily. 80 g 3  ? amitriptyline (ELAVIL) 10 MG tablet Take  1 tablet  3 x /day  for Shingles Neuralgia Pain (Patient not taking: Reported on 11/02/2021) 90 tablet 0  ? butalbital-acetaminophen-caffeine (FIORICET) 50-325-40 MG tablet Take 1 tablet  every 4 hours  if needed for Tension Headache. (Patient not taking: Reported on 02/08/2022) 30 tablet 0  ? ?No current facility-administered medications on file prior to visit.  ? ? ?Current Problems (verified) ?Patient Active Problem List  ? Diagnosis Date Noted  ? Chronic vasomotor rhinitis 02/17/2021  ? Aortic atherosclerosis ([place]) by Abd CTscan 2019 12/23/2019  ? Thoracic aortic aneurysm ([place]) 12/23/2019  ? ASHD (arteriosclerotic heart disease) 08/13/2018  ? History of rectal cancer 08/13/2018   ? Non-seasonal allergic rhinitis 05/12/2018  ? Colon cancer ([place]) 07/15/2015  ? Prediabetes 09/14/2013  ? Labile hypertension   ? Hyperlipidemia, mixed   ? Gastroesophageal reflux disease   ? Vitamin D deficiency   ? Elevated PSA   ? ? ?Screening Tests ?Immunization History  ?Administered Date(s) Administered  ? Fluad Quad(high Dose 65+) 06/09/2019  ? Influenza Split 07/29/2013, 06/22/2015  ? Influenza, High Dose Seasonal PF 06/15/2014, 06/12/2016  ? Influenza-Unspecified 06/04/2017, 06/10/2018, 06/17/2020  ? Moderna Sars-Covid-2 Vaccination 06/14/2021  ? PFIZER(Purple Top)SARS-COV-2 Vaccination 10/27/2019, 11/18/2019, 07/08/2020  ? Pneumococcal Conjugate-13 06/15/2014  ? Pneumococcal Polysaccharide-23 03/12/2016  ? Td 02/09/2010, 07/01/2020  ? Zoster, Live 10/08/2000  ? ?Preventative care: ?Last colonoscopy: Virtual CT colonocopy in 10/2019, no further follow ups ? ? ?CT chest: 13/2021 - aortic atherosclerosis, stable ascending aneurysm 4 cm ?CT abd/pelvis: 12/2019 -  infrarenal 3 cm ? ?Prior vaccinations: ?TD or Tdap: 2011 defer boosting for now ?Influenza: 06/2021 ?Pneumococcal: 2004, 2017 ?Prevnar 13: 2015 ?Shingles/Zostavax: 2002  ?Covid 19: 3/3, 2021 ? ?Names of Other Physician/Practitioners you currently use: ?1. [facility] here for primary care ?1) Dr. Sherral Hammers, eye doctor, 2021,  ?2) Dr. Jone Baseman, last visit 2022 - goes q3m ? ?Patient Care Team: ?Unk Pinto, MD as PCP - General (Internal Medicine) ?Dyke Maes, OD (Optometry) ?Franchot Gallo, MD as Consulting Physician (Urology) ?Arsenio Loader, MD as Referring Physician (Orthopedic Surgery) ?Tania Ade, RN as Registered Nurse (Medical Oncology) ?Ladell Pier, MD as Consulting Physician (Oncology) ?Arta Silence, MD as Consulting Physician (Gastroenterology) ? ? ?History reviewed: allergies, current medications, past family history, past medical history, past social history, past surgical history and  problem list ? ?Family History  ?Problem Relation Age of Onset  ? Heart disease Mother   ? Heart disease Father   ? ?Past Surgical History:  ?Procedure Laterality Date  ? CATARACT EXTRACTION, BILATER

## 2022-04-30 ENCOUNTER — Other Ambulatory Visit: Payer: Self-pay | Admitting: Internal Medicine

## 2022-04-30 ENCOUNTER — Other Ambulatory Visit: Payer: Self-pay | Admitting: Nurse Practitioner

## 2022-04-30 DIAGNOSIS — E782 Mixed hyperlipidemia: Secondary | ICD-10-CM

## 2022-05-07 ENCOUNTER — Other Ambulatory Visit: Payer: Self-pay

## 2022-05-07 MED ORDER — TRIAMCINOLONE ACETONIDE 0.1 % EX OINT: 1.0000 | TOPICAL_OINTMENT | Freq: Two times a day (BID) | CUTANEOUS | 3 refills | Status: DC

## 2022-05-13 ENCOUNTER — Encounter: Payer: Self-pay | Admitting: Internal Medicine

## 2022-05-13 NOTE — Progress Notes (Signed)
Future Appointments  Date Time Provider Department  05/14/2022            3 mo ov  2:30 PM Unk Pinto, MD [facility]  11/07/2022            cpe  2:00 PM Unk Pinto, MD [facility]  02/11/2023          wellness  4:00 PM Darrol Jump, NP [facility]     History of Present Illness:       This very nice  88 y.o. [name]  presents for 6 month follow up with HTN, Aortic Aneurysms, HLD, Pre-Diabetes and Vitamin D Deficiency.  Patient's GERD is controlled on his meds.  Patient is released now by Dr Benay Spice -  s/p 5 year surveillance post excision of a rectal Ca in 2016.        Patient is followed expectantly with  labile  HTN & BP has been controlled at home. Today's BP is at goal -  128/68 .   In 1981, patient had PCA for ACS and in 2008, he had a Negative Cardiolite. Patient also is followed by [facility] / Dr Cyndia Bent for a 4.5 cm Asc Ao Aneurysm and Dr Trula Slade  for a 3.2 cm AAA.  Patient has had no complaints of any cardiac type chest pain, palpitations, dyspnea Vertell Limber /PND, dizziness, claudication  or dependent edema.       Hyperlipidemia is controlled with diet & meds. Patient denies myalgias or other med SE's. Last Lipids were at goal:   Lab Results  Component Value Date   CHOL 158 11/02/2021   HDL 47 11/02/2021   LDLCALC 86 11/02/2021   TRIG 150 (H) 11/02/2021   CHOLHDL 3.4 11/02/2021     Also, the patient has history of PreDiabetes (A1c 5.9% /2014) and has had no symptoms of reactive hypoglycemia, diabetic polys, paresthesias or visual blurring.  Last A1c was near goal:  Lab Results  Component Value Date   HGBA1C 5.7 (H) 11/02/2021                                                      Further, the patient also has history of Vitamin D Deficiency ( "27" /2009 ) and supplements vitamin D without any suspected side-effects. Last vitamin D was at goal:   Lab Results  Component Value Date   VD25OH 71 11/02/2021       Current Outpatient Medications on File Prior to Visit   Medication Sig   aspirin 81 MG tablet Take every morning.    atorvastatin 20 MG tablet Take  1 tablet  Daily     VITAMIN D 5,000  Units  Take every morning.    ipratropium (ATROVENT) 0.06 % nasal spray Use 1 to 2 sprays each nostril 2 to 3 x /day as needed   Magnesium 250 MG TABS Take 1 tablet daily.   Omega-3 FISH OIL  Take 1 tablet every morning.    triamcinolone ointment 0.1 % Apply 1 application topically 2 (two) times daily.     Allergies  Allergen Reactions   Penicillins     PMHx:   Past Medical History:  Diagnosis Date   Cancer ([place])    cancer of recto-sigmoid junction-dx 07/2015   Elevated PSA    GERD (gastroesophageal reflux disease)    Hemorrhoids  Hyperlipidemia    Hypertension    Pneumonia    age 39   Vitamin D deficiency      Immunization History  Administered Date(s) Administered   Fluad Quad  (high Dose) 06/09/2019   Influenza Split 07/29/2013, 06/22/2015   Influenza, High Dose  06/15/2014, 06/12/2016   Influenza-Unspecified 06/04/2017, 06/10/2018, 06/17/2020   PFIZER-SARS-COV-2 Vacc 10/27/2019, 11/18/2019, 07/08/2020   Pneumococcal -13 06/15/2014   Pneumococcal -23 03/12/2016   Td 02/09/2010, 07/01/2020   Zoster, Live 10/08/2000     Past Surgical History:  Procedure Laterality Date   CATARACT EXTRACTION, BILATERAL Bilateral 2021   Dr. Katy Fitch   EUS N/A 06/29/2015   Procedure: LOWER ENDOSCOPIC ULTRASOUND (EUS);  Surgeon: Arta Silence, MD;  Location: [facility];  Service: Endoscopy;  Laterality: N/A;   HERNIA REPAIR     left inguinal   XI ROBOTIC ASSISTED LOWER ANTERIOR RESECTION N/A 08/02/2015   Procedure: XI ROBOTIC ASSISTED LOWER ANTERIOR RESECTION;  Surgeon: Stark Klein, MD;  Location: [facility];  Service: General;  Laterality: N/A;    FHx:    Reviewed / unchanged  SHx:    Reviewed / unchanged   Systems Review:  Constitutional: Denies fever, chills, wt changes, headaches, insomnia, fatigue, night sweats, change in appetite. Eyes:  Denies redness, blurred vision, diplopia, discharge, itchy, watery eyes.  ENT: Denies discharge, congestion, post nasal drip, epistaxis, sore throat, earache, hearing loss, dental pain, tinnitus, vertigo, sinus pain, snoring.  CV: Denies chest pain, palpitations, irregular heartbeat, syncope, dyspnea, diaphoresis, orthopnea, PND, claudication or edema. Respiratory: denies cough, dyspnea, DOE, pleurisy, hoarseness, laryngitis, wheezing.  Gastrointestinal: Denies dysphagia, odynophagia, heartburn, reflux, water brash, abdominal pain or cramps, nausea, vomiting, bloating, diarrhea, constipation, hematemesis, melena, hematochezia  or hemorrhoids. Genitourinary: Denies dysuria, frequency, urgency, nocturia, hesitancy, discharge, hematuria or flank pain. Musculoskeletal: Denies arthralgias, myalgias, stiffness, jt. swelling, pain, limping or strain/sprain.  Skin: Denies pruritus, rash, hives, warts, acne, eczema or change in skin lesion(s). Neuro: No weakness, tremor, incoordination, spasms, paresthesia or pain. Psychiatric: Denies confusion, memory loss or sensory loss. Endo: Denies change in weight, skin or hair change.  Heme/Lymph: No excessive bleeding, bruising or enlarged lymph nodes.  Physical Exam  BP 128/68   Pulse 60   Temp 97.9 F (36.6 C)   Resp 17   Ht 5\' 10"  (1.778 m)   Wt 159 lb 3.2 oz (72.2 kg)   SpO2 97%   BMI 22.84 kg/m   Appears  well nourished, well groomed  and in no distress.  Eyes: PERRLA, EOMs, conjunctiva no swelling or erythema. Sinuses: No frontal/maxillary tenderness ENT/Mouth: EAC's clear, TM's nl w/o erythema, bulging. Nares clear w/o erythema, swelling, exudates. Oropharynx clear without erythema or exudates. Oral hygiene is good. Tongue normal, non obstructing. Hearing intact.  Neck: Supple. Thyroid not palpable. Car 2+/2+ without bruits, nodes or JVD. Chest: Respirations nl with BS clear & equal w/o rales, rhonchi, wheezing or stridor.  Cor: Heart sounds  normal w/ regular rate and rhythm without sig. murmurs, gallops, clicks or rubs. Peripheral pulses normal and equal  without edema.  Abdomen: Soft & bowel sounds normal. Non-tender w/o guarding, rebound, hernias, masses or organomegaly.  Lymphatics: Unremarkable.  Musculoskeletal: Full ROM all peripheral extremities, joint stability, 5/5 strength and normal gait.  Skin: Warm, dry without exposed rashes, lesions or ecchymosis apparent.  Neuro: Cranial nerves intact, reflexes equal bilaterally. Sensory-motor testing grossly intact. Tendon reflexes grossly intact.  Pysch: Alert & oriented x 3.  Insight and judgement nl & appropriate. No ideations.  Assessment and Plan:  1. Labile hypertension  - Continue medication, monitor blood pressure at home.  - Continue DASH diet.  Reminder to go to the ER if any CP,  SOB, nausea, dizziness, severe HA, changes vision/speech.   - CBC with Differential/Platelet - COMPLETE METABOLIC PANEL WITH GFR - Magnesium - TSH  2. Hyperlipidemia, mixed  - Continue diet/meds, exercise,& lifestyle modifications.  - Continue monitor periodic cholesterol/liver & renal functions     - Lipid panel - TSH  3. Abnormal glucose  - Continue diet, exercise  - Lifestyle modifications.  - Monitor appropriate labs   - Hemoglobin A1c - Insulin, random  4. Vitamin D deficiency  - Continue supplementation   - VITAMIN D 25 Hydroxy   5. ASHD (arteriosclerotic heart disease)  - Lipid panel  6. Aortic atherosclerosis ([place]) by Abd CTscan 2019  - Lipid panel  7. Medication management  - CBC with Differential/Platelet - COMPLETE METABOLIC PANEL WITH GFR - Magnesium - Lipid panel - TSH - Hemoglobin A1c - Insulin, random - VITAMIN D 25 Hydroxy          Discussed  regular exercise, BP monitoring, weight control to achieve/maintain BMI less than 25 and discussed med and SE's. Recommended labs to assess and monitor clinical status with further disposition  pending results of labs.  I discussed the assessment and treatment plan with the patient. The patient was provided an opportunity to ask questions and all were answered. The patient agreed with the plan and demonstrated an understanding of the instructions.  I provided over 30 minutes of exam, counseling, chart review and  complex critical decision making.         The patient was advised to call back or seek an in-person evaluation if the symptoms worsen or if the condition fails to improve as anticipated.   Kirtland Bouchard, MD

## 2022-05-13 NOTE — Patient Instructions (Signed)

## 2022-05-14 ENCOUNTER — Ambulatory Visit (INDEPENDENT_AMBULATORY_CARE_PROVIDER_SITE_OTHER): Payer: Medicare HMO | Admitting: Internal Medicine

## 2022-05-14 ENCOUNTER — Encounter: Payer: Self-pay | Admitting: Internal Medicine

## 2022-05-14 VITALS — BP 128/68 | HR 60 | Temp 97.9°F | Resp 17 | Ht 70.0 in | Wt 159.2 lb

## 2022-05-14 DIAGNOSIS — E782 Mixed hyperlipidemia: Secondary | ICD-10-CM

## 2022-05-14 DIAGNOSIS — I2510 Atherosclerotic heart disease of native coronary artery without angina pectoris: Secondary | ICD-10-CM

## 2022-05-14 DIAGNOSIS — R0989 Other specified symptoms and signs involving the circulatory and respiratory systems: Secondary | ICD-10-CM | POA: Diagnosis not present

## 2022-05-14 DIAGNOSIS — E559 Vitamin D deficiency, unspecified: Secondary | ICD-10-CM | POA: Diagnosis not present

## 2022-05-14 DIAGNOSIS — Z79899 Other long term (current) drug therapy: Secondary | ICD-10-CM

## 2022-05-14 DIAGNOSIS — R7309 Other abnormal glucose: Secondary | ICD-10-CM | POA: Diagnosis not present

## 2022-05-14 DIAGNOSIS — I700 Atherosclerosis of aorta: Secondary | ICD-10-CM | POA: Diagnosis not present

## 2022-05-15 LAB — CBC WITH DIFFERENTIAL/PLATELET
Absolute Monocytes: 459 cells/uL (ref 200–950)
Basophils Absolute: 32 cells/uL (ref 0–200)
Basophils Relative: 0.6 %
Eosinophils Absolute: 238 cells/uL (ref 15–500)
Eosinophils Relative: 4.4 %
HCT: 43.4 % (ref 38.5–50.0)
Hemoglobin: 15 g/dL (ref 13.2–17.1)
Lymphs Abs: 1447 cells/uL (ref 850–3900)
MCH: 30.6 pg (ref 27.0–33.0)
MCHC: 34.6 g/dL (ref 32.0–36.0)
MCV: 88.6 fL (ref 80.0–100.0)
MPV: 11.1 fL (ref 7.5–12.5)
Monocytes Relative: 8.5 %
Neutro Abs: 3224 cells/uL (ref 1500–7800)
Neutrophils Relative %: 59.7 %
Platelets: 144 10*3/uL (ref 140–400)
RBC: 4.9 10*6/uL (ref 4.20–5.80)
RDW: 12.4 % (ref 11.0–15.0)
Total Lymphocyte: 26.8 %
WBC: 5.4 10*3/uL (ref 3.8–10.8)

## 2022-05-15 LAB — LIPID PANEL
Cholesterol: 145 mg/dL (ref <200)
HDL: 46 mg/dL (ref >=40)
LDL Cholesterol (Calc): 77 mg/dL (calc)
Non-HDL Cholesterol (Calc): 99 mg/dL (calc) (ref <130)
Total CHOL/HDL Ratio: 3.2 (calc) (ref <5.0)
Triglycerides: 141 mg/dL (ref <150)

## 2022-05-15 LAB — COMPLETE METABOLIC PANEL WITH GFR
AG Ratio: 1.7 (calc) (ref 1.0–2.5)
ALT: 14 U/L (ref 9–46)
AST: 18 U/L (ref 10–35)
Albumin: 4 g/dL (ref 3.6–5.1)
Alkaline phosphatase (APISO): 58 U/L (ref 35–144)
BUN: 16 mg/dL (ref 7–25)
CO2: 30 mmol/L (ref 20–32)
Calcium: 9.1 mg/dL (ref 8.6–10.3)
Chloride: 103 mmol/L (ref 98–110)
Creat: 0.73 mg/dL (ref 0.70–1.22)
Globulin: 2.3 g/dL (calc) (ref 1.9–3.7)
Glucose, Bld: 89 mg/dL (ref 65–99)
Potassium: 4.5 mmol/L (ref 3.5–5.3)
Sodium: 139 mmol/L (ref 135–146)
Total Bilirubin: 1.4 mg/dL — ABNORMAL HIGH (ref 0.2–1.2)
Total Protein: 6.3 g/dL (ref 6.1–8.1)
eGFR: 88 mL/min/{1.73_m2} (ref >=60)

## 2022-05-15 LAB — VITAMIN D 25 HYDROXY (VIT D DEFICIENCY, FRACTURES): Vit D, 25-Hydroxy: 77 ng/mL (ref 30–100)

## 2022-05-15 LAB — INSULIN, RANDOM: Insulin: 25.5 u[IU]/mL — ABNORMAL HIGH

## 2022-05-15 LAB — MAGNESIUM: Magnesium: 2 mg/dL (ref 1.5–2.5)

## 2022-05-15 LAB — TSH: TSH: 1.68 mIU/L (ref 0.40–4.50)

## 2022-05-15 LAB — HEMOGLOBIN A1C
Hgb A1c MFr Bld: 5.6 % of total Hgb (ref <5.7)
Mean Plasma Glucose: 114 mg/dL
eAG (mmol/L): 6.3 mmol/L

## 2022-05-15 NOTE — Progress Notes (Signed)
<><><><><><><><><><><><><><><><><><><><><><><><><><><><><><><><><> <><><><><><><><><><><><><><><><><><><><><><><><><><><><><><><><><> -   Test results slightly outside the reference range are not unusual. If there is anything important, I will review this with you,  otherwise it is considered normal test values.  If you have further questions,  please do not hesitate to contact me at the office or via My Chart.  <><><><><><><><><><><><><><><><><><><><><><><><><><><><><><><><><> <><><><><><><><><><><><><><><><><><><><><><><><><><><><><><><><><>  -  A1c back to Normal  nonDiabetic range - Great ! <><><><><><><><><><><><><><><><><><><><><><><><><><><><><><><><><>  -  Vitamin D = 77 - Excellent  - Please keep dose same  <><><><><><><><><><><><><><><><><><><><><><><><><><><><><><><><><>  -  Total Chol = 145 - Very low risk -  Excellent   - Very low risk for Heart Attack  / Stroke <><><><><><><><><><><><><><><><><><><><><><><><><><><><><><><><><>  -  All Else - CBC - Kidneys - Electrolytes - Liver - Magnesium & Thyroid    -   all  Normal / OK <><><><><><><><><><><><><><><><><><><><><><><><><><><><><><><><><>

## 2022-07-28 ENCOUNTER — Other Ambulatory Visit: Payer: Self-pay | Admitting: Internal Medicine

## 2022-07-28 DIAGNOSIS — E782 Mixed hyperlipidemia: Secondary | ICD-10-CM

## 2022-07-28 MED ORDER — ATORVASTATIN CALCIUM 20 MG PO TABS: ORAL_TABLET | ORAL | 3 refills | Status: DC

## 2022-09-01 DIAGNOSIS — N390 Urinary tract infection, site not specified: Secondary | ICD-10-CM | POA: Diagnosis not present

## 2022-10-25 ENCOUNTER — Other Ambulatory Visit: Payer: Self-pay | Admitting: Internal Medicine

## 2022-10-25 DIAGNOSIS — E782 Mixed hyperlipidemia: Secondary | ICD-10-CM

## 2022-10-25 MED ORDER — ATORVASTATIN CALCIUM 20 MG PO TABS: ORAL_TABLET | ORAL | 3 refills | Status: DC

## 2022-11-07 ENCOUNTER — Ambulatory Visit (INDEPENDENT_AMBULATORY_CARE_PROVIDER_SITE_OTHER): Payer: Medicare HMO | Admitting: Internal Medicine

## 2022-11-07 ENCOUNTER — Encounter: Payer: Self-pay | Admitting: Internal Medicine

## 2022-11-07 VITALS — BP 138/64 | HR 58 | Temp 97.7°F | Resp 16 | Ht 70.0 in | Wt 161.6 lb

## 2022-11-07 DIAGNOSIS — E782 Mixed hyperlipidemia: Secondary | ICD-10-CM

## 2022-11-07 DIAGNOSIS — Z1211 Encounter for screening for malignant neoplasm of colon: Secondary | ICD-10-CM

## 2022-11-07 DIAGNOSIS — Z0001 Encounter for general adult medical examination with abnormal findings: Secondary | ICD-10-CM

## 2022-11-07 DIAGNOSIS — R7309 Other abnormal glucose: Secondary | ICD-10-CM | POA: Diagnosis not present

## 2022-11-07 DIAGNOSIS — R0989 Other specified symptoms and signs involving the circulatory and respiratory systems: Secondary | ICD-10-CM | POA: Diagnosis not present

## 2022-11-07 DIAGNOSIS — Z79899 Other long term (current) drug therapy: Secondary | ICD-10-CM | POA: Diagnosis not present

## 2022-11-07 DIAGNOSIS — I2510 Atherosclerotic heart disease of native coronary artery without angina pectoris: Secondary | ICD-10-CM

## 2022-11-07 DIAGNOSIS — Z0000 Encounter for general adult medical examination without abnormal findings: Secondary | ICD-10-CM | POA: Diagnosis not present

## 2022-11-07 DIAGNOSIS — N401 Enlarged prostate with lower urinary tract symptoms: Secondary | ICD-10-CM

## 2022-11-07 DIAGNOSIS — Z136 Encounter for screening for cardiovascular disorders: Secondary | ICD-10-CM | POA: Diagnosis not present

## 2022-11-07 DIAGNOSIS — Z125 Encounter for screening for malignant neoplasm of prostate: Secondary | ICD-10-CM | POA: Diagnosis not present

## 2022-11-07 DIAGNOSIS — I700 Atherosclerosis of aorta: Secondary | ICD-10-CM

## 2022-11-07 DIAGNOSIS — I7123 Aneurysm of the descending thoracic aorta, without rupture: Secondary | ICD-10-CM

## 2022-11-07 DIAGNOSIS — Z87891 Personal history of nicotine dependence: Secondary | ICD-10-CM

## 2022-11-07 DIAGNOSIS — Z8249 Family history of ischemic heart disease and other diseases of the circulatory system: Secondary | ICD-10-CM

## 2022-11-07 DIAGNOSIS — E559 Vitamin D deficiency, unspecified: Secondary | ICD-10-CM | POA: Diagnosis not present

## 2022-11-07 DIAGNOSIS — Z789 Other specified health status: Secondary | ICD-10-CM

## 2022-11-07 MED ORDER — IPRATROPIUM BROMIDE 0.06 % NA SOLN: NASAL | 3 refills | Status: DC

## 2022-11-07 NOTE — Patient Instructions (Signed)
Due to recent changes in healthcare laws, you may see the results of your imaging and laboratory studies on MyChart before your provider has had a chance to review them.  We understand that in some cases there may be results that are confusing or concerning to you. Not all laboratory results come back in the same time frame and the provider may be waiting for multiple results in order to interpret others.  Please give [place] 48 hours in order for your provider to thoroughly review all the results before contacting the office for clarification of your results.   +++++++++++++++++++++++++++++++  Vit D  & Vit C 1,000 mg   are recommended to help protect  against the Covid-19 and other Corona viruses.    Also it's recommended  to take  Zinc 50 mg  to help  protect against the Covid-19   and best place to get  is also on [email or web address]  and don't pay more than 6-8 cents /pill !  ================================ Coronavirus (COVID-19) Are you at risk?  Are you at risk for the Coronavirus (COVID-19)?  To be considered HIGH RISK for Coronavirus (COVID-19), you have to meet the following criteria:  Traveled to [place], [place], [place], [place] or [place]; or in the [place] to [place], [place], [place]  or [place]; and have fever, cough, and shortness of breath within the last 2 weeks of travel OR Been in close contact with a person diagnosed with COVID-19 within the last 2 weeks and have  fever, cough,and shortness of breath  IF YOU DO NOT MEET THESE CRITERIA, YOU ARE CONSIDERED LOW RISK FOR COVID-19.  What to do if you are HIGH RISK for COVID-19?  If you are having a medical emergency, call 911. Seek medical care right away. Before you go to a doctor's office, urgent care or emergency department,  call ahead and tell them about your recent travel, contact with someone diagnosed with COVID-19   and your symptoms.  You should receive instructions from your physician's office regarding  next steps of care.  When you arrive at healthcare provider, tell the healthcare staff immediately you have returned from  visiting [place], [place], [place], [place] or [place]; or traveled in the [place] to [place], [place],  [place] or [place] in the last two weeks or you have been in close contact with a person diagnosed with  COVID-19 in the last 2 weeks.   Tell the health care staff about your symptoms: fever, cough and shortness of breath. After you have been seen by a medical provider, you will be either: Tested for (COVID-19) and discharged home on quarantine except to seek medical care if  symptoms worsen, and asked to  Stay home and avoid contact with others until you get your results (4-5 days)  Avoid travel on public transportation if possible (such as bus, train, or airplane) or Sent to the Emergency Department by EMS for evaluation, COVID-19 testing  and  possible admission depending on your condition and test results.  What to do if you are LOW RISK for COVID-19?  Reduce your risk of any infection by using the same precautions used for avoiding the common cold or flu:  Wash your hands often with soap and warm water for at least 20 seconds.  If soap and water are not readily available,  use an alcohol-based hand sanitizer with at least 60% alcohol.  If coughing or sneezing, cover your mouth and nose by coughing  or sneezing into the elbow areas of your shirt or coat,  into a tissue or into your sleeve (not your hands). Avoid shaking hands with others and consider head nods or verbal greetings only. Avoid touching your eyes, nose, or mouth with unwashed hands.  Avoid close contact with people who are sick. Avoid places or events with large numbers of people in one location, like concerts or sporting events. Carefully consider travel plans you have or are making. If you are planning any travel outside or inside the [place], visit the CDC's Travelers' Health webpage for  the latest health notices. If you have some symptoms but not all symptoms, continue to monitor at home and seek medical attention  if your symptoms worsen. If you are having a medical emergency, call 911. >>>>>>>>>>>>>>>>>>>>>>>>>>>>>>>>>>>>>>>>>>>>>>>>>>> We Do NOT Approve of [employer] > > > > > > > > > > > > > > > > > > > > > > > > > > > > > > > > > > >  > >    Preventive Care for Adults  A healthy lifestyle and preventive care can promote health and wellness. Preventive health guidelines for men include the following key practices: A routine yearly physical is a good way to check with your health care provider about your health and preventative screening. It is a chance to share any concerns and updates on your health and to receive a thorough exam. Visit your dentist for a routine exam and preventative care every 6 months. Brush your teeth twice a day and floss once a day. Good oral hygiene prevents tooth decay and gum disease. The frequency of eye exams is based on your age, health, family medical history, use of contact lenses, and other factors. Follow your health care provider's recommendations for frequency of eye exams. Eat a healthy diet. Foods such as vegetables, fruits, whole grains, low-fat dairy products, and lean protein foods contain the nutrients you need without too many calories. Decrease your intake of foods high in solid fats, added sugars, and salt. Eat the right amount of calories for you. Get information about a proper diet from your health care provider, if necessary. Regular physical exercise is one of the most important things you can do for your health. Most adults should get at least 150 minutes of moderate-intensity exercise (any activity that increases your heart rate and causes you to sweat) each week. In addition, most adults need muscle-strengthening exercises on 2 or more days a week. Maintain a healthy weight. The body mass index (BMI) is a screening  tool to identify possible weight problems. It provides an estimate of body fat based on height and weight. Your health care provider can find your BMI and can help you achieve or maintain a healthy weight. For adults 20 years and older: A BMI below 18.5 is considered underweight. A BMI of 18.5 to 24.9 is normal. A BMI of 25 to 29.9 is considered overweight. A BMI of 30 and above is considered obese. Maintain normal blood lipids and cholesterol levels by exercising and minimizing your intake of saturated fat. Eat a balanced diet with plenty of fruit and vegetables. Blood tests for lipids and cholesterol should begin at age 58 and be repeated every 5 years. If your lipid or cholesterol levels are high, you are over 50, or you are at high risk for heart disease, you may need your cholesterol levels checked more frequently. Ongoing high lipid and cholesterol levels should be  treated with medicines if diet and exercise are not working. If you smoke, find out from your health care provider how to quit. If you do not use tobacco, do not start. Lung cancer screening is recommended for adults aged 47-80 years who are at high risk for developing lung cancer because of a history of smoking. A yearly low-dose CT scan of the lungs is recommended for people who have at least a 30-pack-year history of smoking and are a current smoker or have quit within the past 15 years. A pack year of smoking is smoking an average of 1 pack of cigarettes a day for 1 year (for example: 1 pack a day for 30 years or 2 packs a day for 15 years). Yearly screening should continue until the smoker has stopped smoking for at least 15 years. Yearly screening should be stopped for people who develop a health problem that would prevent them from having lung cancer treatment. If you choose to drink alcohol, do not have more than 2 drinks per day. One drink is considered to be 12 ounces (355 mL) of beer, 5 ounces (148 mL) of wine, or 1.5 ounces (44  mL) of liquor. Avoid use of street drugs. Do not share needles with anyone. Ask for help if you need support or instructions about stopping the use of drugs. High blood pressure causes heart disease and increases the risk of stroke. Your blood pressure should be checked at least every 1-2 years. Ongoing high blood pressure should be treated with medicines, if weight loss and exercise are not effective. If you are 33-66 years old, ask your health care provider if you should take aspirin to prevent heart disease. Diabetes screening involves taking a blood sample to check your fasting blood sugar level. Testing should be considered at a younger age or be carried out more frequently if you are overweight and have at least 1 risk factor for diabetes. Colorectal cancer can be detected and often prevented. Most routine colorectal cancer screening begins at the age of 21 and continues through age 56. However, your health care provider may recommend screening at an earlier age if you have risk factors for colon cancer. On a yearly basis, your health care provider may provide home test kits to check for hidden blood in the stool. Use of a small camera at the end of a tube to directly examine the colon (sigmoidoscopy or colonoscopy) can detect the earliest forms of colorectal cancer. Talk to your health care provider about this at age 47, when routine screening begins. Direct exam of the colon should be repeated every 5-10 years through age 10, unless early forms of precancerous polyps or small growths are found. Hepatitis C blood testing is recommended for all people born from [birth year] through 1963 and any individual with known risks for hepatitis C. Screening for abdominal aortic aneurysm (AAA)  by ultrasound is recommended for people who have history of high blood pressure or who are current or former smokers. Healthy men should  receive prostate-specific antigen (PSA) blood tests as part of routine cancer screening.  Talk with your health care provider about prostate cancer screening. Testicular cancer screening is  recommended for adult males. Screening includes self-exam, a health care provider exam, and other screening tests. Consult with your health care provider about any symptoms you have or any concerns you have about testicular cancer. Use sunscreen. Apply sunscreen liberally and repeatedly throughout the day. You should seek shade when your shadow is shorter than  you. Protect yourself by wearing long sleeves, pants, a wide-brimmed hat, and sunglasses year round, whenever you are outdoors. Once a month, do a whole-body skin exam, using a mirror to look at the skin on your back. Tell your health care provider about new moles, moles that have irregular borders, moles that are larger than a pencil eraser, or moles that have changed in shape or color. Stay current with required vaccines (immunizations). Influenza vaccine. All adults should be immunized every year. Tetanus, diphtheria, and acellular pertussis (Td, Tdap) vaccine. An adult who has not previously received Tdap or who does not know his vaccine status should receive 1 dose of Tdap. This initial dose should be followed by tetanus and diphtheria toxoids (Td) booster doses every 10 years. Adults with an unknown or incomplete history of completing a 3-dose immunization series with Td-containing vaccines should begin or complete a primary immunization series including a Tdap dose. Adults should receive a Td booster every 10 years. Zoster vaccine. One dose is recommended for adults aged 60 years or older unless certain conditions are present.  PREVNAR - Pneumococcal 13-valent conjugate (PCV13) vaccine. When indicated, a person who is uncertain of his immunization history and has no record of immunization should receive the PCV13 vaccine. An adult aged 19 years or older who has certain medical conditions and has not been previously immunized should receive 1  dose of PCV13 vaccine. This PCV13 should be followed with a dose of pneumococcal polysaccharide (PPSV23) vaccine. The PPSV23 vaccine dose should be obtained 1 or more year(s)after the dose of PCV13 vaccine. An adult aged 19 years or older who has certain medical conditions and previously received 1 or more doses of PPSV23 vaccine should receive 1 dose of PCV13. The PCV13 vaccine dose should be obtained 1 or more years after the last PPSV23 vaccine dose.  PNEUMOVAX - Pneumococcal polysaccharide (PPSV23) vaccine. When PCV13 is also indicated, PCV13 should be obtained first. All adults aged 65 years and older should be immunized. An adult younger than age 65 years who has certain medical conditions should be immunized. Any person who resides in a nursing home or long-term care facility should be immunized. An adult smoker should be immunized. People with an immunocompromised condition and certain other conditions should receive both PCV13 and PPSV23 vaccines. People with human immunodeficiency virus (HIV) infection should be immunized as soon as possible after diagnosis. Immunization during chemotherapy or radiation therapy should be avoided. Routine use of PPSV23 vaccine is not recommended for American Indians, [place] Natives, or people younger than 65 years unless there are medical conditions that require PPSV23 vaccine. When indicated, people who have unknown immunization and have no record of immunization should receive PPSV23 vaccine. One-time revaccination 5 years after the first dose of PPSV23 is recommended for people aged 19-64 years who have chronic kidney failure, nephrotic syndrome, asplenia, or immunocompromised conditions. People who received 1-2 doses of PPSV23 before age 65 years should receive another dose of PPSV23 vaccine at age 65 years or later if at least 5 years have passed since the previous dose. Doses of PPSV23 are not needed for people immunized with PPSV23 at or after age 65  years.  Hepatitis A vaccine. Adults who wish to be protected from this disease, have certain high-risk conditions, work with hepatitis A-infected animals, work in hepatitis A research labs, or travel to or work in countries with a high rate of hepatitis A should be immunized. Adults who were previously unvaccinated and who anticipate close contact   with an international adoptee during the first 60 days after arrival in the [place] from a country with a high rate of hepatitis A should be immunized.  Hepatitis B vaccine. Adults should be immunized if they wish to be protected from this disease, have certain high-risk conditions, may be exposed to blood or other infectious body fluids, are household contacts or sex partners of hepatitis B positive people, are clients or workers in certain care facilities, or travel to or work in countries with a high rate of hepatitis B.  Preventive Service / Frequency  Ages 62 and over Blood pressure check. Lipid and cholesterol check. Lung cancer screening. / Every year if you are aged 2-80 years and have a 30-pack-year history of smoking and currently smoke or have quit within the past 15 years. Yearly screening is stopped once you have quit smoking for at least 15 years or develop a health problem that would prevent you from having lung cancer treatment. Fecal occult blood test (FOBT) of stool. You may not have to do this test if you get a colonoscopy every 10 years. Flexible sigmoidoscopy** or colonoscopy.** / Every 5 years for a flexible sigmoidoscopy or every 10 years for a colonoscopy beginning at age 44 and continuing until age 49. Hepatitis C blood test.** / For all people born from [birth year] through 1963 and any individual with known risks for hepatitis C. Abdominal aortic aneurysm (AAA) screening./ Screening current or former smokers or have Hypertension. Skin self-exam. / Monthly. Influenza vaccine. / Every year. Tetanus, diphtheria, and acellular  pertussis (Tdap/Td) vaccine.** / 1 dose of Td every 10 years.  Zoster vaccine.** / 1 dose for adults aged 44 years or older.         Pneumococcal 13-valent conjugate (PCV13) vaccine.   Pneumococcal polysaccharide (PPSV23) vaccine.   Hepatitis A vaccine.** / Consult your health care provider. Hepatitis B vaccine.** / Consult your health care provider. Screening for abdominal aortic aneurysm (AAA)  by ultrasound is recommended for people who have history of high blood pressure or who are current or former smokers. ++++++++++ Recommend Adult Low Dose Aspirin or  coated  Aspirin 81 mg daily  To reduce risk of Colon Cancer 40 %,  Skin Cancer 26 % ,  Malignant Melanoma 46%  and  Pancreatic cancer 60% ++++++++++++++++++++++ Vitamin D goal  is between 70-100.  Please make sure that you are taking your Vitamin D as directed.  It is very important as a natural anti-inflammatory  helping hair, skin, and nails, as well as reducing stroke and heart attack risk.  It helps your bones and helps with mood. It also decreases numerous cancer risks so please take it as directed.  Low Vit D is associated with a 200-300% higher risk for CANCER  and 200-300% higher risk for HEART   ATTACK  &  STROKE.   .....................................Marland Kitchen It is also associated with higher death rate at younger ages,  autoimmune diseases like Rheumatoid arthritis, Lupus, Multiple Sclerosis.    Also many other serious conditions, like depression, Alzheimer's Dementia, infertility, muscle aches, fatigue, fibromyalgia - just to name a few. ++++++++++++++++++++++ Recommend the book "The END of DIETING" by Dr Excell Seltzer  & the book "The END of DIABETES " by Dr Excell Seltzer At [email or web address] - get book & Audio CD's    Being diabetic has a  300% increased risk for heart attack, stroke, cancer, and alzheimer- type vascular dementia. It is very important that you work harder with diet by  avoiding all foods that are white. Avoid  white rice (brown & wild rice is OK), white potatoes (sweetpotatoes in moderation is OK), White bread or wheat bread or anything made out of white flour like bagels, donuts, rolls, buns, biscuits, cakes, pastries, cookies, pizza crust, and pasta (made from white flour & egg whites) - vegetarian pasta or spinach or wheat pasta is OK. Multigrain breads like Arnold's or [employer], or multigrain sandwich thins or flatbreads.  Diet, exercise and weight loss can reverse and cure diabetes in the early stages.  Diet, exercise and weight loss is very important in the control and prevention of complications of diabetes which affects every system in your body, ie. Brain - dementia/stroke, eyes - glaucoma/blindness, heart - heart attack/heart failure, kidneys - dialysis, stomach - gastric paralysis, intestines - malabsorption, nerves - severe painful neuritis, circulation - gangrene & loss of a leg(s), and finally cancer and Alzheimers.    I recommend avoid fried & greasy foods,  sweets/candy, white rice (brown or wild rice or Quinoa is OK), white potatoes (sweet potatoes are OK) - anything made from white flour - bagels, doughnuts, rolls, buns, biscuits,white and wheat breads, pizza crust and traditional pasta made of white flour & egg white(vegetarian pasta or spinach or wheat pasta is OK).  Multi-grain bread is OK - like multi-grain flat bread or sandwich thins. Avoid alcohol in excess. Exercise is also important.    Eat all the vegetables you want - avoid meat, especially red meat and dairy - especially cheese.  Cheese is the most concentrated form of trans-fats which is the worst thing to clog up our arteries. Veggie cheese is OK which can be found in the fresh produce section at [employer] or [employer] or [employer]  ++++++++++++++++++++++ DASH Eating Plan  DASH stands for "Dietary Approaches to Stop Hypertension."   The DASH eating plan is a healthy eating plan that has been shown to reduce high  blood pressure (hypertension). Additional health benefits may include reducing the risk of type 2 diabetes mellitus, heart disease, and stroke. The DASH eating plan may also help with weight loss. WHAT DO I NEED TO KNOW ABOUT THE DASH EATING PLAN? For the DASH eating plan, you will follow these general guidelines: Choose foods with a percent daily value for sodium of less than 5% (as listed on the food label). Use salt-free seasonings or herbs instead of table salt or sea salt. Check with your health care provider or pharmacist before using salt substitutes. Eat lower-sodium products, often labeled as "lower sodium" or "no salt added." Eat fresh foods. Eat more vegetables, fruits, and low-fat dairy products. Choose whole grains. Look for the word "whole" as the first word in the ingredient list. Choose fish  Limit sweets, desserts, sugars, and sugary drinks. Choose heart-healthy fats. Eat veggie cheese  Eat more home-cooked food and less restaurant, buffet, and fast food. Limit fried foods. Cook foods using methods other than frying. Limit canned vegetables. If you do use them, rinse them well to decrease the sodium. When eating at a restaurant, ask that your food be prepared with less salt, or no salt if possible.                      WHAT FOODS CAN I EAT? Read Dr Fara Olden Fuhrman's books on The End of Dieting & The End of Diabetes  Grains Whole grain or whole wheat bread. Brown rice. Whole grain or whole wheat pasta. Quinoa, bulgur, and  whole grain cereals. Low-sodium cereals. Corn or whole wheat flour tortillas. Whole grain cornbread. Whole grain crackers. Low-sodium crackers.  Vegetables Fresh or frozen vegetables (raw, steamed, roasted, or grilled). Low-sodium or reduced-sodium tomato and vegetable juices. Low-sodium or reduced-sodium tomato sauce and paste. Low-sodium or reduced-sodium canned vegetables.   Fruits All fresh, canned (in natural juice), or frozen fruits.  Protein  Products  All fish and seafood.  Dried beans, peas, or lentils. Unsalted nuts and seeds. Unsalted canned beans.  Dairy Low-fat dairy products, such as skim or 1% milk, 2% or reduced-fat cheeses, low-fat ricotta or cottage cheese, or plain low-fat yogurt. Low-sodium or reduced-sodium cheeses.  Fats and Oils Tub margarines without trans fats. Light or reduced-fat mayonnaise and salad dressings (reduced sodium). Avocado. Safflower, olive, or canola oils. Natural peanut or almond butter.  Other Unsalted popcorn and pretzels. The items listed above may not be a complete list of recommended foods or beverages. Contact your dietitian for more options.  ++++++++++++++++++++  WHAT FOODS ARE NOT RECOMMENDED? Grains/ White flour or wheat flour White bread. White pasta. White rice. Refined cornbread. Bagels and croissants. Crackers that contain trans fat.  Vegetables  Creamed or fried vegetables. Vegetables in a . Regular canned vegetables. Regular canned tomato sauce and paste. Regular tomato and vegetable juices.  Fruits Dried fruits. Canned fruit in light or heavy syrup. Fruit juice.  Meat and Other Protein Products Meat in general - RED meat & White meat.  Fatty cuts of meat. Ribs, chicken wings, all processed meats as bacon, sausage, bologna, salami, fatback, hot dogs, bratwurst and packaged luncheon meats.  Dairy Whole or 2% milk, cream, half-and-half, and cream cheese. Whole-fat or sweetened yogurt. Full-fat cheeses or blue cheese. Non-dairy creamers and whipped toppings. Processed cheese, cheese spreads, or cheese curds.  Condiments Onion and garlic salt, seasoned salt, table salt, and sea salt. Canned and packaged gravies. Worcestershire sauce. Tartar sauce. Barbecue sauce. Teriyaki sauce. Soy sauce, including reduced sodium. Steak sauce. Fish sauce. Oyster sauce. Cocktail sauce. Horseradish. Ketchup and mustard. Meat flavorings and tenderizers. Bouillon cubes. Hot sauce. Tabasco sauce.  Marinades. Taco seasonings. Relishes.  Fats and Oils Butter, stick margarine, lard, shortening and bacon fat. Coconut, palm kernel, or palm oils. Regular salad dressings.  Pickles and olives. Salted popcorn and pretzels.  The items listed above may not be a complete list of foods and beverages to avoid.

## 2022-11-07 NOTE — Progress Notes (Signed)
Keith [name]  Annual  Screening/Preventative Visit  & Comprehensive Evaluation & Examination  Future Appointments  Date Time Provider Department  11/02/2021       CPE  2:00 PM Unk Pinto, MD [facility]  01/23/2022     Wellness 11:30 AM [name], NP [facility]  11/07/2022  2:00 PM Unk Pinto, MD [facility]            This very nice 89 y.o. MWM presents for a Screening /Preventative Visit & comprehensive evaluation and management of multiple medical co-morbidities.  Patient has been followed for HTN, HLD, Prediabetes and Vitamin D Deficiency. Patient's GERD is controlled on his meds.  In 2015, patient underwent robotic resection of a Rectosigmoid Ca followed with Chemotx and followed with annual CTs cans by Dr Benay Spice. Abd CT scan in 2021 showed AAA and patient also has a Thoracic Ao Aneurysm followed by [facility] .        Labile HTN predates circa 1980's.  In 1981 he underwent PCAfor ACS. Patient's BP has been controlled at home.  Today's BP  is at goal - 128/68. Patient denies any cardiac symptoms as chest pain, palpitations, shortness of breath, dizziness or ankle swelling.        Patient's hyperlipidemia is controlled with diet and Atorvastatin.  Patient denies myalgias or other medication SE's. Last lipids were at goal :  Lab Results  Component Value Date   CHOL 132 05/08/2021   HDL 46 05/08/2021   LDL 67 05/08/2021   TRIG 111 05/08/2021   CHOLHDL 2.9 05/08/2021         Patient has hx/o prediabetes (A1c 5.9% /2014) and patient denies reactive hypoglycemic symptoms, visual blurring, diabetic polys or paresthesias. Last A1c was normal & at goal :   Lab Results  Component Value Date   HGBA1C 5.6 05/08/2021          Finally, patient has history of Vitamin D Deficiency ("27" /2009) and last vitamin D was at goal :   Lab Results  Component Value Date   VD25OH 74 05/08/2021   Current Outpatient Medications  Medication Instructions   aspirin 81 mg, Oral, Every  morning   atorvastatin (LIPITOR) 20 MG tablet Take  1 tablet  Daily     Cholecalciferol (VITAMIN D PO) 5,000 Int'l Units, Oral, Every morning   ipratropium (ATROVENT) 0.06 % nasal spray USE 1 TO 2 SPRAY(S) IN EACH NOSTRIL TWICE DAILY TO THREE TIMES DAILY AS NEEDED   Magnesium 250 MG TABS 1 tablet, Oral, Daily   Omega-3 Fatty Acids (FISH OIL PO) 1 tablet, Oral, Every morning   triamcinolone ointment (KENALOG) 0.1 % 1 Application, Topical, 2 times daily     Allergies  Allergen Reactions   Penicillins     Past Medical History:  Diagnosis Date   Cancer ([place])    cancer of recto-sigmoid junction-dx 07/2015   Elevated PSA    GERD (gastroesophageal reflux disease)    Hemorrhoids    Hyperlipidemia    Hypertension    Pneumonia    age 48   Vitamin D deficiency     Health Maintenance  Topic Date Due   Zoster Vaccines- Shingrix (1 of 2) Never done   INFLUENZA VACCINE  05/08/2021   COVID-19 Vaccine (5 - Booster for Pfizer series) 08/09/2021   TETANUS/TDAP  07/01/2030   Pneumonia Vaccine 17+ Years old  Completed   HPV VACCINES  Aged Out    Immunization History  Administered Date(s) Administered   Fluad Quad(high Dose )  06/09/2019   Influenza Split 07/29/2013, 06/22/2015   Influenza, High Dose  06/15/2014, 06/12/2016   Influenza 06/04/2017, 06/10/2018, 06/17/2020   Moderna Sars-Covid-2 Vacc 06/14/2021   PFIZER SARS-COV-2 Vacc 10/27/2019, 11/18/2019, 07/08/2020   Pneumococcal C-13 06/15/2014   Pneumococcal -23 03/12/2016   Td 02/09/2010, 07/01/2020   Zoster, Live 10/08/2000   Last Colon    -  06/29/2015  - Flex Sig (+) Rectal Ca after (+) Cologard                        -  08/02/2015  - Robotic Partial colectomy - Dr Barry Dienes                        -  04/19/2016  - Dr Paulita Fujita - Colon s/p partial colectomy                         -  10/23/2019  - Virtual Colonoscopy  - Dr Paulita Fujita   Past Surgical History:  Procedure Laterality Date   CATARACT EXTRACTION, BILATERAL Bilateral 2021    Dr. Katy Fitch   EUS N/A 06/29/2015   LOWER ENDOSCOPIC ULTRASOUND (EUS);  Arta Silence, MD   HERNIA REPAIR     left inguinal   XI ROBOTIC ASSISTED LOWER ANT  RESECTION N/A 08/02/2015   Procedure: XI ROBOTIC ASSISTED LOWER ANTERIOR RESECTION;  Stark Klein, MD     Family History  Problem Relation Age of Onset   Heart disease Mother    Heart disease Father      Social History   Tobacco Use   Smoking status: Former    Types: Cigarettes    Quit date: 10/04/1965    Years since quitting: 56.1   Smokeless tobacco: Never  Vaping Use   Vaping Use: Never used  Substance Use Topics   Alcohol use: Yes    Alcohol/week: 0.0 standard drinks    Comment: rarely   Drug use: No      ROS Constitutional: Denies fever, chills, weight loss/gain, headaches, insomnia,  night sweats or change in appetite. Does c/o fatigue. Eyes: Denies redness, blurred vision, diplopia, discharge, itchy or watery eyes.  ENT: Denies discharge, congestion, post nasal drip, epistaxis, sore throat, earache, hearing loss, dental pain, Tinnitus, Vertigo, Sinus pain or snoring.  Cardio: Denies chest pain, palpitations, irregular heartbeat, syncope, dyspnea, diaphoresis, orthopnea, PND, claudication or edema Respiratory: denies cough, dyspnea, DOE, pleurisy, hoarseness, laryngitis or wheezing.  Gastrointestinal: Denies dysphagia, heartburn, reflux, water brash, pain, cramps, nausea, vomiting, bloating, diarrhea, constipation, hematemesis, melena, hematochezia, jaundice or hemorrhoids Genitourinary: Denies dysuria, frequency, urgency, nocturia, hesitancy, discharge, hematuria or flank pain Musculoskeletal: Denies arthralgia, myalgia, stiffness, Jt. Swelling, pain, limp or strain/sprain. Denies Falls. Skin: Denies puritis, rash, hives, warts, acne, eczema or change in skin lesion Neuro: No weakness, tremor, incoordination, spasms, paresthesia or pain Psychiatric: Denies confusion, memory loss or sensory loss. Denies  Depression. Endocrine: Denies change in weight, skin, hair change, nocturia, and paresthesia, diabetic polys, visual blurring or hyper / hypo glycemic episodes.  Heme/Lymph: No excessive bleeding, bruising or enlarged lymph nodes.   Physical Exam  BP 138/64   Pulse (!) 58   Temp 97.7 F (36.5 C)   Resp 16   Ht 5\' 10"  (1.778 m)   Wt 161 lb 9.6 oz (73.3 kg)   SpO2 94%   BMI 23.19 kg/m   General Appearance: Well nourished and well groomed and in no apparent  distress.  Eyes: PERRLA, EOMs, conjunctiva no swelling or erythema, normal fundi and vessels. Sinuses: No frontal/maxillary tenderness ENT/Mouth: EACs patent / TMs  nl. Nares clear without erythema, swelling, mucoid exudates. Oral hygiene is good. No erythema, swelling, or exudate. Tongue normal, non-obstructing. Tonsils not swollen or erythematous. Hearing normal.  Neck: Supple, thyroid not palpable. No bruits, nodes or JVD. Respiratory: Respiratory effort normal.  BS equal and clear bilateral without rales, rhonci, wheezing or stridor. Cardio: Heart sounds are normal with regular rate and rhythm and no murmurs, rubs or gallops. Peripheral pulses are normal and equal bilaterally without edema. No aortic or femoral bruits. Chest: symmetric with normal excursions and percussion.  Abdomen: Soft, with Nl bowel sounds. Nontender, no guarding, rebound, hernias, masses, or organomegaly.  Lymphatics: Non tender without lymphadenopathy.  Musculoskeletal: Full ROM all peripheral extremities, joint stability, 5/5 strength, and normal gait. Skin: Warm and dry without rashes, lesions, cyanosis, clubbing or  ecchymosis.  Neuro: Cranial nerves intact, reflexes equal bilaterally. Normal muscle tone, no cerebellar symptoms. Sensation intact.  Pysch: Alert and oriented X 3 with normal affect, insight and judgment appropriate.   Assessment and Plan  1. Annual Preventative/Screening Exam    2. Labile hypertension  - EKG 12-Lead - [place],  RETROPERITNL ABD,  LTD - Urinalysis, Routine w reflex microscopic - Microalbumin / creatinine urine ratio - CBC with Differential/Platelet - COMPLETE METABOLIC PANEL WITH GFR - Magnesium - TSH  3. Hyperlipidemia, mixed  - EKG 12-Lead - [place], RETROPERITNL ABD,  LTD - Lipid panel - TSH  4. Abnormal glucose  - EKG 12-Lead - [place], RETROPERITNL ABD,  LTD - Hemoglobin A1c - Insulin, random  5. Vitamin D deficiency  - VITAMIN D 25 Hydroxy   6. ASHD (arteriosclerotic heart disease)  - EKG 12-Lead - Lipid panel  7. Aortic atherosclerosis ([place]) by Abd CTscan 2019  - EKG 12-Lead - [place], RETROPERITNL ABD,  LTD - Lipid panel  8. Aneurysm of descending thoracic aorta without rupture  - EKG 12-Lead - [place], RETROPERITNL ABD,  LTD - Lipid panel  9. Screening for colorectal cancer  - POC Hemoccult Bld/Stl 10. BPH with obstruction/lower urinary tract symptoms  - PSA  11. Prostate cancer screening  - PSA  12. Screening for ischemic heart disease  - EKG 12-Lead  13. FHx: heart disease  - EKG 12-Lead - [place], RETROPERITNL ABD,  LTD  14. Former smoker  - [place], RETROPERITNL ABD,  [place]  15. Medication management  - CBC with Differential/Platelet - COMPLETE METABOLIC PANEL WITH GFR - Magnesium - Lipid panel - TSH - Hemoglobin A1c - Insulin, random - VITAMIN D 25 Hydroxy           Patient was counseled in prudent diet, weight control to achieve/maintain BMI less than 25, BP monitoring, regular exercise and medications as discussed.  Discussed med effects and SE's. Routine screening labs and tests as requested with regular follow-up as recommended. Over 40 minutes of exam, counseling, chart review and high complex critical decision making was performed   Kirtland Bouchard, MD

## 2022-11-08 LAB — URINALYSIS, ROUTINE W REFLEX MICROSCOPIC
Bilirubin Urine: NEGATIVE
Glucose, UA: NEGATIVE
Hgb urine dipstick: NEGATIVE
Ketones, ur: NEGATIVE
Leukocytes,Ua: NEGATIVE
Nitrite: NEGATIVE
Protein, ur: NEGATIVE
Specific Gravity, Urine: 1.01 (ref 1.001–1.035)
pH: 6 (ref 5.0–8.0)

## 2022-11-08 LAB — COMPLETE METABOLIC PANEL WITH GFR
AG Ratio: 1.5 (calc) (ref 1.0–2.5)
ALT: 14 U/L (ref 9–46)
AST: 19 U/L (ref 10–35)
Albumin: 4 g/dL (ref 3.6–5.1)
Alkaline phosphatase (APISO): 58 U/L (ref 35–144)
BUN/Creatinine Ratio: 22 (calc) (ref 6–22)
BUN: 15 mg/dL (ref 7–25)
CO2: 32 mmol/L (ref 20–32)
Calcium: 9.4 mg/dL (ref 8.6–10.3)
Chloride: 102 mmol/L (ref 98–110)
Creat: 0.69 mg/dL — ABNORMAL LOW (ref 0.70–1.22)
Globulin: 2.6 g/dL (calc) (ref 1.9–3.7)
Glucose, Bld: 73 mg/dL (ref 65–99)
Potassium: 4.3 mmol/L (ref 3.5–5.3)
Sodium: 140 mmol/L (ref 135–146)
Total Bilirubin: 1.3 mg/dL — ABNORMAL HIGH (ref 0.2–1.2)
Total Protein: 6.6 g/dL (ref 6.1–8.1)
eGFR: 89 mL/min/{1.73_m2} (ref >=60)

## 2022-11-08 LAB — HEMOGLOBIN A1C
Hgb A1c MFr Bld: 5.8 % of total Hgb — ABNORMAL HIGH (ref <5.7)
Mean Plasma Glucose: 120 mg/dL
eAG (mmol/L): 6.6 mmol/L

## 2022-11-08 LAB — LIPID PANEL
Cholesterol: 137 mg/dL (ref <200)
HDL: 45 mg/dL (ref >=40)
LDL Cholesterol (Calc): 67 mg/dL (calc)
Non-HDL Cholesterol (Calc): 92 mg/dL (calc) (ref <130)
Total CHOL/HDL Ratio: 3 (calc) (ref <5.0)
Triglycerides: 176 mg/dL — ABNORMAL HIGH (ref <150)

## 2022-11-08 LAB — MAGNESIUM: Magnesium: 2 mg/dL (ref 1.5–2.5)

## 2022-11-08 LAB — CBC WITH DIFFERENTIAL/PLATELET
Absolute Monocytes: 593 cells/uL (ref 200–950)
Basophils Absolute: 51 cells/uL (ref 0–200)
Basophils Relative: 0.9 %
Eosinophils Absolute: 268 cells/uL (ref 15–500)
Eosinophils Relative: 4.7 %
HCT: 44.2 % (ref 38.5–50.0)
Hemoglobin: 15 g/dL (ref 13.2–17.1)
Lymphs Abs: 1511 cells/uL (ref 850–3900)
MCH: 30.7 pg (ref 27.0–33.0)
MCHC: 33.9 g/dL (ref 32.0–36.0)
MCV: 90.4 fL (ref 80.0–100.0)
MPV: 11.1 fL (ref 7.5–12.5)
Monocytes Relative: 10.4 %
Neutro Abs: 3278 cells/uL (ref 1500–7800)
Neutrophils Relative %: 57.5 %
Platelets: 168 10*3/uL (ref 140–400)
RBC: 4.89 10*6/uL (ref 4.20–5.80)
RDW: 12.7 % (ref 11.0–15.0)
Total Lymphocyte: 26.5 %
WBC: 5.7 10*3/uL (ref 3.8–10.8)

## 2022-11-08 LAB — MICROALBUMIN / CREATININE URINE RATIO
Creatinine, Urine: 51 mg/dL (ref 20–320)
Microalb Creat Ratio: 8 mcg/mg creat (ref <30)
Microalb, Ur: 0.4 mg/dL

## 2022-11-08 LAB — TSH: TSH: 2.13 mIU/L (ref 0.40–4.50)

## 2022-11-08 LAB — PSA: PSA: 2.75 ng/mL (ref <=4.00)

## 2022-11-08 LAB — VITAMIN D 25 HYDROXY (VIT D DEFICIENCY, FRACTURES): Vit D, 25-Hydroxy: 98 ng/mL (ref 30–100)

## 2022-11-08 LAB — INSULIN, RANDOM: Insulin: 27 u[IU]/mL — ABNORMAL HIGH

## 2022-11-08 NOTE — Progress Notes (Signed)
<><><><><><><><><><><><><><><><><><><><><><><><><><><><><><><><><> <><><><><><><><><><><><><><><><><><><><><><><><><><><><><><><><><> -   Test results slightly outside the reference range are not unusual. If there is anything important, I will review this with you,  otherwise it is considered normal test values.  If you have further questions,  please do not hesitate to contact me at the office or via My Chart.  <><><><><><><><><><><><><><><><><><><><><><><><><><><><><><><><><> <><><><><><><><><><><><><><><><><><><><><><><><><><><><><><><><><>  -  A1c = 5.8% - still borderline elevated blood sugars    - So    - Avoid Sweets, Candy & White Stuff   - White Rice, White Potatoes, White Flour  - Breads &  Pasta <><><><><><><><><><><><><><><><><><><><><><><><><><><><><><><><><>  -  Chol = 137  - >  Excellent   - Very low risk for Heart Attack  / Stroke <><><><><><><><><><><><><><><><><><><><><><><><><><><><><><><><><>  -  Vit D = 98 - Excellent - Please keep dose same  <><><><><><><><><><><><><><><><><><><><><><><><><><><><><><><><><>  -  PSA - very low - No Prostate  cancer   - Great !  <><><><><><><><><><><><><><><><><><><><><><><><><><><><><><><><><>  -  All Else - CBC - Kidneys - Electrolytes - Liver - Magnesium & Thyroid    - all  Normal / OK <><><><><><><><><><><><><><><><><><><><><><><><><><><><><><><><><> <><><><><><><><><><><><><><><><><><><><><><><><><><><><><><><><><>

## 2022-12-24 ENCOUNTER — Other Ambulatory Visit: Payer: Self-pay

## 2022-12-24 DIAGNOSIS — Z1211 Encounter for screening for malignant neoplasm of colon: Secondary | ICD-10-CM | POA: Diagnosis not present

## 2022-12-24 DIAGNOSIS — Z1212 Encounter for screening for malignant neoplasm of rectum: Secondary | ICD-10-CM | POA: Diagnosis not present

## 2022-12-24 LAB — POC HEMOCCULT BLD/STL (HOME/3-CARD/SCREEN)
Card #2 Fecal Occult Blod, POC: NEGATIVE
Card #3 Fecal Occult Blood, POC: NEGATIVE
Fecal Occult Blood, POC: NEGATIVE

## 2023-01-16 ENCOUNTER — Other Ambulatory Visit: Payer: Self-pay | Admitting: Surgery

## 2023-01-16 DIAGNOSIS — I712 Thoracic aortic aneurysm, without rupture, unspecified: Secondary | ICD-10-CM

## 2023-01-20 ENCOUNTER — Other Ambulatory Visit: Payer: Self-pay

## 2023-01-20 ENCOUNTER — Emergency Department (HOSPITAL_COMMUNITY): Payer: Medicare HMO

## 2023-01-20 ENCOUNTER — Emergency Department (HOSPITAL_COMMUNITY)
Admission: EM | Admit: 2023-01-20 | Discharge: 2023-01-21 | Disposition: A | Discharge status: Home / Self Care | Payer: Medicare HMO | Attending: Emergency Medicine | Admitting: Emergency Medicine

## 2023-01-20 ENCOUNTER — Encounter (HOSPITAL_COMMUNITY): Payer: Self-pay | Admitting: *Deleted

## 2023-01-20 DIAGNOSIS — Z7982 Long term (current) use of aspirin: Secondary | ICD-10-CM | POA: Insufficient documentation

## 2023-01-20 DIAGNOSIS — R079 Chest pain, unspecified: Secondary | ICD-10-CM | POA: Diagnosis not present

## 2023-01-20 DIAGNOSIS — R072 Precordial pain: Secondary | ICD-10-CM | POA: Diagnosis not present

## 2023-01-20 DIAGNOSIS — R0789 Other chest pain: Secondary | ICD-10-CM | POA: Diagnosis not present

## 2023-01-20 DIAGNOSIS — I700 Atherosclerosis of aorta: Secondary | ICD-10-CM | POA: Diagnosis not present

## 2023-01-20 LAB — BASIC METABOLIC PANEL
Anion gap: 10 (ref 5–15)
BUN: 16 mg/dL (ref 8–23)
CO2: 27 mmol/L (ref 22–32)
Calcium: 9.3 mg/dL (ref 8.9–10.3)
Chloride: 103 mmol/L (ref 98–111)
Creatinine, Ser: 0.84 mg/dL (ref 0.61–1.24)
GFR, Estimated: >60 mL/min (ref >60)
Glucose, Bld: 111 mg/dL — ABNORMAL HIGH (ref 70–99)
Potassium: 4.1 mmol/L (ref 3.5–5.1)
Sodium: 140 mmol/L (ref 135–145)

## 2023-01-20 LAB — HEPATIC FUNCTION PANEL
ALT: 17 U/L (ref 0–44)
AST: 22 U/L (ref 15–41)
Albumin: 3.4 g/dL — ABNORMAL LOW (ref 3.5–5.0)
Alkaline Phosphatase: 54 U/L (ref 38–126)
Bilirubin, Direct: 0.2 mg/dL (ref 0.0–0.2)
Indirect Bilirubin: 1.3 mg/dL — ABNORMAL HIGH (ref 0.3–0.9)
Total Bilirubin: 1.5 mg/dL — ABNORMAL HIGH (ref 0.3–1.2)
Total Protein: 6.4 g/dL — ABNORMAL LOW (ref 6.5–8.1)

## 2023-01-20 LAB — TROPONIN I (HIGH SENSITIVITY)
Troponin I (High Sensitivity): 8 ng/L (ref <18)
Troponin I (High Sensitivity): 13 ng/L (ref <18)

## 2023-01-20 LAB — CBC
HCT: 44.2 % (ref 39.0–52.0)
Hemoglobin: 14.7 g/dL (ref 13.0–17.0)
MCH: 30.4 pg (ref 26.0–34.0)
MCHC: 33.3 g/dL (ref 30.0–36.0)
MCV: 91.3 fL (ref 80.0–100.0)
Platelets: 169 10*3/uL (ref 150–400)
RBC: 4.84 MIL/uL (ref 4.22–5.81)
RDW: 12.9 % (ref 11.5–15.5)
WBC: 9.9 10*3/uL (ref 4.0–10.5)
nRBC: 0 % (ref 0.0–0.2)

## 2023-01-20 LAB — LIPASE, BLOOD: Lipase: 42 U/L (ref 11–51)

## 2023-01-20 MED ORDER — ALUM & MAG HYDROXIDE-SIMETH 200-200-20 MG/5ML PO SUSP
30.0000 mL | Freq: Once | ORAL | Status: AC
  Administered 2023-01-20: 30 mL via ORAL
  Filled 2023-01-20: qty 30

## 2023-01-20 MED ORDER — FAMOTIDINE IN NACL 20-0.9 MG/50ML-% IV SOLN
20.0000 mg | Freq: Once | INTRAVENOUS | Status: AC
  Administered 2023-01-20: 20 mg via INTRAVENOUS
  Filled 2023-01-20: qty 50

## 2023-01-20 MED ORDER — IOHEXOL 350 MG/ML SOLN
75.0000 mL | Freq: Once | INTRAVENOUS | Status: AC | PRN
  Administered 2023-01-20: 75 mL via INTRAVENOUS

## 2023-01-20 NOTE — ED Triage Notes (Signed)
The pt is c/o some chest pain since around 1300 today after eating lunch  constant since then no other symptoms  no previous history

## 2023-01-20 NOTE — ED Provider Notes (Signed)
[place] EMERGENCY DEPARTMENT AT [facility] Provider Note   CSN: [id number] Arrival date & time: 01/20/23  1626     History {Add pertinent medical, surgical, social history, OB history to HPI:1} Chief Complaint  Patient presents with   Chest Pain    Keith [name] is a 89 y.o. male.  HPI   89 year old male with known thoracic aortic aneurysm and abdominal aortic aneurysm that is currently being monitored as an outpatient presents emergency department with midsternal chest pain.  Patient states that he was sitting down and reading around 1:00 when he developed midsternal chest pain.  He describes it as sharp.  States that the pain right now does not radiate to his back or abdomen, but he does have history of chronic back pain.  Patient states at 1 point he felt nauseous like indigestion.  He took a nap and when he woke up the discomfort was still present so he came in for evaluation.  No associated shortness of breath or cough.  No swelling of his lower extremities.  He is coming up on his 2-year outpatient reevaluation of the aneurysms.  Denies any other acute illness.  Home Medications Prior to Admission medications   Medication Sig Start Date End Date Taking? Authorizing Provider  aspirin 81 MG tablet Take 81 mg by mouth every morning.     [provider]  atorvastatin (LIPITOR) 20 MG tablet Take  1 tablet  Daily  for Cholesterol                                                          /                                 TAKE BY                                MOUTH                                   ONCE DAILY 10/25/22   Lucky Cowboy, MD  Cholecalciferol (VITAMIN D PO) Take 5,000 Int'l Units by mouth every morning.     [provider]  ipratropium (ATROVENT) 0.06 % nasal spray USE 1 TO 2 SPRAY(S) IN EACH NOSTRIL TWICE DAILY TO THREE TIMES DAILY AS NEEDED 11/07/22   Lucky Cowboy, MD  Magnesium 250 MG TABS Take 1 tablet by mouth daily.    [provider]  Omega-3 Fatty Acids (FISH OIL PO) Take 1 tablet by mouth every morning.     [provider]  triamcinolone ointment (KENALOG) 0.1 % Apply 1 Application topically 2 (two) times daily. 05/07/22   Adela Glimpse, NP      Allergies    Penicillins    Review of Systems   Review of Systems  Constitutional:  Negative for chills, diaphoresis and fever.  Respiratory:  Negative for cough, chest tightness and shortness of breath.   Cardiovascular:  Positive for chest pain. Negative for palpitations and leg swelling.  Gastrointestinal:  Negative for abdominal pain, diarrhea and vomiting.  Skin:  Negative for rash.  Neurological:  Negative for headaches.    Physical Exam Updated Vital Signs BP (!) 192/68   Pulse 88   Temp 98.2 F (36.8 C)   Resp 18   Ht 5\' 10"  (1.778 m)   Wt 73.3 kg   SpO2 97%   BMI 23.19 kg/m  Physical Exam Vitals and nursing note reviewed.  Constitutional:      General: He is not in acute distress.    Appearance: Normal appearance.  HENT:     Head: Normocephalic.     Mouth/Throat:     Mouth: Mucous membranes are moist.  Cardiovascular:     Rate and Rhythm: Normal rate.  Pulmonary:     Effort: Pulmonary effort is normal. No respiratory distress.     Breath sounds: No decreased breath sounds, wheezing or rales.  Abdominal:     Palpations: Abdomen is soft.     Tenderness: There is no abdominal tenderness.  Musculoskeletal:     Right lower leg: No edema.     Left lower leg: No edema.  Skin:    General: Skin is warm.  Neurological:     Mental Status: He is alert and oriented to person, place, and time. Mental status is at baseline.  Psychiatric:        Mood and Affect: Mood normal.     ED Results / Procedures / Treatments   Labs (all labs ordered are listed, but only abnormal results are displayed) Labs Reviewed  BASIC METABOLIC PANEL - Abnormal; Notable for the following components:      Result Value   Glucose, Bld 111 (*)     All other components within normal limits  CBC  HEPATIC FUNCTION PANEL  LIPASE, BLOOD  TROPONIN I (HIGH SENSITIVITY)    EKG EKG Interpretation  Date/Time:  Sunday January 20 2023 16:46:45 EDT Ventricular Rate:  67 PR Interval:  190 QRS Duration: 88 QT Interval:  410 QTC Calculation: 433 R Axis:   64 Text Interpretation: Sinus rhythm with Premature atrial complexes Otherwise normal ECG No previous ECGs available Confirmed by Coralee Pesa [id number]) on 01/20/2023 5:26:59 PM  Radiology DG Chest 2 View  Result Date: 01/20/2023 CLINICAL DATA:  Chest pain EXAM: CHEST - 2 VIEW COMPARISON:  Chest x-ray October 05, 2015 FINDINGS: The cardiomediastinal silhouette is unchanged in contour. No focal pulmonary opacity. No pleural effusion or pneumothorax. The visualized upper abdomen is unremarkable. No acute osseous abnormality. IMPRESSION: No active cardiopulmonary disease. Electronically Signed   By: Jacob Moores M.D.   On: 01/20/2023 17:47    Procedures Procedures  {Document cardiac monitor, telemetry assessment procedure when appropriate:1}  Medications Ordered in ED Medications - No data to display  ED Course/ Medical Decision Making/ A&P   {   Click here for ABCD2, HEART and other calculatorsREFRESH Note before signing :1}                          Medical Decision Making Amount and/or Complexity of Data Reviewed Labs: ordered. Radiology: ordered.   ***  {Document critical care time when appropriate:1} {Document review of labs and clinical decision tools ie heart score, Chads2Vasc2 etc:1}  {Document your independent review of radiology images, and any outside records:1} {Document your discussion with family members, caretakers, and with consultants:1} {Document social determinants of health affecting pt's care:1} {Document your decision making why or why not admission, treatments were needed:1} Final Clinical Impression(s) / ED Diagnoses Final diagnoses:  None  Rx /  DC Orders ED Discharge Orders     None

## 2023-01-21 NOTE — Discharge Instructions (Signed)
You have been seen and discharged from the emergency department.  The CT of your chest abdomen and pelvis shows stable aneurysms.  Your cardiac workup showed normal enzymes.  However given the nature of your chest pain it is recommended that you follow-up with a cardiologist for further heart testing.  A referral has been placed, they should contact you.  In the event that they do not I have attached their information to this packet.  Please make sure that a appointment is made this coming week.  Follow-up with your primary provider for further evaluation and further care. Take home medications as prescribed. If you have any worsening symptoms, ongoing chest pain, shortness of breath or further concerns for your health please return to an emergency department for further evaluation.

## 2023-01-24 ENCOUNTER — Telehealth: Payer: Self-pay

## 2023-01-24 NOTE — Telephone Encounter (Signed)
     Patient  visit on 4/15  at [facility]   Have you been able to follow up with your primary care physician? Yes   The patient was or was not able to obtain any needed medicine or equipment. Yes   Are there diet recommendations that you are having difficulty following? Na   Patient expresses understanding of discharge instructions and education provided has no other needs at this time.  Yes     Lenard Forth [facility] Guide, [facility] [phone] [address] [place], [place], [place] [address] Phone: [phone] Email: [email or web address]

## 2023-01-28 DIAGNOSIS — Z87891 Personal history of nicotine dependence: Secondary | ICD-10-CM | POA: Diagnosis not present

## 2023-01-28 DIAGNOSIS — I700 Atherosclerosis of aorta: Secondary | ICD-10-CM | POA: Diagnosis not present

## 2023-01-28 DIAGNOSIS — Z880 Allergy status to penicillin: Secondary | ICD-10-CM | POA: Diagnosis not present

## 2023-01-28 DIAGNOSIS — E785 Hyperlipidemia, unspecified: Secondary | ICD-10-CM | POA: Diagnosis not present

## 2023-01-28 DIAGNOSIS — I2510 Atherosclerotic heart disease of native coronary artery without angina pectoris: Secondary | ICD-10-CM | POA: Diagnosis not present

## 2023-01-28 DIAGNOSIS — Z7982 Long term (current) use of aspirin: Secondary | ICD-10-CM | POA: Diagnosis not present

## 2023-01-28 DIAGNOSIS — J302 Other seasonal allergic rhinitis: Secondary | ICD-10-CM | POA: Diagnosis not present

## 2023-01-28 DIAGNOSIS — Z85038 Personal history of other malignant neoplasm of large intestine: Secondary | ICD-10-CM | POA: Diagnosis not present

## 2023-01-28 DIAGNOSIS — R030 Elevated blood-pressure reading, without diagnosis of hypertension: Secondary | ICD-10-CM | POA: Diagnosis not present

## 2023-01-28 DIAGNOSIS — Z809 Family history of malignant neoplasm, unspecified: Secondary | ICD-10-CM | POA: Diagnosis not present

## 2023-02-11 ENCOUNTER — Encounter: Payer: Self-pay | Admitting: Nurse Practitioner

## 2023-02-11 ENCOUNTER — Ambulatory Visit (INDEPENDENT_AMBULATORY_CARE_PROVIDER_SITE_OTHER): Payer: Medicare HMO | Admitting: Nurse Practitioner

## 2023-02-11 VITALS — BP 150/74 | HR 53 | Temp 97.5°F | Ht 70.0 in | Wt 159.4 lb

## 2023-02-11 DIAGNOSIS — I712 Thoracic aortic aneurysm, without rupture, unspecified: Secondary | ICD-10-CM | POA: Diagnosis not present

## 2023-02-11 DIAGNOSIS — R0989 Other specified symptoms and signs involving the circulatory and respiratory systems: Secondary | ICD-10-CM | POA: Diagnosis not present

## 2023-02-11 DIAGNOSIS — I2510 Atherosclerotic heart disease of native coronary artery without angina pectoris: Secondary | ICD-10-CM

## 2023-02-11 DIAGNOSIS — R7303 Prediabetes: Secondary | ICD-10-CM | POA: Diagnosis not present

## 2023-02-11 DIAGNOSIS — Z79899 Other long term (current) drug therapy: Secondary | ICD-10-CM | POA: Diagnosis not present

## 2023-02-11 DIAGNOSIS — Z0000 Encounter for general adult medical examination without abnormal findings: Secondary | ICD-10-CM

## 2023-02-11 DIAGNOSIS — I700 Atherosclerosis of aorta: Secondary | ICD-10-CM

## 2023-02-11 DIAGNOSIS — J3089 Other allergic rhinitis: Secondary | ICD-10-CM | POA: Diagnosis not present

## 2023-02-11 DIAGNOSIS — K219 Gastro-esophageal reflux disease without esophagitis: Secondary | ICD-10-CM

## 2023-02-11 DIAGNOSIS — R6889 Other general symptoms and signs: Secondary | ICD-10-CM | POA: Diagnosis not present

## 2023-02-11 DIAGNOSIS — Z0001 Encounter for general adult medical examination with abnormal findings: Secondary | ICD-10-CM | POA: Diagnosis not present

## 2023-02-11 DIAGNOSIS — E782 Mixed hyperlipidemia: Secondary | ICD-10-CM | POA: Diagnosis not present

## 2023-02-11 DIAGNOSIS — E559 Vitamin D deficiency, unspecified: Secondary | ICD-10-CM | POA: Diagnosis not present

## 2023-02-11 DIAGNOSIS — K5904 Chronic idiopathic constipation: Secondary | ICD-10-CM | POA: Diagnosis not present

## 2023-02-11 DIAGNOSIS — C187 Malignant neoplasm of sigmoid colon: Secondary | ICD-10-CM

## 2023-02-11 DIAGNOSIS — R972 Elevated prostate specific antigen [PSA]: Secondary | ICD-10-CM

## 2023-02-11 LAB — CBC WITH DIFFERENTIAL/PLATELET
Basophils Absolute: 48 cells/uL (ref 0–200)
Basophils Relative: 0.8 %
Lymphs Abs: 1602 cells/uL (ref 850–3900)
MCV: 90.1 fL (ref 80.0–100.0)
MPV: 11.4 fL (ref 7.5–12.5)
Platelets: 171 10*3/uL (ref 140–400)
RBC: 4.96 10*6/uL (ref 4.20–5.80)

## 2023-02-11 NOTE — Progress Notes (Signed)
MEDICARE ANNUAL WELLNESS VISIT AND FOLLOW UP Assessment:   Encounter for Medicare annual wellness exam Due Annually Health Maintenance Reviewed Healthy lifestyle reviewed and goals set  Labile hypertension At goal off of medications at this time Monitor blood pressure at home; call if consistently over 130/80 Continue DASH diet.   Reminder to go to the ER if any CP, SOB, nausea, dizziness, severe HA, changes vision/speech, left arm numbness and tingling and jaw pain.  ASHD (arteriosclerotic heart disease) Continue bASA Continue statin  Aortic atherosclerosis (HCC) by Abd CTscan 2019 Per numerous CTs Control blood pressure, cholesterol, glucose, increase exercise.  Continue bASA Continue statin  Thoracic aortic aneurysm without rupture, unspecified part (HCC) Continue to monitor   Non-seasonal allergic rhinitis, unspecified trigger Continue OTC antihistamine PRN Avoid triggers  Gastroesophageal reflux disease, unspecified whether esophagitis present Avoid triggers. Monitor diet. Avoid lying down until 2 hr after meals. OTC medications/acid reducer PRN  Malignant neoplasm of sigmoid colon (HCC) Continue follow up Dr. Dulce Sellar and Dr. Alcide Evener as recommended; monitor  Hyperlipidemia, mixed Continue statin Continue lifestyle modifications.  Vitamin D deficiency At goal at recent check; continue to recommend supplementation for goal of 70-100 Defer vitamin D level  Elevated PSA Monitor PSA levels Checked at CPE  Prediabetes Education: Reviewed 'ABCs' of diabetes management  Discussed goals to be met and/or maintained include A1C (<7) Blood pressure (<130/80) Cholesterol (LDL <70) Continue Eye Exam yearly  Continue Dental Exam Q6 mo Discussed dietary recommendations Discussed Physical Activity recommendations Check A1C  Medication management All medications discussed and reviewed in full. All questions and concerns regarding medications addressed.     Constipation Start daily Senokot Stay well hydrated Remain active  Orders Placed This Encounter  Procedures   CBC with Differential/Platelet   COMPLETE METABOLIC PANEL WITH GFR   Lipid panel   Hemoglobin A1c   VITAMIN D 25 Hydroxy (Vit-D Deficiency, Fractures)    Notify office for further evaluation and treatment, questions or concerns if any reported s/s fail to improve.   The patient was advised to call back or seek an in-person evaluation if any symptoms worsen or if the condition fails to improve as anticipated.   Further disposition pending results of labs. Discussed med's effects and SE's.    I discussed the assessment and treatment plan with the patient. The patient was provided an opportunity to ask questions and all were answered. The patient agreed with the plan and demonstrated an understanding of the instructions.  Discussed med's effects and SE's. Screening labs and tests as requested with regular follow-up as recommended.  I provided 35 minutes of face-to-face time during this encounter including counseling, chart review, and critical decision making was preformed.  Today's Plan of Care is based on a patient-centered health care approach known as shared decision making - the decisions, tests and treatments allow for patient preferences and values to be balanced with clinical evidence.     Future Appointments  Date Time Provider Department Center  02/27/2023 12:30 PM Alleen Borne, MD [facility]  05/21/2023  2:30 PM Lucky Cowboy, MD [facility] None  11/13/2023 11:00 AM Lucky Cowboy, MD [facility] None      Plan:   During the course of the visit the patient was educated and counseled about appropriate screening and preventive services including:   Pneumococcal vaccine  Influenza vaccine Td vaccine Screening electrocardiogram Colorectal cancer screening Diabetes screening Glaucoma screening Nutrition counseling    Subjective:  Keith [name] is a 89 y.o. male who  presents for Medicare Annual Wellness Visit and 3 month follow up for HTN, hyperlipidemia, prediabetes, and vitamin D Def.    Overall he reports feeling well today.    He did have an ER visit on 01/20/23 for CP.  Has a known thoracic aortic aneurysm and abdominal aortic aneurysm with current monitoring.  EKG showed sinus rhythm, no acute ischemic changes.  CXR was normal.  Blood work reassuring. After IV meds and GI cocktail pain improved.  Overall work up negative.  Continues to follow with Dr. Laneta Simmers, Cardiothoracic   Patient was diagnosed with colon cancer in Oct 2016, s/p resection 08/02/2015, completed final cycle of chemotherapy on 02/09/2016, monitoring by serial CTs by Dr. Alcide Evener.  Continues to f/u PRN.   He does have daily constipation, reports bristol 1 type stool.  Has hemorrhoids.  Tries to stay well hydrated.  He does not take a daily stool softener.   He has GERD recently well controlled via diet, avoiding triggers; hasn't needed PRN medication in several months.   BMI is Body mass index is 22.87 kg/m., he has been working on diet and exercise, walks 30-40 min daily.  Wt Readings from Last 3 Encounters:  02/11/23 159 lb 6.4 oz (72.3 kg)  01/20/23 161 lb 9.6 oz (73.3 kg)  11/07/22 161 lb 9.6 oz (73.3 kg)   His blood pressure has been controlled at home, today their BP: (!) 150/74  He does workout, walks and goes to the [facility] . He had a normal stress test 2008, EF 86%, had PTCA in 1991.  He denies chest pain, shortness of breath, dizziness.   He has known aortic atherosclerosis per multiple CTs, thoracic ascending aortic aneurysm last imaged in 12/2019 with interval enlargement 4.0 < 4.2 cm, recommended annual CTA/MRA. Also with infrarenal [place], most recently seen on CT abd 10/2019 showing 3.3 cm infrarenal abdominal aortic aneurysm. Recommend followup by ultrasound in 3 years (due 11/2022).   He is on cholesterol medication (atorvastatin 20 mg daily) and  denies myalgias. His cholesterol is at goal. The cholesterol last visit was:   Lab Results  Component Value Date   CHOL 137 11/07/2022   HDL 45 11/07/2022   LDLCALC 67 11/07/2022   TRIG 176 (H) 11/07/2022   CHOLHDL 3.0 11/07/2022   He has been working on diet and exercise for hx of prediabetes (6.0 in 2016, 5.7 in 11/2018), and denies paresthesia of the feet, polydipsia and polyuria. Last A1C in the office was:  Lab Results  Component Value Date   HGBA1C 5.8 (H) 11/07/2022   Patient is on Vitamin D supplement.   Lab Results  Component Value Date   VD25OH 47 11/07/2022    He has hx of elevated PSAs checked annually at CPE:  Lab Results  Component Value Date   PSA 2.75 11/07/2022   PSA 2.65 11/02/2021   PSA 3.00 10/19/2020  CT pelvis 08/2018 showed hypertrophy but no concerning lesions    Medication Review: Current Outpatient Medications on File Prior to Visit  Medication Sig Dispense Refill   aspirin 81 MG tablet Take 81 mg by mouth every morning.      atorvastatin (LIPITOR) 20 MG tablet Take  1 tablet  Daily  for Cholesterol                                                          /  TAKE BY                                MOUTH                                   ONCE DAILY 90 tablet 3   Cholecalciferol (VITAMIN D PO) Take 5,000 Int'l Units by mouth every morning.      ipratropium (ATROVENT) 0.06 % nasal spray USE 1 TO 2 SPRAY(S) IN EACH NOSTRIL TWICE DAILY TO THREE TIMES DAILY AS NEEDED 45 mL 3   Magnesium 250 MG TABS Take 1 tablet by mouth daily.     Omega-3 Fatty Acids (FISH OIL PO) Take 1 tablet by mouth every morning.      triamcinolone ointment (KENALOG) 0.1 % Apply 1 Application topically 2 (two) times daily. 80 g 3   No current facility-administered medications on file prior to visit.    Current Problems (verified) Patient Active Problem List   Diagnosis Date Noted   Chronic vasomotor rhinitis 02/17/2021   Aortic atherosclerosis (HCC)  by Abd CTscan 2019 12/23/2019   Thoracic aortic aneurysm (HCC) 12/23/2019   ASHD (arteriosclerotic heart disease) 08/13/2018   History of rectal cancer 08/13/2018   Non-seasonal allergic rhinitis 05/12/2018   Colon cancer (HCC) 07/15/2015   Prediabetes 09/14/2013   Labile hypertension    Hyperlipidemia, mixed    Gastroesophageal reflux disease    Vitamin D deficiency    Elevated PSA     Screening Tests Immunization History  Administered Date(s) Administered   Fluad Quad(high Dose 65+) 06/09/2019   Influenza Split 07/29/2013, 06/22/2015   Influenza, High Dose Seasonal PF 06/15/2014, 06/12/2016, 07/04/2022   Influenza-Unspecified 06/04/2017, 06/10/2018, 06/17/2020   Moderna Sars-Covid-2 Vaccination 06/14/2021   PFIZER Comirnaty(Gray Top)Covid-19 Tri-Sucrose Vaccine 07/04/2022   PFIZER(Purple Top)SARS-COV-2 Vaccination 10/27/2019, 11/18/2019, 07/08/2020   Pneumococcal Conjugate-13 06/15/2014   Pneumococcal Polysaccharide-23 03/12/2016   Td 02/09/2010, 07/01/2020   Zoster, Live 10/08/2000   Preventative care: Last colonoscopy: Virtual CT colonocopy in 10/2019, no further follow ups   CT chest: 13/2021 - aortic atherosclerosis, stable ascending aneurysm 4 cm CT abd/pelvis: 12/2019 -  infrarenal 3 cm  Prior vaccinations: TD or Tdap: 2011 defer boosting for now Influenza: 2023 Pneumococcal: 2004, 2017 Prevnar 13: 2015 Shingles/Zostavax: 2002  Covid 19: 3/3, 2021  Names of Other Physician/Practitioners you currently use: 1. [facility] here for primary care 1) Dr. Joseph Art, eye doctor, 2023,  2) Dr. Cain Saupe, last visit 2024 - goes q45m  Patient Care Team: Lucky Cowboy, MD as PCP - General (Internal Medicine) Elliot Cousin, OD (Optometry) Marcine Matar, MD as Consulting Physician (Urology) Alveta Heimlich, MD as Referring Physician (Orthopedic Surgery) Wandalee Ferdinand, RN as Registered Nurse (Medical Oncology) Ladene Artist,  MD as Consulting Physician (Oncology) Willis Modena, MD as Consulting Physician (Gastroenterology)   History reviewed: allergies, current medications, past family history, past medical history, past social history, past surgical history and problem list  Family History  Problem Relation Age of Onset   Heart disease Mother    Heart disease Father    Past Surgical History:  Procedure Laterality Date   CATARACT EXTRACTION, BILATERAL Bilateral 2021   Dr. Dione Booze   EUS N/A 06/29/2015   Procedure: LOWER ENDOSCOPIC ULTRASOUND (EUS);  Surgeon: Willis Modena, MD;  Location: [facility];  Service: Endoscopy;  Laterality:  N/A;   HERNIA REPAIR     left inguinal   XI ROBOTIC ASSISTED LOWER ANTERIOR RESECTION N/A 08/02/2015   Procedure: XI ROBOTIC ASSISTED LOWER ANTERIOR RESECTION;  Surgeon: Almond Lint, MD;  Location: [facility];  Service: General;  Laterality: N/A;    MEDICARE WELLNESS OBJECTIVES: Physical activity:   Cardiac risk factors:   Depression/mood screen:      02/11/2023    4:41 PM  Depression screen PHQ 2/9  Decreased Interest 0  Down, Depressed, Hopeless 0  PHQ - 2 Score 0    ADLs:     02/11/2023    4:36 PM 05/13/2022    9:22 PM  In your present state of health, do you have any difficulty performing the following activities:  Hearing? 0 0  Vision? 0 0  Difficulty concentrating or making decisions? 0 0  Walking or climbing stairs? 0 0  Dressing or bathing? 0 0  Doing errands, shopping? 0 0  Preparing Food and eating ? N   Using the Toilet? N   In the past six months, have you accidently leaked urine? N   Do you have problems with loss of bowel control? N   Managing your Medications? N   Managing your Finances? N   Housekeeping or managing your Housekeeping? N      Cognitive Testing  Alert? Yes  Normal Appearance?Yes  Oriented to person? Yes  Place? Yes   Time? Yes  Recall of three objects?  Yes  Can perform simple calculations? Yes  Displays appropriate  judgment?Yes  Can read the correct time from a watch face?Yes  EOL planning: Does Patient Have a Medical Advance Directive?: Yes Type of Advance Directive: Living will     Objective:   Blood pressure (!) 150/74, pulse (!) 53, temperature (!) 97.5 F (36.4 C), height 5\' 10"  (1.778 m), weight 159 lb 6.4 oz (72.3 kg), SpO2 98 %. Body mass index is 22.87 kg/m.  General appearance: alert, no distress, WD/WN, male HEENT: normocephalic, sclerae anicteric, TMs pearly, nares patent, no discharge or erythema, pharynx normal Oral cavity: MMM, no lesions Neck: supple, no lymphadenopathy, no thyromegaly, no masses Heart: RRR, normal S1, S2, no murmurs Lungs: CTA bilaterally, no wheezes, rhonchi, or rales Abdomen: +bs, soft, non tender, non distended, no masses, no hepatomegaly, no splenomegaly Musculoskeletal: nontender, no swelling, no obvious deformity Extremities: no edema, no cyanosis, no clubbing Pulses: 2+ symmetric, upper and lower extremities, normal cap refill Neurological: alert, oriented x 3, CN2-12 intact, strength normal upper extremities and lower extremities, sensation normal throughout, DTRs 2+ throughout, no cerebellar signs, gait normal Psychiatric: normal affect, behavior normal, pleasant   Medicare Attestation I have personally reviewed: The patient's medical and social history Their use of alcohol, tobacco or illicit drugs Their current medications and supplements The patient's functional ability including ADLs,fall risks, home safety risks, cognitive, and hearing and visual impairment Diet and physical activities Evidence for depression or mood disorders  The patient's weight, height, BMI, and visual acuity have been recorded in the chart.  I have made referrals, counseling, and provided education to the patient based on review of the above and I have provided the patient with a written personalized care plan for preventive services.     Adela Glimpse, NP   02/11/2023

## 2023-02-11 NOTE — Patient Instructions (Signed)

## 2023-02-12 LAB — LIPID PANEL
Cholesterol: 140 mg/dL (ref <200)
HDL: 48 mg/dL (ref >=40)
LDL Cholesterol (Calc): 70 mg/dL (calc)
Non-HDL Cholesterol (Calc): 92 mg/dL (calc) (ref <130)
Total CHOL/HDL Ratio: 2.9 (calc) (ref <5.0)
Triglycerides: 138 mg/dL (ref <150)

## 2023-02-12 LAB — COMPLETE METABOLIC PANEL WITH GFR
AG Ratio: 1.6 (calc) (ref 1.0–2.5)
ALT: 14 U/L (ref 9–46)
AST: 19 U/L (ref 10–35)
Albumin: 4.1 g/dL (ref 3.6–5.1)
Alkaline phosphatase (APISO): 62 U/L (ref 35–144)
BUN/Creatinine Ratio: 23 (calc) — ABNORMAL HIGH (ref 6–22)
BUN: 16 mg/dL (ref 7–25)
CO2: 31 mmol/L (ref 20–32)
Calcium: 9.5 mg/dL (ref 8.6–10.3)
Chloride: 102 mmol/L (ref 98–110)
Creat: 0.69 mg/dL — ABNORMAL LOW (ref 0.70–1.22)
Globulin: 2.6 g/dL (calc) (ref 1.9–3.7)
Glucose, Bld: 85 mg/dL (ref 65–99)
Potassium: 4.8 mmol/L (ref 3.5–5.3)
Sodium: 140 mmol/L (ref 135–146)
Total Bilirubin: 1.3 mg/dL — ABNORMAL HIGH (ref 0.2–1.2)
Total Protein: 6.7 g/dL (ref 6.1–8.1)
eGFR: 89 mL/min/{1.73_m2} (ref >=60)

## 2023-02-12 LAB — HEMOGLOBIN A1C
Hgb A1c MFr Bld: 5.9 % of total Hgb — ABNORMAL HIGH (ref <5.7)
Mean Plasma Glucose: 123 mg/dL
eAG (mmol/L): 6.8 mmol/L

## 2023-02-12 LAB — CBC WITH DIFFERENTIAL/PLATELET
Absolute Monocytes: 660 cells/uL (ref 200–950)
Eosinophils Absolute: 342 cells/uL (ref 15–500)
Eosinophils Relative: 5.7 %
HCT: 44.7 % (ref 38.5–50.0)
Hemoglobin: 14.7 g/dL (ref 13.2–17.1)
MCH: 29.6 pg (ref 27.0–33.0)
MCHC: 32.9 g/dL (ref 32.0–36.0)
Monocytes Relative: 11 %
Neutro Abs: 3348 cells/uL (ref 1500–7800)
Neutrophils Relative %: 55.8 %
RDW: 12.2 % (ref 11.0–15.0)
Total Lymphocyte: 26.7 %
WBC: 6 10*3/uL (ref 3.8–10.8)

## 2023-02-12 LAB — VITAMIN D 25 HYDROXY (VIT D DEFICIENCY, FRACTURES): Vit D, 25-Hydroxy: 86 ng/mL (ref 30–100)

## 2023-02-27 ENCOUNTER — Encounter: Payer: Self-pay | Admitting: Surgery

## 2023-02-27 ENCOUNTER — Other Ambulatory Visit: Payer: Medicare HMO

## 2023-02-27 ENCOUNTER — Ambulatory Visit: Payer: Medicare HMO | Admitting: Surgery

## 2023-02-27 VITALS — BP 150/70 | HR 60 | Resp 20 | Wt 160.0 lb

## 2023-02-27 DIAGNOSIS — I712 Thoracic aortic aneurysm, without rupture, unspecified: Secondary | ICD-10-CM | POA: Diagnosis not present

## 2023-02-27 NOTE — Progress Notes (Signed)
    HPI: ***  Current Outpatient Medications  Medication Sig Dispense Refill   aspirin 81 MG tablet Take 81 mg by mouth every morning.      atorvastatin (LIPITOR) 20 MG tablet Take  1 tablet  Daily  for Cholesterol                                                          /                                 TAKE BY                                MOUTH                                   ONCE DAILY 90 tablet 3   Cholecalciferol (VITAMIN D PO) Take 5,000 Int'l Units by mouth every morning.      ipratropium (ATROVENT) 0.06 % nasal spray USE 1 TO 2 SPRAY(S) IN EACH NOSTRIL TWICE DAILY TO THREE TIMES DAILY AS NEEDED 45 mL 3   Magnesium 250 MG TABS Take 1 tablet by mouth daily.     Omega-3 Fatty Acids (FISH OIL PO) Take 1 tablet by mouth every morning.      triamcinolone ointment (KENALOG) 0.1 % Apply 1 Application topically 2 (two) times daily. 80 g 3   No current facility-administered medications for this visit.     Physical Exam: ***  Diagnostic Tests: ***  Impression: ***  Plan: ***   Alleen Borne, MD [facility] [phone]

## 2023-05-20 NOTE — Progress Notes (Unsigned)
Future Appointments  Date Time Provider Department  02/11/2023           had Medicare Wellness   Tonya   05/21/2023                      6 mo ov   2:30 PM Lucky Cowboy, MD [facility]  11/13/2023                        cpe 11:00 AM Lucky Cowboy, MD [facility]    History of Present Illness:       This very nice  89 y.o. MWM  presents for 6 month follow up with HTN, Aortic Aneurysms, HLD, Pre-Diabetes and Vitamin D Deficiency.  Patient's GERD is controlled on his meds.  Patient is released now by Dr Truett Perna -  s/p 5 year surveillance post excision of a rectal Ca in 2016.         Patient is followed expectantly with  labile  HTN & BP has been controlled at home. Today's BP is at goal -  124/60 .  In 1981, patient had PCA for ACS and in 2008, he had a Negative Cardiolite. Patient also is followed by [facility] / Dr Laneta Simmers for a 4.5 cm Asc Ao Aneurysm and Dr Myra Gianotti  for a 3.2 cm AAA.  Patient has had no complaints of any cardiac type chest pain, palpitations, dyspnea Pollyann Kennedy /PND, dizziness, claudication  or dependent edema.        Hyperlipidemia is controlled with diet & meds. Patient denies myalgias or other med SE's. Last Lipids were at goal :   Lab Results  Component Value Date   CHOL 140 02/11/2023   HDL 48 02/11/2023   LDLCALC 70 02/11/2023   TRIG 138 02/11/2023   CHOLHDL 2.9 02/11/2023     Also, the patient has history of PreDiabetes (A1c 5.9% /2014) and has had no symptoms of reactive hypoglycemia, diabetic polys, paresthesias or visual blurring.  Last A1c was near goal :   Lab Results  Component Value Date   HGBA1C 5.9 (H) 02/11/2023                                                      Further, the patient also has history of Vitamin D Deficiency ( "27" /2009 ) and supplements vitamin D without any suspected side-effects. Last vitamin D was at goal:   Lab Results  Component Value Date   VD25OH 86 02/11/2023       Current Outpatient Medications on File  Prior to Visit  Medication Sig   aspirin 81 MG tablet Take every morning.    atorvastatin 20 MG tablet Take  1 tablet  Daily     VITAMIN D 5,000  Units  Take every morning.    ipratropium (ATROVENT) 0.06 % nasal spray Use 1 to 2 sprays each nostril 2 to 3 x /day as needed   Magnesium 250 MG TABS Take 1 tablet daily.   Omega-3 FISH OIL  Take 1 tablet every morning.    triamcinolone ointment 0.1 % Apply 1 application topically 2 (two) times daily.     Allergies  Allergen Reactions   Penicillins     PMHx:   Past Medical History:  Diagnosis Date   Cancer (  HCC)    cancer of recto-sigmoid junction-dx 07/2015   Elevated PSA    GERD (gastroesophageal reflux disease)    Hemorrhoids    Hyperlipidemia    Hypertension    Pneumonia    age 54   Vitamin D deficiency      Immunization History  Administered Date(s) Administered   Fluad Quad  (high Dose) 06/09/2019   Influenza Split 07/29/2013, 06/22/2015   Influenza, High Dose  06/15/2014, 06/12/2016   Influenza-Unspecified 06/04/2017, 06/10/2018, 06/17/2020   PFIZER-SARS-COV-2 Vacc 10/27/2019, 11/18/2019, 07/08/2020   Pneumococcal -13 06/15/2014   Pneumococcal -23 03/12/2016   Td 02/09/2010, 07/01/2020   Zoster, Live 10/08/2000     Past Surgical History:  Procedure Laterality Date   CATARACT EXTRACTION, BILATERAL Bilateral 2021   Dr. Dione Booze   EUS N/A 06/29/2015   Procedure: LOWER ENDOSCOPIC ULTRASOUND (EUS);  Surgeon: Willis Modena, MD;  Location: [facility];  Service: Endoscopy;  Laterality: N/A;   HERNIA REPAIR     left inguinal   XI ROBOTIC ASSISTED LOWER ANTERIOR RESECTION N/A 08/02/2015   Procedure: XI ROBOTIC ASSISTED LOWER ANTERIOR RESECTION;  Surgeon: Almond Lint, MD;  Location: [facility];  Service: General;  Laterality: N/A;    FHx:    Reviewed / unchanged  SHx:    Reviewed / unchanged   Systems Review:  Constitutional: Denies fever, chills, wt changes, headaches, insomnia, fatigue, night sweats, change in  appetite. Eyes: Denies redness, blurred vision, diplopia, discharge, itchy, watery eyes.  ENT: Denies discharge, congestion, post nasal drip, epistaxis, sore throat, earache, hearing loss, dental pain, tinnitus, vertigo, sinus pain, snoring.  CV: Denies chest pain, palpitations, irregular heartbeat, syncope, dyspnea, diaphoresis, orthopnea, PND, claudication or edema. Respiratory: denies cough, dyspnea, DOE, pleurisy, hoarseness, laryngitis, wheezing.  Gastrointestinal: Denies dysphagia, odynophagia, heartburn, reflux, water brash, abdominal pain or cramps, nausea, vomiting, bloating, diarrhea, constipation, hematemesis, melena, hematochezia  or hemorrhoids. Genitourinary: Denies dysuria, frequency, urgency, nocturia, hesitancy, discharge, hematuria or flank pain. Musculoskeletal: Denies arthralgias, myalgias, stiffness, jt. swelling, pain, limping or strain/sprain.  Skin: Denies pruritus, rash, hives, warts, acne, eczema or change in skin lesion(s). Neuro: No weakness, tremor, incoordination, spasms, paresthesia or pain. Psychiatric: Denies confusion, memory loss or sensory loss. Endo: Denies change in weight, skin or hair change.  Heme/Lymph: No excessive bleeding, bruising or enlarged lymph nodes.  Physical Exam  BP 124/60   Pulse (!) 51   Temp 97.9 F (36.6 C)   Resp 16   Ht 5\' 10"  (1.778 m)   Wt 160 lb 3.2 oz (72.7 kg)   SpO2 97%   BMI 22.99 kg/m   Appears  well nourished, well groomed  and in no distress.  Eyes: PERRLA, EOMs, conjunctiva no swelling or erythema. Sinuses: No frontal/maxillary tenderness ENT/Mouth: EAC's clear, TM's nl w/o erythema, bulging. Nares clear w/o erythema, swelling, exudates. Oropharynx clear without erythema or exudates. Oral hygiene is good. Tongue normal, non obstructing. Hearing intact.  Neck: Supple. Thyroid not palpable. Car 2+/2+ without bruits, nodes or JVD. Chest: Respirations nl with BS clear & equal w/o rales, rhonchi, wheezing or stridor.   Cor: Heart sounds normal w/ regular rate and rhythm without sig. murmurs, gallops, clicks or rubs. Peripheral pulses normal and equal  without edema.  Abdomen: Soft & bowel sounds normal. Non-tender w/o guarding, rebound, hernias, masses or organomegaly.  Lymphatics: Unremarkable.  Musculoskeletal: Full ROM all peripheral extremities, joint stability, 5/5 strength and normal gait.  Skin: Warm, dry without exposed rashes, lesions or ecchymosis apparent.  Neuro: Cranial nerves intact,  reflexes equal bilaterally. Sensory-motor testing grossly intact. Tendon reflexes grossly intact.  Pysch: Alert & oriented x 3.  Insight and judgement nl & appropriate. No ideations.  Assessment and Plan:  1. Labile hypertension  - Continue medication, monitor blood pressure at home.  - Continue DASH diet.  Reminder to go to the ER if any CP,  SOB, nausea, dizziness, severe HA, changes vision/speech.   - CBC with Differential/Platelet - COMPLETE METABOLIC PANEL WITH GFR - Magnesium - TSH  2. Hyperlipidemia, mixed  - Continue diet/meds, exercise,& lifestyle modifications.  - Continue monitor periodic cholesterol/liver & renal functions     - Lipid panel - TSH  3. Abnormal glucose  - Continue diet, exercise  - Lifestyle modifications.  - Monitor appropriate labs   - Hemoglobin A1c - Insulin, random  4. Vitamin D deficiency  - Continue supplementation   - VITAMIN D 25 Hydroxy   5. ASHD (arteriosclerotic heart disease)  - Lipid panel  6. Aortic atherosclerosis (HCC) by Abd CTscan 2019  - Lipid panel  7. Medication management  - CBC with Differential/Platelet - COMPLETE METABOLIC PANEL WITH GFR - Magnesium - Lipid panel - TSH - Hemoglobin A1c - Insulin, random - VITAMIN D 25 Hydroxy          Discussed  regular exercise, BP monitoring, weight control to achieve/maintain BMI less than 25 and discussed med and SE's. Recommended labs to assess and monitor clinical status with  further disposition pending results of labs.  I discussed the assessment and treatment plan with the patient. The patient was provided an opportunity to ask questions and all were answered. The patient agreed with the plan and demonstrated an understanding of the instructions.  I provided over 30 minutes of exam, counseling, chart review and  complex critical decision making.         The patient was advised to call back or seek an in-person evaluation if the symptoms worsen or if the condition fails to improve as anticipated.   Marinus Maw, MD.

## 2023-05-21 ENCOUNTER — Ambulatory Visit (INDEPENDENT_AMBULATORY_CARE_PROVIDER_SITE_OTHER): Payer: Medicare HMO | Admitting: Internal Medicine

## 2023-05-21 ENCOUNTER — Encounter: Payer: Self-pay | Admitting: Internal Medicine

## 2023-05-21 VITALS — BP 124/60 | HR 51 | Temp 97.9°F | Resp 16 | Ht 70.0 in | Wt 160.2 lb

## 2023-05-21 DIAGNOSIS — I712 Thoracic aortic aneurysm, without rupture, unspecified: Secondary | ICD-10-CM | POA: Diagnosis not present

## 2023-05-21 DIAGNOSIS — E559 Vitamin D deficiency, unspecified: Secondary | ICD-10-CM | POA: Diagnosis not present

## 2023-05-21 DIAGNOSIS — Z79899 Other long term (current) drug therapy: Secondary | ICD-10-CM

## 2023-05-21 DIAGNOSIS — R0989 Other specified symptoms and signs involving the circulatory and respiratory systems: Secondary | ICD-10-CM | POA: Diagnosis not present

## 2023-05-21 DIAGNOSIS — I700 Atherosclerosis of aorta: Secondary | ICD-10-CM | POA: Diagnosis not present

## 2023-05-21 DIAGNOSIS — I2510 Atherosclerotic heart disease of native coronary artery without angina pectoris: Secondary | ICD-10-CM | POA: Diagnosis not present

## 2023-05-21 DIAGNOSIS — R7309 Other abnormal glucose: Secondary | ICD-10-CM

## 2023-05-21 DIAGNOSIS — N528 Other male erectile dysfunction: Secondary | ICD-10-CM | POA: Diagnosis not present

## 2023-05-21 DIAGNOSIS — E782 Mixed hyperlipidemia: Secondary | ICD-10-CM | POA: Diagnosis not present

## 2023-05-21 MED ORDER — TADALAFIL 20 MG PO TABS
ORAL_TABLET | ORAL | 3 refills | Status: DC
Start: 2023-05-21 — End: 2023-05-22

## 2023-05-21 NOTE — Patient Instructions (Signed)

## 2023-05-22 ENCOUNTER — Other Ambulatory Visit: Payer: Self-pay | Admitting: Internal Medicine

## 2023-05-22 DIAGNOSIS — N528 Other male erectile dysfunction: Secondary | ICD-10-CM

## 2023-05-22 MED ORDER — TADALAFIL 20 MG PO TABS
ORAL_TABLET | ORAL | 3 refills | Status: DC
Start: 2023-05-22 — End: 2023-12-05

## 2023-05-22 NOTE — Progress Notes (Signed)
^<^<^<^<^<^<^<^<^<^<^<^<^<^<^<^<^<^<^<^<^<^<^<^<^<^<^<^<^<^<^<^<^<^<^<^<^ ^>^>^>^>^>^>^>^>^>^>^>>^>^>^>^>^>^>^>^>^>^>^>^>^>^>^>^>^>^>^>^>^>^>^>^>^>  -  Test results slightly outside the reference range are not unusual. If there is anything important, I will review this with you,  otherwise it is considered normal test values.  If you have further questions,  please do not hesitate to contact me at the office or via My Chart.   ^<^<^<^<^<^<^<^<^<^<^<^<^<^<^<^<^<^<^<^<^<^<^<^<^<^<^<^<^<^<^<^<^<^<^<^<^ ^>^>^>^>^>^>^>^>^>^>^>^>^>^>^>^>^>^>^>^>^>^>^>^>^>^>^>^>^>^>^>^>^>^>^>^>^  -  Chol =  127  &   LDL = 62  - Both   Excellent   - Very low risk for Heart Attack  / Stroke  ^>^>^>^>^>^>^>^>^>^>^>^>^>^>^>^>^>^>^>^>^>^>^>^>^>^>^>^>^>^>^>^>^>^>^>^>^ ^>^>^>^>^>^>^>^>^>^>^>^>^>^>^>^>^>^>^>^>^>^>^>^>^>^>^>^>^>^>^>^>^>^>^>^>^  -  A1c = 5.8 % = 12 week average blood sugar  s slightly elevated ,  So   - Avoid Sweets, Candy & White Stuff   - White Rice, White [place], White Flour  - Breads &  Pasta  ^<^<^<^<^<^<^<^<^<^<^<^<^<^<^<^<^<^<^<^<^<^<^<^<^<^<^<^<^<^<^<^<^<^<^<^<^ ^>^>^>^>^>^>^>^>^>^>^>^>^>^>^>^>^>^>^>^>^>^>^>^>^>^>^>^>^>^>^>^>^>^>^>^>^  -  Vitamin D = 68  - Excellent -  Please continue dosage same    ^<^<^<^<^<^<^<^<^<^<^<^<^<^<^<^<^<^<^<^<^<^<^<^<^<^<^<^<^<^<^<^<^<^<^<^<^ ^>^>^>^>^>^>^>^>^>^>^>^>^>^>^>^>^>^>^>^>^>^>^>^>^>^>^>^>^>^>^>^>^>^>^>^>^  -  All Else - CBC - Kidneys - Electrolytes - Liver - Magnesium & Thyroid    - all  Normal / OK  ^<^<^<^<^<^<^<^<^<^<^<^<^<^<^<^<^<^<^<^<^<^<^<^<^<^<^<^<^<^<^<^<^<^<^<^<^ ^>^>^>^>^>^>^>^>^>^>^>^>^>^>^>^>^>^>^>^>^>^>^>^>^>^>^>^>^>^>^>^>^>^>^>^>^

## 2023-06-25 DIAGNOSIS — H524 Presbyopia: Secondary | ICD-10-CM | POA: Diagnosis not present

## 2023-06-25 DIAGNOSIS — H35363 Drusen (degenerative) of macula, bilateral: Secondary | ICD-10-CM | POA: Diagnosis not present

## 2023-08-21 ENCOUNTER — Encounter: Payer: Self-pay | Admitting: Nurse Practitioner

## 2023-08-21 ENCOUNTER — Ambulatory Visit (INDEPENDENT_AMBULATORY_CARE_PROVIDER_SITE_OTHER): Payer: Medicare HMO | Admitting: Nurse Practitioner

## 2023-08-21 VITALS — BP 130/68 | HR 54 | Temp 97.8°F | Ht 70.0 in | Wt 160.6 lb

## 2023-08-21 DIAGNOSIS — Z79899 Other long term (current) drug therapy: Secondary | ICD-10-CM | POA: Diagnosis not present

## 2023-08-21 DIAGNOSIS — J3089 Other allergic rhinitis: Secondary | ICD-10-CM

## 2023-08-21 DIAGNOSIS — I2510 Atherosclerotic heart disease of native coronary artery without angina pectoris: Secondary | ICD-10-CM | POA: Diagnosis not present

## 2023-08-21 DIAGNOSIS — K5904 Chronic idiopathic constipation: Secondary | ICD-10-CM

## 2023-08-21 DIAGNOSIS — R7303 Prediabetes: Secondary | ICD-10-CM | POA: Diagnosis not present

## 2023-08-21 DIAGNOSIS — I700 Atherosclerosis of aorta: Secondary | ICD-10-CM

## 2023-08-21 DIAGNOSIS — Z85048 Personal history of other malignant neoplasm of rectum, rectosigmoid junction, and anus: Secondary | ICD-10-CM

## 2023-08-21 DIAGNOSIS — K219 Gastro-esophageal reflux disease without esophagitis: Secondary | ICD-10-CM

## 2023-08-21 DIAGNOSIS — R0989 Other specified symptoms and signs involving the circulatory and respiratory systems: Secondary | ICD-10-CM

## 2023-08-21 DIAGNOSIS — E782 Mixed hyperlipidemia: Secondary | ICD-10-CM | POA: Diagnosis not present

## 2023-08-21 DIAGNOSIS — R972 Elevated prostate specific antigen [PSA]: Secondary | ICD-10-CM

## 2023-08-21 DIAGNOSIS — R17 Unspecified jaundice: Secondary | ICD-10-CM

## 2023-08-21 DIAGNOSIS — L308 Other specified dermatitis: Secondary | ICD-10-CM

## 2023-08-21 DIAGNOSIS — I712 Thoracic aortic aneurysm, without rupture, unspecified: Secondary | ICD-10-CM

## 2023-08-21 DIAGNOSIS — C187 Malignant neoplasm of sigmoid colon: Secondary | ICD-10-CM

## 2023-08-21 DIAGNOSIS — E559 Vitamin D deficiency, unspecified: Secondary | ICD-10-CM

## 2023-08-21 MED ORDER — TRIAMCINOLONE ACETONIDE 0.1 % EX OINT
1.0000 | TOPICAL_OINTMENT | Freq: Two times a day (BID) | CUTANEOUS | 3 refills | Status: AC
Start: 2023-08-21 — End: ?

## 2023-08-21 NOTE — Progress Notes (Signed)
FOLLOW UP Assessment:   Labile hypertension At goal off of medications at this time Monitor blood pressure at home; call if consistently over 130/80 Continue DASH diet.   Reminder to go to the ER if any CP, SOB, nausea, dizziness, severe HA, changes vision/speech, left arm numbness and tingling and jaw pain.  ASHD (arteriosclerotic heart disease) Continue bASA Continue statin  Aortic atherosclerosis (HCC) by Abd CTscan 2019 Per numerous CTs Control blood pressure, cholesterol, glucose, increase exercise.  Continue bASA Continue statin  Thoracic aortic aneurysm without rupture, unspecified part (HCC) Continue to monitor   Non-seasonal allergic rhinitis, unspecified trigger Continue OTC antihistamine PRN Avoid triggers  Gastroesophageal reflux disease, unspecified whether esophagitis present Avoid triggers. Monitor diet. Avoid lying down until 2 hr after meals. OTC medications/acid reducer PRN  Malignant neoplasm of sigmoid colon (HCC) Continue follow up Dr. Dulce Sellar and Dr. Alcide Evener as recommended; monitor  Hyperlipidemia, mixed Continue statin Continue lifestyle modifications.  Vitamin D deficiency At goal at recent check; continue to recommend supplementation for goal of 70-100 Defer vitamin D level  Elevated PSA Monitor PSA levels Checked at CPE  Prediabetes Education: Reviewed 'ABCs' of diabetes management  Discussed goals to be met and/or maintained include A1C (<7) Blood pressure (<130/80) Cholesterol (LDL <70) Continue Eye Exam yearly  Continue Dental Exam Q6 mo Discussed dietary recommendations Discussed Physical Activity recommendations Check A1C  Medication management All medications discussed and reviewed in full. All questions and concerns regarding medications addressed.    Constipation Continue Senokot Stay well hydrated Remain active  Eczema Start topical triamcinolone - do not use >2 weeks Keep skin free from moisture - discussed  benefit of emollients.  Suggest daily antihistamine - Zyrtec 10 mg sample provided.  Elevated bilirubin Consistent - consider gilbert's syndrome Continue to monitor  Orders Placed This Encounter  Procedures   CBC with Differential/Platelet   COMPLETE METABOLIC PANEL WITH GFR   Lipid panel   Hemoglobin A1c   Meds ordered this encounter  Medications   triamcinolone ointment (KENALOG) 0.1 %    Sig: Apply 1 Application topically 2 (two) times daily.    Dispense:  80 g    Refill:  3    Order Specific Question:   Supervising Provider    Answer:   Lucky Cowboy [phone]   Notify office for further evaluation and treatment, questions or concerns if any reported s/s fail to improve.   The patient was advised to call back or seek an in-person evaluation if any symptoms worsen or if the condition fails to improve as anticipated.   Further disposition pending results of labs. Discussed med's effects and SE's.    I discussed the assessment and treatment plan with the patient. The patient was provided an opportunity to ask questions and all were answered. The patient agreed with the plan and demonstrated an understanding of the instructions.  Discussed med's effects and SE's. Screening labs and tests as requested with regular follow-up as recommended.  I provided 35 minutes of face-to-face time during this encounter including counseling, chart review, and critical decision making was preformed.  Today's Plan of Care is based on a patient-centered health care approach known as shared decision making - the decisions, tests and treatments allow for patient preferences and values to be balanced with clinical evidence.     Future Appointments  Date Time Provider Department Center  11/13/2023 11:00 AM Lucky Cowboy, MD [facility] None    Subjective:  Keith [name] is a 89 y.o. male who presents  for a 3 month follow up for HTN, hyperlipidemia, prediabetes, and vitamin D Def.    Overall  he reports feeling well today.    States increase in pruritis along upper back.  Has used OTC topical lotions which have not helped.  Some scaly scattered patches.  NO recent soaps, lotions, clothing, detergents.   He did have an ER visit on 01/20/23 for CP.  Has a known thoracic aortic aneurysm and abdominal aortic aneurysm with current monitoring.  EKG showed sinus rhythm, no acute ischemic changes.  CXR was normal.  Blood work reassuring. After IV meds and GI cocktail pain improved.  Overall work up negative.  Continues to follow with Dr. Laneta Simmers, Cardiothoracic   Patient was diagnosed with colon cancer in Oct 2016, s/p resection 08/02/2015, completed final cycle of chemotherapy on 02/09/2016, monitoring by serial CTs by Dr. Alcide Evener.  Continues to f/u PRN.   He does have daily constipation, reports bristol 1 type stool.  Has hemorrhoids.  Tries to stay well hydrated.  He is not on a daily stool softener and reports much improved.  He has GERD recently well controlled via diet, avoiding triggers; hasn't needed PRN medication in several months.   BMI is Body mass index is 23.04 kg/m., he has been working on diet and exercise, walks 30-40 min daily.  Wt Readings from Last 3 Encounters:  08/21/23 160 lb 9.6 oz (72.8 kg)  05/21/23 160 lb 3.2 oz (72.7 kg)  02/27/23 160 lb (72.6 kg)   His blood pressure has been controlled at home, today their BP: 130/68  He does workout, walks and goes to the [facility] . He had a normal stress test 2008, EF 86%, had PTCA in 1991.  He denies chest pain, shortness of breath, dizziness.   He has known aortic atherosclerosis per multiple CTs, thoracic ascending aortic aneurysm last imaged in 12/2019 with interval enlargement 4.0 < 4.2 cm, recommended annual CTA/MRA. Also with infrarenal [place], most recently seen on CT abd 10/2019 showing 3.3 cm infrarenal abdominal aortic aneurysm. Recommend followup by ultrasound in 3 years (due 11/2022).   He is on cholesterol medication  (atorvastatin 20 mg daily) and denies myalgias. His cholesterol is at goal. The cholesterol last visit was:   Lab Results  Component Value Date   CHOL 127 05/21/2023   HDL 41 05/21/2023   LDLCALC 62 05/21/2023   TRIG 162 (H) 05/21/2023   CHOLHDL 3.1 05/21/2023   He has been working on diet and exercise for hx of prediabetes (6.0 in 2016, 5.7 in 11/2018), and denies paresthesia of the feet, polydipsia and polyuria. Last A1C in the office was:  Lab Results  Component Value Date   HGBA1C 5.8 (H) 05/21/2023   Patient is on Vitamin D supplement.   Lab Results  Component Value Date   VD25OH 68 05/21/2023    He has hx of elevated PSAs checked annually at CPE:  Lab Results  Component Value Date   PSA 2.75 11/07/2022   PSA 2.65 11/02/2021   PSA 3.00 10/19/2020  CT pelvis 08/2018 showed hypertrophy but no concerning lesions   Continuous elevation in bilirubin - no known hx of gilbert's syndrome.    Medication Review: Current Outpatient Medications on File Prior to Visit  Medication Sig Dispense Refill   aspirin 81 MG tablet Take 81 mg by mouth every morning.      atorvastatin (LIPITOR) 20 MG tablet Take  1 tablet  Daily  for Cholesterol                                                          /  TAKE BY                                MOUTH                                   ONCE DAILY 90 tablet 3   Cholecalciferol (VITAMIN D PO) Take 5,000 Int'l Units by mouth every morning.      ipratropium (ATROVENT) 0.06 % nasal spray USE 1 TO 2 SPRAY(S) IN EACH NOSTRIL TWICE DAILY TO THREE TIMES DAILY AS NEEDED 45 mL 3   Magnesium 250 MG TABS Take 1 tablet by mouth daily.     Omega-3 Fatty Acids (FISH OIL PO) Take 1 tablet by mouth every morning.      tadalafil (CIALIS) 20 MG tablet Take 1/2 to 1 tablet every 2 to 3 days as needed for XXXX 30 tablet 3   No current facility-administered medications on file prior to visit.    Current Problems (verified) Patient Active  Problem List   Diagnosis Date Noted   Chronic idiopathic constipation 08/21/2023   Chronic vasomotor rhinitis 02/17/2021   Aortic atherosclerosis (HCC) by Abd CTscan 2019 12/23/2019   Thoracic aortic aneurysm (HCC) 12/23/2019   ASHD (arteriosclerotic heart disease) 08/13/2018   History of rectal cancer 08/13/2018   Non-seasonal allergic rhinitis 05/12/2018   Colon cancer (HCC) 07/15/2015   Prediabetes 09/14/2013   Labile hypertension    Hyperlipidemia, mixed    Gastroesophageal reflux disease    Vitamin D deficiency    Elevated PSA     Screening Tests Immunization History  Administered Date(s) Administered   Fluad Quad(high Dose 65+) 06/09/2019, 07/08/2023   Influenza Split 07/29/2013, 06/22/2015   Influenza, High Dose Seasonal PF 06/15/2014, 06/12/2016, 07/04/2022   Influenza-Unspecified 06/04/2017, 06/10/2018, 06/17/2020   Moderna Sars-Covid-2 Vaccination 06/14/2021   PFIZER Comirnaty(Gray Top)Covid-19 Tri-Sucrose Vaccine 07/04/2022   PFIZER(Purple Top)SARS-COV-2 Vaccination 10/27/2019, 11/18/2019, 07/08/2020, 07/09/2023   Pneumococcal Conjugate-13 06/15/2014   Pneumococcal Polysaccharide-23 03/12/2016   Td 02/09/2010, 07/01/2020   Zoster, Live 10/08/2000   Patient Care Team: Lucky Cowboy, MD as PCP - General (Internal Medicine) Elliot Cousin, OD (Optometry) Marcine Matar, MD as Consulting Physician (Urology) Alveta Heimlich, MD as Referring Physician (Orthopedic Surgery) Wandalee Ferdinand, RN as Registered Nurse (Medical Oncology) Ladene Artist, MD as Consulting Physician (Oncology) Willis Modena, MD as Consulting Physician (Gastroenterology)   History reviewed: allergies, current medications, past family history, past medical history, past social history, past surgical history and problem list  Family History  Problem Relation Age of Onset   Heart disease Mother    Heart disease Father    Past Surgical History:  Procedure Laterality Date   CATARACT  EXTRACTION, BILATERAL Bilateral 2021   Dr. Dione Booze   EUS N/A 06/29/2015   Procedure: LOWER ENDOSCOPIC ULTRASOUND (EUS);  Surgeon: Willis Modena, MD;  Location: [facility];  Service: Endoscopy;  Laterality: N/A;   HERNIA REPAIR     left inguinal   XI ROBOTIC ASSISTED LOWER ANTERIOR RESECTION N/A 08/02/2015   Procedure: XI ROBOTIC ASSISTED LOWER ANTERIOR RESECTION;  Surgeon: Almond Lint, MD;  Location: [facility];  Service: General;  Laterality: N/A;    Objective:   Blood pressure 130/68, pulse (!) 54, temperature 97.8 F (36.6 C), height 5\' 10"  (1.778 m), weight 160 lb 9.6 oz (72.8 kg), SpO2 99%. Body mass index is 23.04 kg/m.  General appearance: alert, no distress, WD/WN, male HEENT: normocephalic, sclerae anicteric, TMs pearly, nares patent, no discharge or erythema, pharynx normal Oral cavity: MMM, no lesions Neck: supple, no lymphadenopathy, no thyromegaly, no masses Heart: RRR, normal S1, S2, no murmurs Lungs: CTA bilaterally, no wheezes, rhonchi, or rales Abdomen: +bs, soft, non tender, non distended, no masses, no hepatomegaly, no splenomegaly Musculoskeletal: nontender, no swelling, no obvious deformity Extremities: no edema, no cyanosis, no clubbing Pulses: 2+ symmetric, upper and lower extremities, normal cap refill Neurological: alert, oriented x 3, CN2-12 intact, strength normal upper extremities and lower extremities, sensation normal throughout, DTRs 2+ throughout, no cerebellar signs, gait normal Psychiatric: normal affect, behavior normal, pleasant  Skin:  Scattered area of raised dry flaky skin approximately 0.5-1 cm round in size.     Adela Glimpse, NP   08/21/2023

## 2023-08-21 NOTE — Patient Instructions (Signed)
Eczema Eczema refers to a group of skin conditions that cause skin to become rough and inflamed. Each type of eczema has different triggers, symptoms, and treatments. Eczema of any type is usually itchy. Symptoms range from mild to severe. Eczema is not spread from person to person (is not contagious). It can appear on different parts of the body at different times. One person's eczema may look different from another person's eczema. What are the causes? The exact cause of this condition is not known. However, exposure to certain environmental factors, irritants, and allergens can make the condition worse. What are the signs or symptoms? Symptoms of this condition depend on the type of eczema you have. The types include: Contact dermatitis. There are two kinds: Irritant contact dermatitis. This happens when something irritates the skin and causes a rash. Allergic contact dermatitis. This happens when your skin comes in contact with something you are allergic to (allergens). This can include poison ivy, chemicals, or medicines that were applied to your skin. Atopic dermatitis. This is a long-term (chronic) skin disease that keeps coming back (recurring). It is the most common type of eczema. Usual symptoms are a red rash and itchy, dry, scaly skin. It usually starts showing signs in infancy and can last through adulthood. Dyshidrotic eczema. This is a form of eczema on the hands and feet. It shows up as very itchy, fluid-filled blisters. It can affect people of any age but is more common before age 40. Hand eczema. This causes very itchy areas of skin on the palms and sides of the hands and fingers. This type of eczema is common in industrial jobs where you may be exposed to different types of irritants. Lichen simplex chronicus. This type of eczema occurs when a person constantly scratches one area of the body. Repeated scratching of the area leads to thickened skin (lichenification). This condition can  accompany other types of eczema. It is more common in adults but may also be seen in children. Nummular eczema. This is a common type of eczema that most often affects the lower legs and the backs of the hands. It typically causes an itchy, red, circular, crusty lesion (plaque). Scratching may become a habit and can cause bleeding. Nummular eczema occurs most often in middle-aged or older people. Seborrheic dermatitis. This is a common skin disease that mainly affects the scalp. It may also affect other oily areas of the body, such as the face, sides of the nose, eyebrows, ears, eyelids, and chest. It is marked by small scaling and redness of the skin (erythema). This can affect people of all ages. In infants, this condition is called cradle cap. Stasis dermatitis. This is a common skin disease that can cause itching, scaling, and hyperpigmentation, usually on the legs and feet. It occurs most often in people who have a condition that prevents blood from being pumped through the veins in the legs (chronic venous insufficiency). Stasis dermatitis is a chronic condition that needs long-term management. How is this diagnosed? This condition may be diagnosed based on: A physical exam of your skin. Your medical history. Skin patch tests. These tests involve using patches that contain possible allergens and placing them on your back. Your health care provider will check in a few days to see if an allergic reaction occurred. How is this treated? Treatment for eczema is based on the type of eczema you have. You may be given hydrocortisone steroid medicine or antihistamines. These can relieve itching quickly and help reduce inflammation.   These may be prescribed or purchased over the counter, depending on the strength that is needed. Follow these instructions at home: Take or apply over-the-counter and prescription medicines only as told by your health care provider. Use creams or ointments to moisturize your  skin. Do not use lotions. Learn what triggers or irritates your symptoms so you can avoid these things. Treat symptom flare-ups quickly. Do not scratch your skin. This can make your rash worse. Keep all follow-up visits. This is important. Where to find more information American Academy of Dermatology: aad.org National Eczema Association: nationaleczema.org The Society for Pediatric Dermatology: pedsderm.net Contact a health care provider if: You have severe itching, even with treatment. You scratch your skin regularly until it bleeds. Your rash looks different than usual. Your skin is painful, swollen, or more red than usual. You have a fever. Summary Eczema refers to a group of skin conditions that cause skin to become rough and inflamed. Each type has different triggers. Eczema of any type causes itching that may range from mild to severe. Treatment varies based on the type of eczema you have. Hydrocortisone steroid medicine or antihistamines can help with itching and inflammation. Protecting your skin is the best way to prevent eczema. Use creams or ointments to moisturize your skin. Avoid triggers and irritants. Treat flare-ups quickly. This information is not intended to replace advice given to you by your health care provider. Make sure you discuss any questions you have with your health care provider. Document Revised: 07/01/2020 Document Reviewed: 07/04/2020 Elsevier Patient Education  2024 Elsevier Inc.  

## 2023-08-22 LAB — HEMOGLOBIN A1C
Hgb A1c MFr Bld: 5.8 %{Hb} — ABNORMAL HIGH (ref <5.7)
Mean Plasma Glucose: 120 mg/dL
eAG (mmol/L): 6.6 mmol/L

## 2023-08-22 LAB — COMPLETE METABOLIC PANEL WITH GFR
AG Ratio: 1.4 (calc) (ref 1.0–2.5)
ALT: 13 U/L (ref 9–46)
AST: 17 U/L (ref 10–35)
Albumin: 3.8 g/dL (ref 3.6–5.1)
Alkaline phosphatase (APISO): 64 U/L (ref 35–144)
BUN: 16 mg/dL (ref 7–25)
CO2: 30 mmol/L (ref 20–32)
Calcium: 9 mg/dL (ref 8.6–10.3)
Chloride: 101 mmol/L (ref 98–110)
Creat: 0.7 mg/dL (ref 0.70–1.22)
Globulin: 2.7 g/dL (ref 1.9–3.7)
Glucose, Bld: 91 mg/dL (ref 65–99)
Potassium: 4.5 mmol/L (ref 3.5–5.3)
Sodium: 138 mmol/L (ref 135–146)
Total Bilirubin: 1.3 mg/dL — ABNORMAL HIGH (ref 0.2–1.2)
Total Protein: 6.5 g/dL (ref 6.1–8.1)
eGFR: 88 mL/min/{1.73_m2} (ref >=60)

## 2023-08-22 LAB — CBC WITH DIFFERENTIAL/PLATELET
Absolute Lymphocytes: 1450 {cells}/uL (ref 850–3900)
Absolute Monocytes: 549 {cells}/uL (ref 200–950)
Basophils Absolute: 39 {cells}/uL (ref 0–200)
Basophils Relative: 0.7 %
Eosinophils Absolute: 319 {cells}/uL (ref 15–500)
Eosinophils Relative: 5.7 %
HCT: 44.6 % (ref 38.5–50.0)
Hemoglobin: 14.4 g/dL (ref 13.2–17.1)
MCH: 29.5 pg (ref 27.0–33.0)
MCHC: 32.3 g/dL (ref 32.0–36.0)
MCV: 91.4 fL (ref 80.0–100.0)
MPV: 10.6 fL (ref 7.5–12.5)
Monocytes Relative: 9.8 %
Neutro Abs: 3242 {cells}/uL (ref 1500–7800)
Neutrophils Relative %: 57.9 %
Platelets: 170 10*3/uL (ref 140–400)
RBC: 4.88 10*6/uL (ref 4.20–5.80)
RDW: 12.2 % (ref 11.0–15.0)
Total Lymphocyte: 25.9 %
WBC: 5.6 10*3/uL (ref 3.8–10.8)

## 2023-08-22 LAB — LIPID PANEL
Cholesterol: 139 mg/dL (ref <200)
HDL: 42 mg/dL (ref >=40)
LDL Cholesterol (Calc): 71 mg/dL
Non-HDL Cholesterol (Calc): 97 mg/dL (ref <130)
Total CHOL/HDL Ratio: 3.3 (calc) (ref <5.0)
Triglycerides: 179 mg/dL — ABNORMAL HIGH (ref <150)

## 2023-10-29 ENCOUNTER — Other Ambulatory Visit: Payer: Self-pay

## 2023-10-29 DIAGNOSIS — E782 Mixed hyperlipidemia: Secondary | ICD-10-CM

## 2023-10-29 MED ORDER — ATORVASTATIN CALCIUM 20 MG PO TABS
ORAL_TABLET | ORAL | 3 refills | Status: DC
Start: 2023-10-29 — End: 2024-01-30

## 2023-11-06 DIAGNOSIS — Z008 Encounter for other general examination: Secondary | ICD-10-CM | POA: Diagnosis not present

## 2023-11-13 ENCOUNTER — Encounter: Payer: Medicare HMO | Admitting: Internal Medicine

## 2023-12-05 ENCOUNTER — Ambulatory Visit (INDEPENDENT_AMBULATORY_CARE_PROVIDER_SITE_OTHER): Payer: Medicare HMO | Admitting: Family Medicine

## 2023-12-05 ENCOUNTER — Encounter: Payer: Self-pay | Admitting: Family Medicine

## 2023-12-05 VITALS — BP 124/72 | HR 70 | Temp 97.6°F | Ht 70.0 in | Wt 162.0 lb

## 2023-12-05 DIAGNOSIS — L309 Dermatitis, unspecified: Secondary | ICD-10-CM

## 2023-12-05 DIAGNOSIS — R6889 Other general symptoms and signs: Secondary | ICD-10-CM

## 2023-12-05 DIAGNOSIS — R7303 Prediabetes: Secondary | ICD-10-CM

## 2023-12-05 DIAGNOSIS — I700 Atherosclerosis of aorta: Secondary | ICD-10-CM | POA: Diagnosis not present

## 2023-12-05 DIAGNOSIS — E782 Mixed hyperlipidemia: Secondary | ICD-10-CM | POA: Diagnosis not present

## 2023-12-05 DIAGNOSIS — E559 Vitamin D deficiency, unspecified: Secondary | ICD-10-CM | POA: Diagnosis not present

## 2023-12-05 DIAGNOSIS — I7121 Aneurysm of the ascending aorta, without rupture: Secondary | ICD-10-CM | POA: Diagnosis not present

## 2023-12-05 DIAGNOSIS — R011 Cardiac murmur, unspecified: Secondary | ICD-10-CM | POA: Diagnosis not present

## 2023-12-05 DIAGNOSIS — J300 Vasomotor rhinitis: Secondary | ICD-10-CM | POA: Diagnosis not present

## 2023-12-05 DIAGNOSIS — I714 Abdominal aortic aneurysm, without rupture, unspecified: Secondary | ICD-10-CM | POA: Diagnosis not present

## 2023-12-05 NOTE — Patient Instructions (Addendum)
 VISIT SUMMARY:  Today, we reviewed your medications and followed up on your cholesterol management. We also discussed your history of aortic aneurysms, cardiovascular health, and chronic rash. A heart murmur was detected during your physical exam, and we have made several referrals for further evaluation.  YOUR PLAN:  -HEART MURMUR: A heart murmur is an unusual sound heard between heartbeats, which can indicate underlying heart issues. Given your cardiovascular history, we recommend a comprehensive evaluation by a cardiologist. You will be referred to cardiology for further assessment.  -THORACIC AORTIC ANEURYSM: A thoracic aortic aneurysm is an enlargement of the upper part of the aorta. Your aneurysm is stable at 4.3 cm, and surgery is not recommended due to the high risk at your age. No further imaging or follow-up is required at this time.  -ABDOMINAL AORTIC ANEURYSM: An abdominal aortic aneurysm is an enlargement of the lower part of the aorta. Your aneurysm measures 3.6 cm, and continued surveillance is recommended. Please schedule a follow-up with Dr. Myra Gianotti in one year for management.  -CHRONIC RASH: You have a chronic rash on your back that has not responded to triamcinolone ointment. We recommend seeing a dermatologist to rule out skin cancer and consider a biopsy if necessary.  -HYPERLIPIDEMIA: Hyperlipidemia is having high levels of fats (lipids) in your blood. Your cholesterol levels are stable with atorvastatin, so no changes in medication are needed. Continue your current statin therapy and monitor cholesterol levels annually.  -GENERAL HEALTH MAINTENANCE: Your recent lab work shows stable metabolic panel, kidney function, electrolytes, liver function, and no anemia. Your hemoglobin A1c is 5.8, indicating prediabetes. Continue your current supplements and monitor hemoglobin A1c annually.  INSTRUCTIONS:  - Schedule follow-up with Dr. Myra Gianotti in one year for abdominal aortic  aneurysm. - Refer to cardiology for heart murmur evaluation. - Refer to dermatology for chronic rash evaluation.

## 2023-12-05 NOTE — Progress Notes (Signed)
 Assessment/Plan:   Heart Murmur A heart murmur was detected during the physical exam. Keith [name] has a history of cardiovascular issues, including an angioplasty in his mid-fifties. Given the new finding and his cardiovascular history, a comprehensive evaluation by a cardiologist is recommended. - Refer to cardiology for evaluation of heart murmur and comprehensive cardiovascular assessment  Thoracic Aortic Aneurysm Keith [name] has a thoracic aortic aneurysm measuring 4.3 cm. Dr. Laneta Simmers recommended against surgery due to high risk at his age (89 years) and stable aneurysm size. - No further imaging or follow-up required for thoracic aortic aneurysm as per Dr. Sharee Pimple recommendation  Abdominal Aortic Aneurysm Keith [name] has an abdominal aortic aneurysm measuring 3.6 cm. Continued surveillance is recommended due to its size and potential risk of growth. - Schedule follow-up with Dr. Myra Gianotti for abdominal aortic aneurysm management in one year  Abnormal ABIs Screening for peripheral vascular disease was performed at home nursing visit and were abnormal. Keith [name] does not report claudication symptoms. He is on aspirin and atorvastatin, and his blood pressure is well-controlled at 124/72 mmHg. No additional interventions are necessary at this time. - Continue aspirin therapy - Monitor blood pressure regularly - Follow up with Vascular Surgery, Dr. Myra Gianotti, if any concerns arise  Chronic Rash Keith [name] has a chronic rash on his back, unresponsive to triamcinolone ointment. A dermatology referral is warranted to rule out skin cancer. - Refer to dermatology for evaluation of chronic rash and potential skin cancer - Consider biopsy if recommended by dermatologist  Hyperlipidemia Keith [name] is on atorvastatin for cholesterol management. Recent lab results (November 2024) show LDL at 71, total cholesterol at 139, and HDL at 42. No changes in medication are necessary. - Continue current statin therapy (atorvastatin) -  Monitor cholesterol levels annually  General Health Maintenance Recent lab work (November 2024) shows stable metabolic panel, kidney function, electrolytes, liver function, and no anemia. Hemoglobin A1c is 5.8, indicating prediabetes. - Continue current supplements (vitamin D, magnesium, zinc, fish oil) - Monitor hemoglobin A1c annually  Follow-up - Schedule follow-up with Dr. Myra Gianotti in one year for abdominal aortic aneurysm - Refer to cardiology for heart murmur evaluation - Refer to dermatology for chronic rash evaluation.         Medications Discontinued During This Encounter  Medication Reason   tadalafil (CIALIS) 20 MG tablet     Return in about 6 months (around 06/03/2024) for HLD, fasting labs.    Subjective:   Encounter date: 12/05/2023  Keith [name] is a 89 y.o. male who has Labile hypertension; Hyperlipidemia, mixed; Gastroesophageal reflux disease; Vitamin D deficiency; Elevated PSA; Prediabetes; Colon cancer (HCC); Non-seasonal allergic rhinitis; ASHD (arteriosclerotic heart disease); History of rectal cancer; Aortic atherosclerosis (HCC) by Abd CTscan 2019; Thoracic aortic aneurysm (HCC); Chronic vasomotor rhinitis; and Chronic idiopathic constipation on their problem list..   He  has a past medical history of Cancer (HCC), Elevated PSA, GERD (gastroesophageal reflux disease), Hemorrhoids, Hyperlipidemia, Hypertension, Pneumonia, and Vitamin D deficiency.Marland Kitchen   He presents with chief complaint of Establish Care (-med review and cholesterol concerns.) .   Discussed the use of AI scribe software for clinical note transcription with the patient, who gave verbal consent to proceed.  History of Present Illness   Keith [name] is an 89 year old male who presents for medication review and follow-up on cholesterol management. He is accompanied by Britta Mccreedy, his partner.  He is on atorvastatin for cholesterol management, with no recent changes in his medication regimen. He  undergoes blood work Veterinary surgeon, with  the last lab work in November 2024 showing stable cholesterol levels and a hemoglobin A1c of 5.8, indicating prediabetes.  He has a history of thoracic and abdominal aortic aneurysms. The thoracic aortic aneurysm measures 4.3 cm, and the abdominal aortic aneurysm measures 3.6 cm. He was last evaluated by a cardiothoracic surgeon last year, who noted no significant changes in the aneurysms over the past three to four years. Due to his age, surgery was deemed a significant undertaking, and regular monitoring was not considered necessary. He has not followed up with the vascular surgeon for the abdominal aneurysm but plans to do so in the future.  He underwent an angioplasty in his mid-fifties and followed up with a cardiologist for several years. However, he has not seen a cardiologist in over a decade as he was told there was no need for further follow-up at that time. Recently, a nurse placed a heart monitor on him for two weeks, but he has not received the results yet. No chest pain or shortness of breath is reported.  He uses triamcinolone ointment 0.1% for rashes on his back, which he feels is not very effective. He also uses ipratropium nasal spray and saline spray for chronic nasal drainage, which he describes as not very bothersome.  He takes vitamin D, magnesium, zinc, and fish oil as supplements. He started taking a supplement called Blood Flow 7 about two years ago to help with ankle swelling, which he feels has been beneficial. He walks 35 to 40 minutes daily.       Review of Systems  Constitutional:  Negative for chills, diaphoresis, fever, malaise/fatigue and weight loss.  HENT:  Negative for congestion, ear discharge, ear pain and hearing loss.   Eyes:  Negative for blurred vision, double vision, photophobia, pain, discharge and redness.  Respiratory:  Negative for cough, sputum production, shortness of breath and wheezing.   Cardiovascular:   Negative for chest pain, palpitations, leg swelling and PND.  Gastrointestinal:  Negative for abdominal pain, blood in stool, constipation, diarrhea, heartburn, melena, nausea and vomiting.  Genitourinary:  Negative for dysuria, flank pain, frequency, hematuria and urgency.  Musculoskeletal:  Negative for myalgias.  Skin:  Positive for rash. Negative for itching.  Neurological:  Negative for dizziness, tingling, tremors, speech change, seizures, loss of consciousness, weakness and headaches.  Endo/Heme/Allergies:  Negative for polydipsia.  Psychiatric/Behavioral:  Negative for depression, hallucinations, memory loss, substance abuse and suicidal ideas. The patient does not have insomnia.   All other systems reviewed and are negative.   Past Surgical History:  Procedure Laterality Date   CATARACT EXTRACTION, BILATERAL Bilateral 2021   Dr. Dione Booze   EUS N/A 06/29/2015   Procedure: LOWER ENDOSCOPIC ULTRASOUND (EUS);  Surgeon: Willis Modena, MD;  Location: [facility];  Service: Endoscopy;  Laterality: N/A;   HERNIA REPAIR     left inguinal   XI ROBOTIC ASSISTED LOWER ANTERIOR RESECTION N/A 08/02/2015   Procedure: XI ROBOTIC ASSISTED LOWER ANTERIOR RESECTION;  Surgeon: Almond Lint, MD;  Location: [facility];  Service: General;  Laterality: N/A;    Outpatient Medications Prior to Visit  Medication Sig Dispense Refill   aspirin 81 MG tablet Take 81 mg by mouth every morning.      atorvastatin (LIPITOR) 20 MG tablet Take  1 tablet  Daily  for Cholesterol                                                          /  TAKE BY                                MOUTH                                   ONCE DAILY 90 tablet 3   Cholecalciferol (VITAMIN D PO) Take 5,000 Int'l Units by mouth every morning.      ipratropium (ATROVENT) 0.06 % nasal spray USE 1 TO 2 SPRAY(S) IN EACH NOSTRIL TWICE DAILY TO THREE TIMES DAILY AS NEEDED 45 mL 3   Magnesium 250 MG TABS Take 1 tablet by  mouth daily.     Omega-3 Fatty Acids (FISH OIL PO) Take 1 tablet by mouth every morning.      triamcinolone ointment (KENALOG) 0.1 % Apply 1 Application topically 2 (two) times daily. 80 g 3   tadalafil (CIALIS) 20 MG tablet Take 1/2 to 1 tablet every 2 to 3 days as needed for XXXX 30 tablet 3   No facility-administered medications prior to visit.    Family History  Problem Relation Age of Onset   Heart disease Mother    Heart disease Father     Social History   Socioeconomic History   Marital status: Married    Spouse name: Not on file   Number of children: Not on file   Years of education: Not on file   Highest education level: Not on file  Occupational History   Not on file  Tobacco Use   Smoking status: Former    Current packs/day: 0.00    Types: Cigarettes    Quit date: 10/04/1965    Years since quitting: 58.2    Passive exposure: Never   Smokeless tobacco: Never  Vaping Use   Vaping status: Never Used  Substance and Sexual Activity   Alcohol use: Yes    Alcohol/week: 0.0 standard drinks of alcohol    Comment: rarely   Drug use: No   Sexual activity: Not on file  Other Topics Concern   Not on file  Social History Narrative   Married, wife Britta Mccreedy   Retired   Social Drivers of Corporate investment banker Strain: Not on [employer] Insecurity: Not on file  Transportation Needs: Not on file  Physical Activity: Not on file  Stress: Not on file  Social Connections: Not on file  Intimate Partner Violence: Not on file                                                                                                  Objective:  Physical Exam: BP 124/72   Pulse 70   Temp 97.6 F (36.4 C) (Temporal)   Ht 5\' 10"  (1.778 m)   Wt 162 lb (73.5 kg)   SpO2 99%   BMI 23.24 kg/m     Physical Exam Constitutional:      Appearance: Normal appearance.  HENT:     Head: Normocephalic and atraumatic.  Right Ear: Hearing normal.     Left Ear: Hearing normal.      Nose: Nose normal.  Eyes:     General: No scleral icterus.       Right eye: No discharge.        Left eye: No discharge.     Extraocular Movements: Extraocular movements intact.  Cardiovascular:     Rate and Rhythm: Normal rate and regular rhythm.     Heart sounds: Murmur heard.  Pulmonary:     Effort: Pulmonary effort is normal.     Breath sounds: Normal breath sounds.  Abdominal:     Palpations: Abdomen is soft.     Tenderness: There is no abdominal tenderness.  Skin:    General: Skin is warm.     Findings: Lesion and rash present. Rash is crusting and scaling.     Comments: Multiple skin darkened lesions along with patches of scaling across back  Neurological:     General: No focal deficit present.     Mental Status: He is alert.     Cranial Nerves: No cranial nerve deficit.  Psychiatric:        Mood and Affect: Mood normal.        Behavior: Behavior normal.        Thought Content: Thought content normal.        Judgment: Judgment normal.     No results found.  No results found for this or any previous visit (from the past 2160 hours).      Garner Nash, MD, MS

## 2023-12-10 ENCOUNTER — Other Ambulatory Visit: Payer: Self-pay

## 2023-12-10 DIAGNOSIS — Z79899 Other long term (current) drug therapy: Secondary | ICD-10-CM

## 2023-12-10 MED ORDER — IPRATROPIUM BROMIDE 0.06 % NA SOLN: NASAL | 3 refills | Status: DC

## 2023-12-10 NOTE — Telephone Encounter (Signed)
 Received rx refill request for ipratropium (ATROVENT) 0.06 % nasal spray .   LOV: 12/05/2023 NOV: 06/04/2024

## 2024-01-01 ENCOUNTER — Other Ambulatory Visit: Payer: Self-pay

## 2024-01-01 DIAGNOSIS — I739 Peripheral vascular disease, unspecified: Secondary | ICD-10-CM

## 2024-01-01 DIAGNOSIS — I700 Atherosclerosis of aorta: Secondary | ICD-10-CM

## 2024-01-13 ENCOUNTER — Encounter: Payer: Self-pay | Admitting: Surgery

## 2024-01-13 ENCOUNTER — Ambulatory Visit (INDEPENDENT_AMBULATORY_CARE_PROVIDER_SITE_OTHER)
Admission: RE | Admit: 2024-01-13 | Discharge: 2024-01-13 | Disposition: A | Discharge status: Home / Self Care | Payer: Medicare HMO | Source: Ambulatory Visit | Attending: Surgery | Admitting: Surgery

## 2024-01-13 ENCOUNTER — Ambulatory Visit (HOSPITAL_COMMUNITY)
Admission: RE | Admit: 2024-01-13 | Discharge: 2024-01-13 | Disposition: A | Discharge status: Home / Self Care | Payer: Medicare HMO | Source: Ambulatory Visit | Attending: Surgery | Admitting: Surgery

## 2024-01-13 ENCOUNTER — Ambulatory Visit: Payer: Medicare HMO | Admitting: Surgery

## 2024-01-13 VITALS — BP 162/76 | HR 58 | Temp 97.9°F | Ht 70.0 in | Wt 162.0 lb

## 2024-01-13 DIAGNOSIS — I739 Peripheral vascular disease, unspecified: Secondary | ICD-10-CM

## 2024-01-13 DIAGNOSIS — I7143 Infrarenal abdominal aortic aneurysm, without rupture: Secondary | ICD-10-CM | POA: Diagnosis not present

## 2024-01-13 DIAGNOSIS — I700 Atherosclerosis of aorta: Secondary | ICD-10-CM | POA: Insufficient documentation

## 2024-01-13 LAB — VAS US ABI WITH/WO TBI
Left ABI: 1.13
Right ABI: 1.04

## 2024-01-13 NOTE — Progress Notes (Signed)
 [facility]  Patient name: Keith [name] MRN: [id number] DOB: 1931-11-29 Sex: male   REQUESTING PROVIDER:   Dr. Janee Morn   REASON FOR CONSULT:    AAA / PAD  HISTORY OF PRESENT ILLNESS:   Keith [name] is a 89 y.o. male, who is here today for evaluation of PAD and an abdominal aortic aneurysm.  I last saw him in 2021.  At that time his aortic diameter was 3.2 cm.  He also had a ascending aortic aneurysm measuring 4.2 cm.  I had referred him to cardiac surgery.  He is not a operative candidate.  He comes in today for surveillance imaging.  He denies any abdominal pain.  He denies claudication symptoms.   He is a former smoker.  He takes a statin for hypercholesterolemia.  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   Elevated PSA    GERD (gastroesophageal reflux disease)    Hemorrhoids    Hyperlipidemia    Hypertension    Pneumonia    age 88   Vitamin D deficiency      FAMILY HISTORY   Family History  Problem Relation Age of Onset   Heart disease Mother    Heart disease Father     SOCIAL HISTORY:   Social History   Socioeconomic History   Marital status: Married    Spouse name: Not on file   Number of children: Not on file   Years of education: Not on file   Highest education level: Not on file  Occupational History   Not on file  Tobacco Use   Smoking status: Former    Current packs/day: 0.00    Types: Cigarettes    Quit date: 10/04/1965    Years since quitting: 58.3    Passive exposure: Never   Smokeless tobacco: Never  Vaping Use   Vaping status: Never Used  Substance and Sexual Activity   Alcohol use: Yes    Alcohol/week: 0.0 standard drinks of alcohol    Comment: rarely   Drug use: No   Sexual activity: Not on file  Other Topics Concern   Not on file  Social History Narrative   Married,  wife Britta Mccreedy   Retired   Social Drivers of Corporate investment banker Strain: Not on [employer] Insecurity: Not on file  Transportation Needs: Not on file  Physical Activity: Not on file  Stress: Not on file  Social Connections: Not on file  Intimate Partner Violence: Not on file    ALLERGIES:    Allergies  Allergen Reactions   Penicillins          CURRENT MEDICATIONS:    Current Outpatient Medications  Medication Sig Dispense Refill   aspirin 81 MG tablet Take 81 mg by mouth every morning.      atorvastatin (LIPITOR) 20 MG tablet Take  1 tablet  Daily  for Cholesterol                                                          /  TAKE BY                                MOUTH                                   ONCE DAILY 90 tablet 3   Cholecalciferol (VITAMIN D PO) Take 5,000 Int'l Units by mouth every morning.      ipratropium (ATROVENT) 0.06 % nasal spray USE 1 TO 2 SPRAY(S) IN EACH NOSTRIL TWICE DAILY TO THREE TIMES DAILY AS NEEDED 45 mL 3   Magnesium 250 MG TABS Take 1 tablet by mouth daily.     Omega-3 Fatty Acids (FISH OIL PO) Take 1 tablet by mouth every morning.      triamcinolone ointment (KENALOG) 0.1 % Apply 1 Application topically 2 (two) times daily. 80 g 3   No current facility-administered medications for this visit.    REVIEW OF SYSTEMS:   [X]  denotes positive finding, [ ]  denotes negative finding Cardiac  Comments:  Chest pain or chest pressure:    Shortness of breath upon exertion:    Short of breath when lying flat:    Irregular heart rhythm:        Vascular    Pain in calf, thigh, or hip brought on by ambulation:    Pain in feet at night that wakes you up from your sleep:     Blood clot in your veins:    Leg swelling:         Pulmonary    Oxygen at home:    Productive cough:     Wheezing:         Neurologic    Sudden weakness in arms or legs:     Sudden numbness in arms or legs:     Sudden onset of  difficulty speaking or slurred speech:    Temporary loss of vision in one eye:     Problems with dizziness:         Gastrointestinal    Blood in stool:      Vomited blood:         Genitourinary    Burning when urinating:     Blood in urine:        Psychiatric    Major depression:         Hematologic    Bleeding problems:    Problems with blood clotting too easily:        Skin    Rashes or ulcers:        Constitutional    Fever or chills:     PHYSICAL EXAM:   There were no vitals filed for this visit.  GENERAL: The patient is a well-nourished male, in no acute distress. The vital signs are documented above. CARDIAC: There is a regular rate and rhythm.  VASCULAR: Palpable posterior tibial pulses.  No carotid bruits PULMONARY: Nonlabored respirations ABDOMEN: Soft and non-tender with no pulsatile mass MUSCULOSKELETAL: There are no major deformities or cyanosis. NEUROLOGIC: No focal weakness or paresthesias are detected. SKIN: There are no ulcers or rashes noted. PSYCHIATRIC: The patient has a normal affect.  STUDIES:   I have reviewed the following: AAA: Abdominal Aorta: The largest aortic measurement is 3.6 cm. The largest  aortic diameter remains essentially unchanged compared to prior exam.  Previous diameter measurement was 3.6 cm obtained on  01/20/2023 CT angio.   ABI/TBIToday's ABIToday's TBIPrevious ABIPrevious TBI  +-------+-----------+-----------+------------+------------+  Right 1.04       0.86                                 +-------+-----------+-----------+------------+------------+  Left  1.13       0.85                                 +-------+-----------+-----------+------------+------------+      ASSESSMENT and PLAN   AAA: Maximum aortic diameter is 3.6 cm.  This is by both ultrasound today and CT angiogram in 2024.  It has only grown 2 mm in 4 years.  I will plan on repeating his imaging in 2 years.   Charlena Cross, MD,  FACS [facility] [phone] Pager [phone]

## 2024-01-30 ENCOUNTER — Other Ambulatory Visit: Payer: Self-pay | Admitting: Family Medicine

## 2024-01-30 DIAGNOSIS — E782 Mixed hyperlipidemia: Secondary | ICD-10-CM

## 2024-01-30 MED ORDER — ATORVASTATIN CALCIUM 20 MG PO TABS: ORAL_TABLET | ORAL | 3 refills | Status: AC

## 2024-01-30 NOTE — Telephone Encounter (Signed)
 Copied from CRM [id number]. Topic: Clinical - Medication Refill >> Jan 30, 2024  8:47 AM Lisa Rideau A wrote: Most Recent Primary Care Visit:  Provider: Catheryn Cluck  Department: [facility]  Visit Type: NEW PT - OFFICE VISIT  Date: 12/05/2023  Medication: atorvastatin  (LIPITOR) 20 MG tablet  Has the patient contacted their pharmacy? Yes (Agent: If no, request that the patient contact the pharmacy for the refill. If patient does not wish to contact the pharmacy document the reason why and proceed with request.) (Agent: If yes, when and what did the pharmacy advise?) Office had not called prescription in  Is this the correct pharmacy for this prescription? Yes If no, delete pharmacy and type the correct one.  This is the patient's preferred pharmacy:  [facility] [id number] [facility], [place] - [address] [place] [address] Phone: [phone] Fax: [phone]   Has the prescription been filled recently? No  Is the patient out of the medication? Yes  Has the patient been seen for an appointment in the last year OR does the patient have an upcoming appointment? Yes  Can we respond through MyChart? Yes  Agent: Please be advised that Rx refills may take up to 3 business days. We ask that you follow-up with your pharmacy.

## 2024-02-24 ENCOUNTER — Encounter: Payer: Self-pay | Admitting: Cardiology

## 2024-02-24 ENCOUNTER — Ambulatory Visit: Attending: Cardiology | Admitting: Cardiology

## 2024-02-24 VITALS — BP 140/60 | HR 63 | Resp 16 | Ht 70.0 in | Wt 157.6 lb

## 2024-02-24 DIAGNOSIS — I714 Abdominal aortic aneurysm, without rupture, unspecified: Secondary | ICD-10-CM | POA: Diagnosis not present

## 2024-02-24 DIAGNOSIS — I7121 Aneurysm of the ascending aorta, without rupture: Secondary | ICD-10-CM

## 2024-02-24 DIAGNOSIS — I2510 Atherosclerotic heart disease of native coronary artery without angina pectoris: Secondary | ICD-10-CM

## 2024-02-24 DIAGNOSIS — R011 Cardiac murmur, unspecified: Secondary | ICD-10-CM | POA: Diagnosis not present

## 2024-02-24 NOTE — Patient Instructions (Signed)
 Testing/Procedures: Echo  Your physician has requested that you have an echocardiogram. Echocardiography is a painless test that uses sound waves to create images of your heart. It provides your doctor with information about the size and shape of your heart and how well your heart's chambers and valves are working. This procedure takes approximately one hour. There are no restrictions for this procedure. Please do NOT wear cologne, perfume, aftershave, or lotions (deodorant is allowed). Please arrive 15 minutes prior to your appointment time.  Please note: We ask at that you not bring children with you during ultrasound (echo/ vascular) testing. Due to room size and safety concerns, children are not allowed in the ultrasound rooms during exams. Our front office staff cannot provide observation of children in our lobby area while testing is being conducted. An adult accompanying a patient to their appointment will only be allowed in the ultrasound room at the discretion of the ultrasound technician under special circumstances. We apologize for any inconvenience.   Exercise nuclear stress test   Your physician has requested that you have en exercise stress myoview. For further information please visit [email or web address] Please follow instruction sheet, as given.    Follow-Up: At [facility], you and your health needs are our priority.  As part of our continuing mission to provide you with exceptional heart care, our providers are all part of one team.  This team includes your primary Cardiologist (physician) and Advanced Practice Providers or APPs (Physician Assistants and Nurse Practitioners) who all work together to provide you with the care you need, when you need it.  Your next appointment:   1 year(s)  Provider:   Cody Das, MD

## 2024-02-24 NOTE — Progress Notes (Signed)
 Cardiology Office Note:  .   Date:  02/24/2024  ID:  Keith [name], DOB 1932/06/21, MRN [id number] PCP: Catheryn Cluck, MD  [facility] Providers Cardiologist:  Fransico Ivy, MD PCP: Catheryn Cluck, MD  Chief Complaint  Patient presents with   Heart Murmur   New Patient (Initial Visit)     Keith [name] is a 89 y.o. male with aortic and coronary atherosclerosis, TAA, AAA  Discussed the use of AI scribe software for clinical note transcription with the patient, who gave verbal consent to proceed.  History of Present Illness Keith [name] is an 89 year old male with aortic enlargement who presents for follow-up of his cardiovascular health. He was referred by Dr. Charlotte Cookey for follow-up of his aortic enlargement.  He has an abdominal aortic aneurysm and ascending aorta enlargement, identified on a CT scan, which has been monitored for two years. His last evaluation was six months ago.  He experienced a mechanical fall recently but did not lose consciousness. He has had a couple of falls over the last few years, which have been discussed with his primary care physician.  He regularly monitors his blood pressure at the [facility], with typical readings in the 130s to 140s, though it was 124/72 in February. Today's reading was higher than usual, but he generally maintains readings in the 120s to 130s.  He experienced chest pain six months ago, attributed to indigestion, and sought emergency care with no significant findings. No chest pain since then.  He lives with his wife in a townhouse near [place] and remains active by walking 30 to 40 minutes most days. No chest pain or shortness of breath during his walks.      Vitals:   02/24/24 1057 02/24/24 1103  BP: (!) 162/78 (!) 150/80  Pulse: 63   Resp: 16   SpO2: 97%       Review of Systems  Cardiovascular:  Negative for chest pain, dyspnea on exertion, leg swelling, palpitations and syncope.         Studies Reviewed: Keith [name]        EKG 02/24/2024: Sinus bradycardia Nonspecific ST abnormality When compared with ECG of 20-Jan-2023 16:46, Premature atrial complexes are no longer Present  Independently interpreted 08/2023: Chol 139, TG 179, HDL 42, LDL 71 HbA1C 5.8% Hb 14.4 Cr 0.7, T.bili 1.3 TSH 1.9  Aorta duplex 01/2024: Abdominal Aorta: The largest aortic measurement is 3.6 cm. The largest  aortic diameter remains essentially unchanged compared to prior exam.  Previous diameter measurement was 3.6 cm obtained on 01/20/2023 CT angio.     ABI 01/2024: Right: Resting right ankle-brachial index is within normal range. The  right toe-brachial index is normal.  Left: Resting left ankle-brachial index is within normal range. The left  toe-brachial index is normal.   CTA chest 01/2023: 1. Stable ascending thoracic aorta aneurysm (4.3 cm). Recommend annual imaging followup by CTA or MRA. This recommendation follows 2010 ACCF/AHA/AATS/ACR/ASA/SCA/SCAI/SIR/STS/SVM Guidelines for the Diagnosis and Management of Patients with Thoracic Aortic Disease. Circulation. 2010; 121: Q657-Q469. Aortic aneurysm NOS (ICD10-I71.9) 2. Stable infrarenal abdominal aorta aneurysm (3.6 cm). Recommend follow-up ultrasound every 2 years. This recommendation follows ACR consensus guidelines: White Paper of the ACR Incidental Findings Committee II on Vascular Findings. J Am Coll Radiol 2013; 62:952-841. 3. Aortic Atherosclerosis (ICD10-I70.0) including at least 3 vessel coronary calcification. 4. No pulmonary embolus. 5. Colonic diverticulosis with no acute diverticulitis. 6. Stool throughout the colon-correlate for constipation. 7. Prostatomegaly.  Physical Exam Vitals and nursing note reviewed.  Constitutional:      General: He is not in acute distress. HENT:     Head:     Comments: Bruise on left upper forehead Neck:     Vascular: No JVD.  Cardiovascular:     Rate and Rhythm: Normal rate  and regular rhythm.     Heart sounds: Murmur heard.     High-pitched blowing holosystolic murmur is present with a grade of 2/6 at the apex.     High-pitched blowing decrescendo early diastolic murmur is present with a grade of 2/4 at the upper right sternal border radiating to the apex.  Pulmonary:     Effort: Pulmonary effort is normal.     Breath sounds: Normal breath sounds. No wheezing or rales.  Musculoskeletal:     Right lower leg: No edema.     Left lower leg: No edema.      VISIT DIAGNOSES:   ICD-10-CM   1. Murmur  R01.1 EKG 12-Lead    ECHOCARDIOGRAM COMPLETE    2. Coronary artery disease involving native coronary artery of native heart without angina pectoris  I25.10 Myocardial Perfusion Imaging    Cardiac Stress Test: Informed Consent Details: Physician/Practitioner Attestation; Transcribe to consent form and obtain patient signature    3. Aneurysm of ascending aorta without rupture (HCC)  I71.21     4. Abdominal aortic aneurysm (AAA) without rupture, unspecified part (HCC)  I71.40        Keith [name] is a 89 y.o. male with aortic and coronary atherosclerosis, TAA, AAA Assessment & Plan  Murmur: Diastolic as well as systolic murmur, suspect AI and MR. Check echocardiogram.  Coronary and aortic atherosclerosis: Previously coronary calcification noted on CT chest in the past.  Episode of chest pain 6 months ago, with no recurrence. Recommend exercise nuclear stress test for risk stratification. Continue aspirin, statin.  Lipids very well-controlled.  TAA: Seen by Dr. Sherene Dilling in the past, 4.3 cm TAA.  Conservative management recommended given moderate size and advanced age.  Avoid heavy lifting, Valsalva maneuver  AAA: Monitored by Dr. Charlotte Cookey.  Elevated blood pressure without diagnosis of hypertension: Blood pressure generally very well-controlled.  Elevated today, recommend regular home blood pressure checks.  She has follow-up with PCP Dr. Hildy Lowers in  August.  No medication added today.    Informed Consent   Shared Decision Making/Informed Consent The risks [chest pain, shortness of breath, cardiac arrhythmias, dizziness, blood pressure fluctuations, myocardial infarction, stroke/transient ischemic attack, nausea, vomiting, allergic reaction, radiation exposure, metallic taste sensation and life-threatening complications (estimated to be 1 in 10,000)], benefits (risk stratification, diagnosing coronary artery disease, treatment guidance) and alternatives of a nuclear stress test were discussed in detail with Keith [name] and he agrees to proceed.       F/u in 1 year  Signed, Cody Das, MD

## 2024-03-13 ENCOUNTER — Encounter (HOSPITAL_COMMUNITY): Payer: Self-pay | Admitting: *Deleted

## 2024-03-13 ENCOUNTER — Telehealth (HOSPITAL_COMMUNITY): Payer: Self-pay | Admitting: *Deleted

## 2024-03-13 NOTE — Telephone Encounter (Signed)
 Instructions for stress test on 04/02/24 sent via [employer].

## 2024-03-16 ENCOUNTER — Telehealth: Payer: Self-pay | Admitting: Cardiology

## 2024-03-16 NOTE — Telephone Encounter (Signed)
  Pt said, he decided to hold off his two tests echo and stress tests for now. He said, he will callback once his ready to get it. He said, he will try to get it done before he sees him next year

## 2024-03-16 NOTE — Telephone Encounter (Signed)
 Noted.  Thanks MJP

## 2024-03-23 ENCOUNTER — Telehealth (HOSPITAL_COMMUNITY): Payer: Self-pay | Admitting: Cardiology

## 2024-03-23 NOTE — Telephone Encounter (Signed)
 Patient cancelled Myoview for reason below:  03/16/24-Patient (Pt doesn't want to do it right now, he is feeling good, will call to schedule if it will change.) Ronnell Coins   Order will be removed from the [facility]. Thank you.

## 2024-03-23 NOTE — Telephone Encounter (Signed)
 fyi

## 2024-04-02 ENCOUNTER — Other Ambulatory Visit (HOSPITAL_COMMUNITY)

## 2024-04-02 ENCOUNTER — Inpatient Hospital Stay (HOSPITAL_COMMUNITY): Admission: RE | Admit: 2024-04-02 | Source: Ambulatory Visit

## 2024-05-06 ENCOUNTER — Other Ambulatory Visit: Payer: Self-pay

## 2024-05-06 ENCOUNTER — Telehealth: Payer: Self-pay

## 2024-05-06 DIAGNOSIS — E559 Vitamin D deficiency, unspecified: Secondary | ICD-10-CM

## 2024-05-06 DIAGNOSIS — Z13228 Encounter for screening for other metabolic disorders: Secondary | ICD-10-CM

## 2024-05-06 DIAGNOSIS — R7303 Prediabetes: Secondary | ICD-10-CM

## 2024-05-06 DIAGNOSIS — R945 Abnormal results of liver function studies: Secondary | ICD-10-CM

## 2024-05-06 DIAGNOSIS — Z130 Encounter for screening for diseases of the blood and blood-forming organs and certain disorders involving the immune mechanism: Secondary | ICD-10-CM

## 2024-05-06 DIAGNOSIS — E782 Mixed hyperlipidemia: Secondary | ICD-10-CM

## 2024-05-06 NOTE — Telephone Encounter (Signed)
 Lab orders were placed and patient was called and notified.  Pt was informed to not have anything to eat or drink 6-8 hours prior to lab appointment. Pt verbalized understanding and lab appointment was scheduled.

## 2024-05-29 ENCOUNTER — Ambulatory Visit (INDEPENDENT_AMBULATORY_CARE_PROVIDER_SITE_OTHER)

## 2024-05-29 DIAGNOSIS — Z0000 Encounter for general adult medical examination without abnormal findings: Secondary | ICD-10-CM | POA: Diagnosis not present

## 2024-05-29 NOTE — Patient Instructions (Signed)
 Mr. Monette , Thank you for taking time out of your busy schedule to complete your Annual Wellness Visit with me. I enjoyed our conversation and look forward to speaking with you again next year. I, as well as your care team,  appreciate your ongoing commitment to your health goals. Please review the following plan we discussed and let me know if I can assist you in the future. Your Game plan/ To Do List    Referrals: If you haven't heard from the office you've been referred to, please reach out to them at the phone provided.   Follow up Visits: We will see or speak with you next year for your Next Medicare AWV with our clinical staff Have you seen your provider in the last 6 months (3 months if uncontrolled diabetes)? Yes  Clinician Recommendations:  Aim for 30 minutes of exercise or brisk walking, 6-8 glasses of water, and 5 servings of fruits and vegetables each day.       This is a list of the screenings recommended for you:  Health Maintenance  Topic Date Due   Zoster (Shingles) Vaccine (1 of 2) 05/05/1951   COVID-19 Vaccine (7 - 2024-25 season) 09/03/2023   Flu Shot  05/08/2024   Medicare Annual Wellness Visit  05/29/2025   DTaP/Tdap/Td vaccine (3 - Tdap) 07/01/2030   Pneumococcal Vaccine for age over 18  Completed   HPV Vaccine  Aged Out   Meningitis B Vaccine  Aged Out    Advanced directives: (In Chart) A copy of your advanced directives are scanned into your chart should your provider ever need it. Advance Care Planning is important because it:  [x]  Makes sure you receive the medical care that is consistent with your values, goals, and preferences  [x]  It provides guidance to your family and loved ones and reduces their decisional burden about whether or not they are making the right decisions based on your wishes.  Follow the link provided in your after visit summary or read over the paperwork we have mailed to you to help you started getting your Advance Directives in  place. If you need assistance in completing these, please reach out to us  so that we can help you!  See attachments for Preventive Care and Fall Prevention Tips.

## 2024-05-29 NOTE — Progress Notes (Signed)
 Subjective:   Keith [name] is a 89 y.o. who presents for a Medicare Wellness preventive visit.  As a reminder, Annual Wellness Visits don't include a physical exam, and some assessments may be limited, especially if this visit is performed virtually. We may recommend an in-person follow-up visit with your provider if needed.  Visit Complete: Virtual I connected with  Keith [name] on 05/29/24 by a audio enabled telemedicine application and verified that I am speaking with the correct person using two identifiers.  Patient Location: Home  Provider Location: Office/Clinic  I discussed the limitations of evaluation and management by telemedicine. The patient expressed understanding and agreed to proceed.  Vital Signs: Because this visit was a virtual/telehealth visit, some criteria may be missing or patient reported. Any vitals not documented were not able to be obtained and vitals that have been documented are patient reported.  VideoError- Librarian, academic were attempted between this provider and patient, however failed, due to patient having technical difficulties OR patient did not have access to video capability.  We continued and completed visit with audio only.   Persons Participating in Visit: Patient.  AWV Questionnaire: No: Patient Medicare AWV questionnaire was not completed prior to this visit.  Cardiac Risk Factors include: advanced age (>50men, >16 women);dyslipidemia;hypertension;male gender     Objective:    Today's Vitals   There is no height or weight on file to calculate BMI.     05/29/2024    3:43 PM 02/11/2023    4:37 PM 01/20/2023    6:48 PM 01/20/2023    4:38 PM 02/08/2022    3:05 PM 01/23/2021   11:22 AM 10/12/2020   11:42 AM  Advanced Directives  Does Patient Have a Medical Advance Directive? Yes Yes No Yes Yes Yes Yes  Type of Estate agent of [place];Living will Living will  Living will;Healthcare  Power of [employer] Power of [place];Living will Healthcare Power of [place];Living will  Does patient want to make changes to medical advance directive?      No - Patient declined No - Patient declined  Copy of Healthcare Power of Attorney in Chart? Yes - validated most recent copy scanned in chart (See row information)     No - copy requested Yes - validated most recent copy scanned in chart (See row information)    Current Medications (verified) Outpatient Encounter Medications as of 05/29/2024  Medication Sig   aspirin 81 MG tablet Take 81 mg by mouth every morning.    atorvastatin  (LIPITOR) 20 MG tablet Take  1 tablet  Daily  for Cholesterol                                                          /                                 TAKE BY                                MOUTH  ONCE DAILY   Cholecalciferol (VITAMIN D  PO) Take 5,000 Int'l Units by mouth every morning.    ipratropium (ATROVENT ) 0.06 % nasal spray USE 1 TO 2 SPRAY(S) IN EACH NOSTRIL TWICE DAILY TO THREE TIMES DAILY AS NEEDED   Magnesium 250 MG TABS Take 1 tablet by mouth daily.   Omega-3 Fatty Acids (FISH OIL PO) Take 1 tablet by mouth every morning.    triamcinolone  ointment (KENALOG ) 0.1 % Apply 1 Application topically 2 (two) times daily.   Zinc 50 MG TABS Take 1 tablet by mouth daily.   No facility-administered encounter medications on file as of 05/29/2024.    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 Octavia   EUS N/A 06/29/2015   Procedure: LOWER ENDOSCOPIC ULTRASOUND (EUS);  Surgeon: Elsie Cree, MD;   Location: [facility];  Service: Endoscopy;  Laterality: N/A;   HERNIA REPAIR     left inguinal   XI ROBOTIC ASSISTED LOWER ANTERIOR RESECTION N/A 08/02/2015   Procedure: XI ROBOTIC ASSISTED LOWER ANTERIOR RESECTION;  Surgeon: Jina Nephew, MD;  Location: [facility];  Service: General;  Laterality: N/A;   Family History  Problem Relation Age of Onset   Heart disease Mother    Heart disease Father    Social History   Socioeconomic History   Marital status: Married    Spouse name: Not on file   Number of children: Not on file   Years of education: Not on file   Highest education level: Not on file  Occupational History   Not on file  Tobacco Use   Smoking status: Former    Current packs/day: 0.00    Types: Cigarettes    Quit date: 10/04/1965    Years since quitting: 58.6    Passive exposure: Never   Smokeless tobacco: Never  Vaping Use   Vaping status: Never Used  Substance and Sexual Activity   Alcohol use: Not Currently    Comment: rarely   Drug use: No   Sexual activity: Not on file  Other Topics Concern   Not on file  Social History Narrative   Married, wife Heron   Retired   Social Drivers of Health   Financial Resource Strain: Low Risk  (05/29/2024)   Overall Financial Resource Strain (CARDIA)    Difficulty of Paying Living Expenses: Not hard at all  Food Insecurity: No Food Insecurity (05/29/2024)   Hunger Vital Sign    Worried About Running Out of Food in the Last Year: Never true    Ran Out of Food in the Last Year: Never true  Transportation Needs: No Transportation Needs (05/29/2024)   PRAPARE - Administrator, Civil Service (Medical): No    Lack of Transportation (Non-Medical): No  Physical Activity: Sufficiently Active (05/29/2024)   Exercise Vital Sign    Days of Exercise per Week: 6 days    Minutes of Exercise per Session: 40 min  Stress: No Stress Concern Present (05/29/2024)   [employer] of Occupational Health - Occupational  Stress Questionnaire    Feeling of Stress: Not at  all  Social Connections: Unknown (05/29/2024)   Social Connection and Isolation Panel    Frequency of Communication with Friends and Family: Twice a week    Frequency of Social Gatherings with Friends and Family: Not on file    Attends Religious Services: More than 4 times per year    Active Member of [employer] or Organizations: No    Attends Banker Meetings: Never    Marital Status: Married    Tobacco Counseling Counseling given: Not Answered    Clinical Intake:  Pre-visit preparation completed: Yes  Pain : No/denies pain     Nutritional Risks: None Diabetes: No  Lab Results  Component Value Date   HGBA1C 5.8 (H) 08/21/2023   HGBA1C 5.8 (H) 05/21/2023   HGBA1C 5.9 (H) 02/11/2023     How often do you need to have someone help you when you read instructions, pamphlets, or other written materials from your doctor or pharmacy?: 1 - Never  Interpreter Needed?: No  Information entered by :: NAllen LPN   Activities of Daily Living     05/29/2024    3:36 PM  In your present state of health, do you have any difficulty performing the following activities:  Hearing? 0  Vision? 0  Difficulty concentrating or making decisions? 0  Walking or climbing stairs? 0  Dressing or bathing? 0  Doing errands, shopping? 0  Preparing Food and eating ? N  Using the Toilet? N  In the past six months, have you accidently leaked urine? Y  Do you have problems with loss of bowel control? N  Managing your Medications? N  Managing your Finances? N  Housekeeping or managing your Housekeeping? N    Patient Care Team: Sebastian Beverley NOVAK, MD as PCP - General (Family Medicine) Elmira Newman PARAS, MD as PCP - Cardiology (Cardiology) Kenn Dunnings, OD (Optometry) Matilda Senior, MD as Consulting Physician (Urology) Arvell Norleen LABOR, MD as Referring Physician (Orthopedic Surgery) Steva Devere CROME, RN as Registered Nurse  (Medical Oncology) Cloretta Arley NOVAK, MD as Consulting Physician (Oncology) Burnette Fallow, MD as Consulting Physician (Gastroenterology) Serene Gaile ORN, MD as Consulting Physician (Vascular Surgery) Lucas Dorise POUR, MD as Consulting Physician (Cardiothoracic Surgery)  I have updated your Care Teams any recent Medical Services you may have received from other providers in the past year.     Assessment:   This is a routine wellness examination for Keith [name].  Hearing/Vision screen Hearing Screening - Comments:: Denies hearing issues Vision Screening - Comments:: Regular eye exams, [facility]   Goals Addressed             This Visit's Progress    Patient Stated       05/29/2024, denies goals       Depression Screen     05/29/2024    3:45 PM 12/05/2023    2:58 PM 02/11/2023    4:41 PM 05/13/2022    9:21 PM 02/08/2022    3:05 PM 11/02/2021   12:18 AM 01/23/2021   11:25 AM  PHQ 2/9 Scores  PHQ - 2 Score 0 0 0 0 1 0 0  PHQ- 9 Score 0 0         Fall Risk     05/29/2024    3:44 PM 12/05/2023    2:57 PM 02/11/2023    4:41 PM 05/13/2022    9:20 PM 02/08/2022    3:05 PM  Fall Risk   Falls in the past year? 1 1 0 0  0  Comment tripped      Number falls in past yr: 0 0   1  Injury with Fall? 0 0   0  Risk for fall due to : Medication side effect No Fall Risks  No Fall Risks   Follow up Falls prevention discussed;Falls evaluation completed Falls evaluation completed  Falls evaluation completed;Falls prevention discussed;Education provided       Data saved with a previous flowsheet row definition    MEDICARE RISK AT HOME:  Medicare Risk at Home Any stairs in or around the home?: No If so, are there any without handrails?: No Home free of loose throw rugs in walkways, pet beds, electrical cords, etc?: Yes Adequate lighting in your home to reduce risk of falls?: Yes Life alert?: No Use of a cane, walker or w/c?: No Grab bars in the bathroom?: No Shower chair or bench in shower?:  No Elevated toilet seat or a handicapped toilet?: No  TIMED UP AND GO:  Was the test performed?  No  Cognitive Function: 6CIT completed        05/29/2024    3:46 PM  6CIT Screen  What Year? 0 points  What month? 0 points  What time? 0 points  Count back from 20 0 points  Months in reverse 0 points  Repeat phrase 2 points  Total Score 2 points    Immunizations Immunization History  Administered Date(s) Administered   Fluad Quad(high Dose 65+) 06/09/2019, 07/08/2023   Influenza Split 07/29/2013, 06/22/2015   Influenza, High Dose Seasonal PF 06/15/2014, 06/12/2016, 07/04/2022   Influenza-Unspecified 06/04/2017, 06/10/2018, 06/17/2020   Moderna Sars-Covid-2 Vaccination 06/14/2021   PFIZER Comirnaty(Gray Top)Covid-19 Tri-Sucrose Vaccine 07/04/2022   PFIZER(Purple Top)SARS-COV-2 Vaccination 10/27/2019, 11/18/2019, 07/08/2020, 07/09/2023   Pneumococcal Conjugate-13 06/15/2014   Pneumococcal Polysaccharide-23 03/12/2016   Td 02/09/2010, 07/01/2020   Zoster, Live 10/08/2000    Screening Tests Health Maintenance  Topic Date Due   Zoster Vaccines- Shingrix (1 of 2) [birth year]05/1951   COVID-19 Vaccine (7 - 2024-25 season) 09/03/2023   INFLUENZA VACCINE  05/08/2024   Medicare Annual Wellness (AWV)  05/29/2025   DTaP/Tdap/Td (3 - Tdap) 07/01/2030   Pneumococcal Vaccine: 50+ Years  Completed   HPV VACCINES  Aged Out   Meningococcal B Vaccine  Aged Out    Health Maintenance  Health Maintenance Due  Topic Date Due   Zoster Vaccines- Shingrix (1 of 2) 05/05/1951   COVID-19 Vaccine (7 - 2024-25 season) 09/03/2023   INFLUENZA VACCINE  05/08/2024   Health Maintenance Items Addressed: Due for flu and covid vaccine.  Additional Screening:  Vision Screening: Recommended annual ophthalmology exams for early detection of glaucoma and other disorders of the eye. Would you like a referral to an eye doctor? No    Dental Screening: Recommended annual dental exams for proper oral  hygiene  Community Resource Referral / Chronic Care Management: CRR required this visit?  No   CCM required this visit?  No   Plan:    I have personally reviewed and noted the following in the patient's chart:   Medical and social history Use of alcohol, tobacco or illicit drugs  Current medications and supplements including opioid prescriptions. Patient is not currently taking opioid prescriptions. Functional ability and status Nutritional status Physical activity Advanced directives List of other physicians Hospitalizations, surgeries, and ER visits in previous 12 months Vitals Screenings to include cognitive, depression, and falls Referrals and appointments  In addition, I have reviewed and discussed with patient certain preventive protocols,  quality metrics, and best practice recommendations. A written personalized care plan for preventive services as well as general preventive health recommendations were provided to patient.   Ardella FORBES Dawn, LPN   [id number]   After Visit Summary: (MyChart) Due to this being a telephonic visit, the after visit summary with patients personalized plan was offered to patient via MyChart   Notes: Nothing significant to report at this time.

## 2024-06-04 ENCOUNTER — Other Ambulatory Visit (INDEPENDENT_AMBULATORY_CARE_PROVIDER_SITE_OTHER)

## 2024-06-04 ENCOUNTER — Encounter: Payer: Self-pay | Admitting: Family Medicine

## 2024-06-04 ENCOUNTER — Ambulatory Visit (INDEPENDENT_AMBULATORY_CARE_PROVIDER_SITE_OTHER): Payer: Medicare HMO | Admitting: Family Medicine

## 2024-06-04 ENCOUNTER — Ambulatory Visit: Payer: Self-pay | Admitting: Family Medicine

## 2024-06-04 VITALS — BP 147/67 | HR 57 | Temp 97.1°F | Resp 18 | Wt 161.8 lb

## 2024-06-04 DIAGNOSIS — R351 Nocturia: Secondary | ICD-10-CM | POA: Diagnosis not present

## 2024-06-04 DIAGNOSIS — Z13228 Encounter for screening for other metabolic disorders: Secondary | ICD-10-CM | POA: Diagnosis not present

## 2024-06-04 DIAGNOSIS — R0989 Other specified symptoms and signs involving the circulatory and respiratory systems: Secondary | ICD-10-CM | POA: Diagnosis not present

## 2024-06-04 DIAGNOSIS — I714 Abdominal aortic aneurysm, without rupture, unspecified: Secondary | ICD-10-CM

## 2024-06-04 DIAGNOSIS — I2510 Atherosclerotic heart disease of native coronary artery without angina pectoris: Secondary | ICD-10-CM | POA: Diagnosis not present

## 2024-06-04 DIAGNOSIS — E559 Vitamin D deficiency, unspecified: Secondary | ICD-10-CM

## 2024-06-04 DIAGNOSIS — I700 Atherosclerosis of aorta: Secondary | ICD-10-CM

## 2024-06-04 DIAGNOSIS — R945 Abnormal results of liver function studies: Secondary | ICD-10-CM

## 2024-06-04 DIAGNOSIS — I7121 Aneurysm of the ascending aorta, without rupture: Secondary | ICD-10-CM | POA: Diagnosis not present

## 2024-06-04 DIAGNOSIS — R7303 Prediabetes: Secondary | ICD-10-CM

## 2024-06-04 DIAGNOSIS — Z130 Encounter for screening for diseases of the blood and blood-forming organs and certain disorders involving the immune mechanism: Secondary | ICD-10-CM | POA: Diagnosis not present

## 2024-06-04 DIAGNOSIS — N401 Enlarged prostate with lower urinary tract symptoms: Secondary | ICD-10-CM | POA: Insufficient documentation

## 2024-06-04 DIAGNOSIS — E782 Mixed hyperlipidemia: Secondary | ICD-10-CM

## 2024-06-04 LAB — CBC WITH DIFFERENTIAL/PLATELET
Basophils Absolute: 0 K/uL (ref 0.0–0.1)
Basophils Relative: 0.5 % (ref 0.0–3.0)
Eosinophils Absolute: 0.3 K/uL (ref 0.0–0.7)
Eosinophils Relative: 5.7 % — ABNORMAL HIGH (ref 0.0–5.0)
HCT: 42.6 % (ref 39.0–52.0)
Hemoglobin: 14 g/dL (ref 13.0–17.0)
Lymphocytes Relative: 21.8 % (ref 12.0–46.0)
Lymphs Abs: 1.1 K/uL (ref 0.7–4.0)
MCHC: 32.9 g/dL (ref 30.0–36.0)
MCV: 90.1 fl (ref 78.0–100.0)
Monocytes Absolute: 0.4 K/uL (ref 0.1–1.0)
Monocytes Relative: 8.5 % (ref 3.0–12.0)
Neutro Abs: 3.2 K/uL (ref 1.4–7.7)
Neutrophils Relative %: 63.5 % (ref 43.0–77.0)
Platelets: 140 K/uL — ABNORMAL LOW (ref 150.0–400.0)
RBC: 4.73 Mil/uL (ref 4.22–5.81)
RDW: 13.5 % (ref 11.5–15.5)
WBC: 5.1 K/uL (ref 4.0–10.5)

## 2024-06-04 LAB — LIPID PANEL
Cholesterol: 133 mg/dL (ref 0–200)
HDL: 44 mg/dL (ref >39.00)
LDL Cholesterol: 67 mg/dL (ref 0–99)
NonHDL: 89.22
Total CHOL/HDL Ratio: 3
Triglycerides: 113 mg/dL (ref 0.0–149.0)
VLDL: 22.6 mg/dL (ref 0.0–40.0)

## 2024-06-04 LAB — HEPATIC FUNCTION PANEL
ALT: 17 U/L (ref 0–53)
AST: 20 U/L (ref 0–37)
Albumin: 3.8 g/dL (ref 3.5–5.2)
Alkaline Phosphatase: 58 U/L (ref 39–117)
Bilirubin, Direct: 0.4 mg/dL — ABNORMAL HIGH (ref 0.0–0.3)
Total Bilirubin: 1.7 mg/dL — ABNORMAL HIGH (ref 0.2–1.2)
Total Protein: 6.4 g/dL (ref 6.0–8.3)

## 2024-06-04 LAB — COMPREHENSIVE METABOLIC PANEL WITH GFR
ALT: 17 U/L (ref 0–53)
AST: 20 U/L (ref 0–37)
Albumin: 3.8 g/dL (ref 3.5–5.2)
Alkaline Phosphatase: 58 U/L (ref 39–117)
BUN: 12 mg/dL (ref 6–23)
CO2: 26 meq/L (ref 19–32)
Calcium: 8.6 mg/dL (ref 8.4–10.5)
Chloride: 104 meq/L (ref 96–112)
Creatinine, Ser: 0.71 mg/dL (ref 0.40–1.50)
GFR: 81.02 mL/min (ref >60.00)
Glucose, Bld: 97 mg/dL (ref 70–99)
Potassium: 4.2 meq/L (ref 3.5–5.1)
Sodium: 138 meq/L (ref 135–145)
Total Bilirubin: 1.7 mg/dL — ABNORMAL HIGH (ref 0.2–1.2)
Total Protein: 6.4 g/dL (ref 6.0–8.3)

## 2024-06-04 LAB — BASIC METABOLIC PANEL WITH GFR
BUN: 12 mg/dL (ref 6–23)
CO2: 26 meq/L (ref 19–32)
Calcium: 8.6 mg/dL (ref 8.4–10.5)
Chloride: 104 meq/L (ref 96–112)
Creatinine, Ser: 0.71 mg/dL (ref 0.40–1.50)
GFR: 81.02 mL/min (ref >60.00)
Glucose, Bld: 97 mg/dL (ref 70–99)
Potassium: 4.2 meq/L (ref 3.5–5.1)
Sodium: 138 meq/L (ref 135–145)

## 2024-06-04 LAB — PSA: PSA: 3.31 ng/mL (ref 0.10–4.00)

## 2024-06-04 LAB — HEMOGLOBIN A1C: Hgb A1c MFr Bld: 6.2 % (ref 4.6–6.5)

## 2024-06-04 LAB — VITAMIN D 25 HYDROXY (VIT D DEFICIENCY, FRACTURES): VITD: 40.22 ng/mL (ref 30.00–100.00)

## 2024-06-04 LAB — TSH: TSH: 2.16 u[IU]/mL (ref 0.35–5.50)

## 2024-06-04 MED ORDER — TAMSULOSIN HCL 0.4 MG PO CAPS: 0.4000 mg | ORAL_CAPSULE | Freq: Every day | ORAL | 1 refills | Status: AC

## 2024-06-04 NOTE — Addendum Note (Signed)
 Addended by: ALTO PARODY D on: 06/04/2024 02:09 PM   Modules accepted: Orders

## 2024-06-04 NOTE — Patient Instructions (Signed)
  VISIT SUMMARY: Today, you had a follow-up appointment to review your cardiovascular health and lab results. We discussed your conditions, including your aortic aneurysms, coronary artery disease, hypertension, hyperlipidemia, prediabetes, and other health concerns. Your lab results and imaging studies were reviewed, and your current medications and supplements were assessed.  YOUR PLAN: -BENIGN PROSTATIC HYPERPLASIA WITH LOWER URINARY TRACT SYMPTOMS: Benign prostatic hyperplasia is an enlarged prostate gland that can cause urinary symptoms such as nocturia and increased urgency. We have prescribed tamsulosin  0.4 mg once daily to help relieve these symptoms. A PSA test has been ordered, and if your symptoms persist or worsen, we may refer you to a urologist.  -HYPERTENSION: Hypertension is high blood pressure. Your blood pressure today was 147/67 mmHg. We discussed the importance of accurate home monitoring and recommended using an arm cuff for more precise readings. Please recheck your blood pressure in the office and bring your home blood pressure log to your next visit. If your readings remain elevated, we may need to consider antihypertensive medication.  -ABDOMINAL AORTIC ANEURYSM AND THORACIC AORTIC ANEURYSM, STABLE, WITHOUT RUPTURE: An aortic aneurysm is a bulge in the wall of the aorta. Your aneurysms are stable with no significant changes on recent imaging. You should continue to follow up with vascular surgery every two years.  -ATHEROSCLEROTIC HEART DISEASE OF NATIVE CORONARY ARTERY WITHOUT ANGINA: Atherosclerotic heart disease involves the buildup of plaque in the coronary arteries. Your condition is well-managed without angina. Continue taking aspirin 81 mg daily and atorvastatin  20 mg daily for secondary prevention.  -ATHEROSCLEROSIS OF AORTA: Atherosclerosis of the aorta is the buildup of plaque in the aorta. This condition is managed with your current cardiovascular prevention  strategies, including statin therapy.  -MIXED HYPERLIPIDEMIA: Mixed hyperlipidemia is having high levels of different types of fats in the blood. Your LDL cholesterol is at goal with your current management. Continue taking atorvastatin  20 mg daily.  -PREDIABETES: Prediabetes is a condition where blood sugar levels are higher than normal but not high enough to be classified as diabetes. Your recent hemoglobin A1c is 6.2%, indicating worsening control. We will continue to monitor this closely.  -VITAMIN D  DEFICIENCY, CONTROLLED: Vitamin D  deficiency means having lower than normal levels of vitamin D . Your condition is well-controlled with your current supplementation. Continue taking vitamin D  5000 IU daily.  -GILBERT SYNDROME: Bertrum syndrome is a mild liver disorder that causes an increase in bilirubin levels. Your total bilirubin is stable at 1.7 mg/dL.  INSTRUCTIONS: Please recheck your blood pressure in the office and bring your home blood pressure log to your next visit. A PSA test has been ordered. Continue to follow up with vasc ular surgery every two years.

## 2024-06-04 NOTE — Progress Notes (Signed)
 Assessment & Plan   Assessment/Plan:    Assessment & Plan Benign prostatic hyperplasia with lower urinary tract symptoms Benign prostatic hyperplasia with nocturia and increased urgency. - Prescribe tamsulosin  0.4 mg once daily - Order PSA test - Consider referral to urologist if symptoms persist or worsen  Hypertension Blood pressure elevated at 147/67 mmHg, consistent with recent cardiology visit. Discussed accurate home monitoring and potential need for antihypertensive medication if readings remain elevated. Advised to use an arm cuff for more accurate readings. - Recheck blood pressure in office - Recommend obtaining an arm cuff for home blood pressure monitoring - Bring home blood pressure log to next visit  Abdominal aortic aneurysm and thoracic aortic aneurysm, stable, without rupture Well-managed abdominal and thoracic aortic aneurysms with no significant changes on recent imaging. Cardiologist advised against further imaging due to age and lack of surgical candidacy. - Continue follow-up with vascular surgery every two years  Atherosclerotic heart disease of native coronary artery without angina Well-managed atherosclerotic heart disease without angina. Current management includes aspirin and atorvastatin  for secondary prevention. - Continue aspirin 81 mg daily - Continue atorvastatin  20 mg daily  Atherosclerosis of aorta Aortic atherosclerosis managed with current cardiovascular prevention strategies including statin therapy.  Mixed hyperlipidemia Mixed hyperlipidemia with LDL at goal of 67 mg/dL. Current management with atorvastatin  is effective. - Continue atorvastatin  20 mg daily  Prediabetes Prediabetes with recent hemoglobin A1c of 6.2%, indicating worsening control.  Vitamin D  deficiency, controlled Vitamin D  deficiency is well-controlled with current supplementation regimen. Recent levels are within normal limits. - Continue vitamin D  5000 IU  daily  Bertrum syndrome Gilbert syndrome with stable total bilirubin at 1.7 mg/dL.      There are no discontinued medications.  Return in about 1 month (around 07/05/2024) for BP.        Subjective:   Encounter date: 06/04/2024  Keith [name] is a 89 y.o. male who has Labile hypertension; Hyperlipidemia, mixed; Gastroesophageal reflux disease; Vitamin D  deficiency; Elevated PSA; Prediabetes; Colon cancer (HCC); Non-seasonal allergic rhinitis; Coronary artery disease involving native coronary artery of native heart without angina pectoris; History of rectal cancer; Aortic atherosclerosis (HCC) by Abd CTscan 2019; Thoracic aortic aneurysm (HCC); Chronic vasomotor rhinitis; Chronic idiopathic constipation; Murmur; Abdominal aortic aneurysm (AAA) without rupture (HCC); and Benign prostatic hyperplasia with nocturia on their problem list..   He  has a past medical history of Cancer (HCC), Elevated PSA, GERD (gastroesophageal reflux disease), Hemorrhoids, Hyperlipidemia, Hypertension, Pneumonia, and Vitamin D  deficiency.[name]   He presents with chief complaint of Medical Management of Chronic Issues (6 month follow up //HM due- shingles vaccine (patient declined) ) .   Discussed the use of AI scribe software for clinical note transcription with the patient, who gave verbal consent to proceed.  History of Present Illness Keith [name] is a 89 year old male with thoracic and abdominal aortic aneurysms, coronary artery disease, and hypertension who presents for follow-up of his cardiovascular health and lab results.  He has a history of thoracic aortic aneurysm and abdominal aortic aneurysm without rupture. His last CT scan of the chest, abdomen, and pelvis showed a stable infrarenal abdominal aortic aneurysm, measuring 3.6 x 3.3 cm. He continues to follow up with vascular surgery.  He has coronary artery disease involving the native coronary artery without angina pectoris. For cardiac  secondary prevention, he takes aspirin 81 mg daily and atorvastatin  20 mg daily. His recent lab work shows LDL at goal at 67 mg/dL.  He has a  history of hypertension with current blood pressure readings at home varying from under 130 mmHg to as high as 140-150 mmHg. He uses a wrist blood pressure monitor at home. Today, his blood pressure is 147/67 mmHg.  He has hyperlipidemia and is on atorvastatin  20 mg daily. His cholesterol levels are at goal.  He has prediabetes with a recent hemoglobin A1c of 6.2%, indicating a worsening trend.  He has a history of Gilbert syndrome with a stable total bilirubin level of 1.7 mg/dL. His comprehensive metabolic panel, including electrolytes, creatinine, GFR, AST, ALT, and LFTs, are all within normal limits.  He has a history of vitamin D  deficiency and takes 5000 IU daily. His recent vitamin D  levels are within normal limits.  He reports no abdominal discomfort. He experiences nocturia, going to the bathroom two to three times per night, and feels more of an urge to urinate as he gets older. He has a history of elevated PSA and prostamegaly noted on imaging.  He takes a supplement called Blood Flow 7, which he reports helps with swelling around his ankles. He takes two capsules daily, although the recommendation is three.  He wore a heart monitor for two weeks about six to eight months ago, and he is scheduled for another evaluation next year.     ROS  Past Surgical History:  Procedure Laterality Date   CATARACT EXTRACTION, BILATERAL Bilateral 2021   Dr. Octavia   EUS N/A 06/29/2015   Procedure: LOWER ENDOSCOPIC ULTRASOUND (EUS);  Surgeon: Elsie Cree, MD;  Location: [facility];  Service: Endoscopy;  Laterality: N/A;   HERNIA REPAIR     left inguinal   XI ROBOTIC ASSISTED LOWER ANTERIOR RESECTION N/A 08/02/2015   Procedure: XI ROBOTIC ASSISTED LOWER ANTERIOR RESECTION;  Surgeon: Jina Nephew, MD;  Location: [facility];  Service: General;  Laterality:  N/A;    Outpatient Medications Prior to Visit  Medication Sig Dispense Refill   atorvastatin  (LIPITOR) 20 MG tablet Take  1 tablet  Daily  for Cholesterol                                                          /                                 TAKE BY                                MOUTH                                   ONCE DAILY 90 tablet 3   Cholecalciferol (VITAMIN D  PO) Take 5,000 Int'l Units by mouth every morning.      ipratropium (ATROVENT ) 0.06 % nasal spray USE 1 TO 2 SPRAY(S) IN EACH NOSTRIL TWICE DAILY TO THREE TIMES DAILY AS NEEDED 45 mL 3   Magnesium 250 MG TABS Take 1 tablet by mouth daily.     Omega-3 Fatty Acids (FISH OIL PO) Take 1 tablet by mouth every morning.      triamcinolone  ointment (KENALOG ) 0.1 % Apply 1 Application topically 2 (two) times daily.  80 g 3   Zinc 50 MG TABS Take 1 tablet by mouth daily.     aspirin 81 MG tablet Take 81 mg by mouth every morning.      No facility-administered medications prior to visit.    Family History  Problem Relation Age of Onset   Heart disease Mother    Heart disease Father     Social History   Socioeconomic History   Marital status: Married    Spouse name: Not on file   Number of children: Not on file   Years of education: Not on file   Highest education level: Not on file  Occupational History   Not on file  Tobacco Use   Smoking status: Former    Current packs/day: 0.00    Types: Cigarettes    Quit date: 10/04/1965    Years since quitting: 58.7    Passive exposure: Never   Smokeless tobacco: Never  Vaping Use   Vaping status: Never Used  Substance and Sexual Activity   Alcohol use: Not Currently    Comment: rarely   Drug use: No   Sexual activity: Not Currently  Other Topics Concern   Not on file  Social History Narrative   Married, wife Heron   Retired   Social Drivers of Health   Financial Resource Strain: Low Risk  (05/29/2024)   Overall Financial Resource Strain (CARDIA)    Difficulty  of Paying Living Expenses: Not hard at all  Food Insecurity: No Food Insecurity (05/29/2024)   Hunger Vital Sign    Worried About Running Out of Food in the Last Year: Never true    Ran Out of Food in the Last Year: Never true  Transportation Needs: No Transportation Needs (05/29/2024)   PRAPARE - Administrator, Civil Service (Medical): No    Lack of Transportation (Non-Medical): No  Physical Activity: Sufficiently Active (05/29/2024)   Exercise Vital Sign    Days of Exercise per Week: 6 days    Minutes of Exercise per Session: 40 min  Stress: No Stress Concern Present (05/29/2024)   [employer] of Occupational Health - Occupational Stress Questionnaire    Feeling of Stress: Not at all  Social Connections: Unknown (05/29/2024)   Social Connection and Isolation Panel    Frequency of Communication with Friends and Family: Twice a week    Frequency of Social Gatherings with Friends and Family: Not on file    Attends Religious Services: More than 4 times per year    Active Member of [employer] or Organizations: No    Attends Banker Meetings: Never    Marital Status: Married  Catering manager Violence: Not At Risk (05/29/2024)   Humiliation, Afraid, Rape, and Kick questionnaire    Fear of Current or Ex-Partner: No    Emotionally Abused: No    Physically Abused: No    Sexually Abused: No                                                                                                  Objective:  Physical Exam: BP ROLLEN)  147/67 (BP Location: Right Arm, Patient Position: Sitting, Cuff Size: Normal) Comment: recheck  Pulse (!) 57   Temp (!) 97.1 F (36.2 C) (Temporal)   Resp 18   Wt 161 lb 12.8 oz (73.4 kg)   SpO2 98%   BMI 23.22 kg/m    Physical Exam VITALS: BP- 147/67 GENERAL: Alert, cooperative, well developed, no acute distress HEENT: Normocephalic, normal oropharynx, moist mucous membranes CHEST: Clear to auscultation bilaterally, no wheezes, rhonchi,  or crackles CARDIOVASCULAR: Normal heart rate and rhythm, S1 and S2 normal without murmurs ABDOMEN: Soft, non-tender, non-distended, without organomegaly, normal bowel sounds EXTREMITIES: No cyanosis or edema NEUROLOGICAL: Cranial nerves grossly intact, moves all extremities without gross motor or sensory deficit   Physical Exam  No results found.  Recent Results (from the past 2160 hours)  TSH     Status: None   Collection Time: 06/04/24  8:33 AM  Result Value Ref Range   TSH 2.16 0.35 - 5.50 uIU/mL  Hepatic function panel     Status: Abnormal   Collection Time: 06/04/24  8:33 AM  Result Value Ref Range   Total Bilirubin 1.7 (H) 0.2 - 1.2 mg/dL   Bilirubin, Direct 0.4 (H) 0.0 - 0.3 mg/dL   Alkaline Phosphatase 58 39 - 117 U/L   AST 20 0 - 37 U/L   ALT 17 0 - 53 U/L   Total Protein 6.4 6.0 - 8.3 g/dL   Albumin 3.8 3.5 - 5.2 g/dL  VITAMIN D  25 Hydroxy (Vit-D Deficiency, Fractures)     Status: None   Collection Time: 06/04/24  8:33 AM  Result Value Ref Range   VITD 40.22 30.00 - 100.00 ng/mL  Comp Met (CMET)     Status: Abnormal   Collection Time: 06/04/24  8:33 AM  Result Value Ref Range   Sodium 138 135 - 145 mEq/L   Potassium 4.2 3.5 - 5.1 mEq/L   Chloride 104 96 - 112 mEq/L   CO2 26 19 - 32 mEq/L   Glucose, Bld 97 70 - 99 mg/dL   BUN 12 6 - 23 mg/dL   Creatinine, Ser 9.28 0.40 - 1.50 mg/dL   Total Bilirubin 1.7 (H) 0.2 - 1.2 mg/dL   Alkaline Phosphatase 58 39 - 117 U/L   AST 20 0 - 37 U/L   ALT 17 0 - 53 U/L   Total Protein 6.4 6.0 - 8.3 g/dL   Albumin 3.8 3.5 - 5.2 g/dL   GFR 18.97 >39.99 mL/min    Comment: Calculated using the CKD-EPI Creatinine Equation (2021)   Calcium  8.6 8.4 - 10.5 mg/dL  Hemoglobin J8r     Status: None   Collection Time: 06/04/24  8:33 AM  Result Value Ref Range   Hgb A1c MFr Bld 6.2 4.6 - 6.5 %    Comment: Glycemic Control Guidelines for People with Diabetes:Non Diabetic:  <6%Goal of Therapy: <7%Additional Action Suggested:  >8%   Basic  Metabolic Panel (BMET)     Status: None   Collection Time: 06/04/24  8:33 AM  Result Value Ref Range   Sodium 138 135 - 145 mEq/L   Potassium 4.2 3.5 - 5.1 mEq/L   Chloride 104 96 - 112 mEq/L   CO2 26 19 - 32 mEq/L   Glucose, Bld 97 70 - 99 mg/dL   BUN 12 6 - 23 mg/dL   Creatinine, Ser 9.28 0.40 - 1.50 mg/dL   GFR 18.97 >39.99 mL/min    Comment: Calculated using the CKD-EPI Creatinine Equation (2021)  Calcium  8.6 8.4 - 10.5 mg/dL  Lipid panel     Status: None   Collection Time: 06/04/24  8:33 AM  Result Value Ref Range   Cholesterol 133 0 - 200 mg/dL    Comment: ATP III Classification       Desirable:  < 200 mg/dL               Borderline High:  200 - 239 mg/dL          High:  > = 759 mg/dL   Triglycerides 886.9 0.0 - 149.0 mg/dL    Comment: Normal:  <849 mg/dLBorderline High:  150 - 199 mg/dL   HDL 55.99 >60.99 mg/dL   VLDL 77.3 0.0 - 59.9 mg/dL   LDL Cholesterol 67 0 - 99 mg/dL   Total CHOL/HDL Ratio 3     Comment:                Men          Women1/2 Average Risk     3.4          3.3Average Risk          5.0          4.42X Average Risk          9.6          7.13X Average Risk          15.0          11.0                       NonHDL 89.22     Comment: NOTE:  Non-HDL goal should be 30 mg/dL higher than patient's LDL goal (i.e. LDL goal of < 70 mg/dL, would have non-HDL goal of < 100 mg/dL)  CBC w/Diff     Status: Abnormal   Collection Time: 06/04/24  8:33 AM  Result Value Ref Range   WBC 5.1 4.0 - 10.5 K/uL   RBC 4.73 4.22 - 5.81 Mil/uL   Hemoglobin 14.0 13.0 - 17.0 g/dL   HCT 57.3 60.9 - 47.9 %   MCV 90.1 78.0 - 100.0 fl   MCHC 32.9 30.0 - 36.0 g/dL   RDW 86.4 88.4 - 84.4 %   Platelets 140.0 (L) 150.0 - 400.0 K/uL   Neutrophils Relative % 63.5 43.0 - 77.0 %   Lymphocytes Relative 21.8 12.0 - 46.0 %   Monocytes Relative 8.5 3.0 - 12.0 %   Eosinophils Relative 5.7 (H) 0.0 - 5.0 %   Basophils Relative 0.5 0.0 - 3.0 %   Neutro Abs 3.2 1.4 - 7.7 K/uL   Lymphs Abs 1.1 0.7 -  4.0 K/uL   Monocytes Absolute 0.4 0.1 - 1.0 K/uL   Eosinophils Absolute 0.3 0.0 - 0.7 K/uL   Basophils Absolute 0.0 0.0 - 0.1 K/uL  PSA     Status: None   Collection Time: 06/04/24  2:09 PM  Result Value Ref Range   PSA 3.31 0.10 - 4.00 ng/mL    Comment: Test performed using Access [employer]br[employer]ch PSA Assay, a parmagnetic partical, chemiluminecent immunoassay.        Beverley Adine Hummer, MD, MS

## 2024-06-05 ENCOUNTER — Ambulatory Visit: Payer: Self-pay | Admitting: Family Medicine

## 2024-06-05 LAB — URINALYSIS W MICROSCOPIC + REFLEX CULTURE
Bacteria, UA: NONE SEEN /HPF
Bilirubin Urine: NEGATIVE
Glucose, UA: NEGATIVE
Hgb urine dipstick: NEGATIVE
Hyaline Cast: NONE SEEN /LPF
Ketones, ur: NEGATIVE
Leukocyte Esterase: NEGATIVE
Nitrites, Initial: NEGATIVE
Protein, ur: NEGATIVE
RBC / HPF: NONE SEEN /HPF (ref 0–2)
Specific Gravity, Urine: 1.02 (ref 1.001–1.035)
Squamous Epithelial / HPF: NONE SEEN /HPF (ref <=5)
WBC, UA: NONE SEEN /HPF (ref 0–5)
pH: 6.5 (ref 5.0–8.0)

## 2024-06-05 LAB — NO CULTURE INDICATED

## 2024-07-08 ENCOUNTER — Encounter: Payer: Self-pay | Admitting: Family Medicine

## 2024-07-08 ENCOUNTER — Ambulatory Visit (INDEPENDENT_AMBULATORY_CARE_PROVIDER_SITE_OTHER): Admitting: Family Medicine

## 2024-07-08 VITALS — BP 151/78 | HR 63 | Temp 96.8°F | Ht 70.0 in | Wt 161.4 lb

## 2024-07-08 DIAGNOSIS — I7121 Aneurysm of the ascending aorta, without rupture: Secondary | ICD-10-CM

## 2024-07-08 DIAGNOSIS — R0989 Other specified symptoms and signs involving the circulatory and respiratory systems: Secondary | ICD-10-CM | POA: Diagnosis not present

## 2024-07-08 DIAGNOSIS — Z85048 Personal history of other malignant neoplasm of rectum, rectosigmoid junction, and anus: Secondary | ICD-10-CM | POA: Diagnosis not present

## 2024-07-08 DIAGNOSIS — I2510 Atherosclerotic heart disease of native coronary artery without angina pectoris: Secondary | ICD-10-CM | POA: Diagnosis not present

## 2024-07-08 DIAGNOSIS — I700 Atherosclerosis of aorta: Secondary | ICD-10-CM

## 2024-07-08 DIAGNOSIS — E782 Mixed hyperlipidemia: Secondary | ICD-10-CM | POA: Diagnosis not present

## 2024-07-08 DIAGNOSIS — I714 Abdominal aortic aneurysm, without rupture, unspecified: Secondary | ICD-10-CM

## 2024-07-08 NOTE — Progress Notes (Signed)
 Assessment & Plan   Assessment/Plan:    Assessment & Plan Hypertension with white coat effect Blood pressure readings at home are mostly within the goal range of 120s to 130s systolic, with occasional readings up to 145. Office readings show mildly elevated systolic pressure, likely due to white coat effect. Diastolic pressures are within normal range. No antihypertensive medication is currently prescribed. The goal is to maintain blood pressure under 140/90, considering age and comorbidities. - Continue home blood pressure monitoring, ensuring both systolic and diastolic readings are recorded. - Maintain tight moderation of risk factors including blood pressure, cholesterol, blood sugar, and diet management.  Aortic atherosclerosis and coronary artery disease Aspirin 81 mg daily and atorvastatin  20 mg are used for cardiovascular disease prevention and cholesterol management. Heart health is well-managed with some potential blockage, but overall good for age. - Continue aspirin 81 mg daily. - Continue atorvastatin  20 mg daily. - Consider exercise program closer to next cardiology visit.  Abdominal and thoracic aortic aneurysms He has both abdominal and thoracic aortic aneurysms. He is not a candidate for surgery for the thoracic aneurysm due to age, but abdominal aneurysm monitoring continues every two years.  Peripheral edema Peripheral edema is managed with diuretics, taking two pills daily instead of the prescribed three, which is effective. He prefers this regimen over wearing compression stockings. - Continue current diuretic regimen of two pills daily.  Rectal cancer He had a colonoscopy 10 years ago and a Cologram 4-5 years ago. He expresses interest in another colonoscopy due to rectal cancer. Willing to pursue aggressive treatment if necessary. - Refer to gastroenterology for evaluation and potential colonoscopy.      There are no discontinued medications.  Return in  about 6 months (around 01/06/2025) for BP, physical (fasting labs).        Subjective:   Encounter date: 07/08/2024  Keith [name] is a 89 y.o. male who has Labile hypertension; Hyperlipidemia, mixed; Gastroesophageal reflux disease; Vitamin D  deficiency; Prediabetes; Non-seasonal allergic rhinitis; Coronary artery disease involving native coronary artery of native heart without angina pectoris; History of rectal cancer; Aortic atherosclerosis (HCC) by Abd CTscan 2019; Thoracic aortic aneurysm; Chronic vasomotor rhinitis; Chronic idiopathic constipation; Murmur; Abdominal aortic aneurysm (AAA) without rupture; and Benign prostatic hyperplasia with nocturia on their problem list..   He  has a past medical history of Cancer [facility]), Colon cancer (HCC) (07/15/2015), Elevated PSA, GERD (gastroesophageal reflux disease), Hemorrhoids, Hyperlipidemia, Hypertension, Pneumonia, and Vitamin D  deficiency.[name]   He presents with chief complaint of Hypertension (Pt presents today for a BP Check. Pt is in compliance ith checking his bp.) .   Discussed the use of AI scribe software for clinical note transcription with the patient, who gave verbal consent to proceed.  History of Present Illness Keith [name] is a 89 year old male with thoracic and abdominal aortic aneurysms, coronary artery disease, and hypertension who presents for follow-up of his cardiovascular health and lab results.  He has a history of thoracic aortic aneurysm and abdominal aortic aneurysm without rupture. His last CT scan of the chest, abdomen, and pelvis showed a stable infrarenal abdominal aortic aneurysm, measuring 3.6 x 3.3 cm. He continues to follow up with vascular surgery.  He has coronary artery disease involving the native coronary artery without angina pectoris. For cardiac secondary prevention, he takes aspirin 81 mg daily and atorvastatin  20 mg daily. His recent lab work shows LDL at goal at 67 mg/dL.  He has a history of  hypertension with  current blood pressure readings at home varying from under 130 mmHg to as high as 140-150 mmHg. He uses a wrist blood pressure monitor at home. Today, his blood pressure is 147/67 mmHg.  He has hyperlipidemia and is on atorvastatin  20 mg daily. His cholesterol levels are at goal.  He has prediabetes with a recent hemoglobin A1c of 6.2%, indicating a worsening trend.  He has a history of Gilbert syndrome with a stable total bilirubin level of 1.7 mg/dL. His comprehensive metabolic panel, including electrolytes, creatinine, GFR, AST, ALT, and LFTs, are all within normal limits.  He has a history of vitamin D  deficiency and takes 5000 IU daily. His recent vitamin D  levels are within normal limits.  He reports no abdominal discomfort. He experiences nocturia, going to the bathroom two to three times per night, and feels more of an urge to urinate as he gets older. He has a history of elevated PSA and prostamegaly noted on imaging.  He takes a supplement called Blood Flow 7, which he reports helps with swelling around his ankles. He takes two capsules daily, although the recommendation is three.  He wore a heart monitor for two weeks about six to eight months ago, and he is scheduled for another evaluation next year.   Keith [name] is a 89 year old male with hypertension and aortic aneurysms who presents for blood pressure follow-up.  Hypertension - Blood pressure monitored at home with systolic readings ranging from 116 to 145 mmHg, predominantly in the 120s and 130s - No current antihypertensive medication - Office blood pressure today is 150/73 mmHg  Aortic and coronary artery disease - Abdominal aortic aneurysm and thoracic aortic aneurysm - Aortic atherosclerosis - Coronary artery disease - Aspirin 81 mg daily for cardiovascular disease prevention - Atorvastatin  20 mg daily for cholesterol management  Peripheral edema - Leg swelling managed with medication taken  twice daily, though advised to take three times daily - Finds current regimen effective and prefers it over compression stockings  Colorectal cancer surveillance - History of rectal cancer - Concern regarding possible recurrence - Colonoscopy performed ten years ago - Cologram performed four to five years ago  Dietary habits - Diet consists of minimal fried foods and red meats - Increased consumption of chicken and pork     ROS  Past Surgical History:  Procedure Laterality Date   CATARACT EXTRACTION, BILATERAL Bilateral 2021   Dr. Octavia   EUS N/A 06/29/2015   Procedure: LOWER ENDOSCOPIC ULTRASOUND (EUS);  Surgeon: Elsie Cree, MD;  Location: [facility];  Service: Endoscopy;  Laterality: N/A;   HERNIA REPAIR     left inguinal   XI ROBOTIC ASSISTED LOWER ANTERIOR RESECTION N/A 08/02/2015   Procedure: XI ROBOTIC ASSISTED LOWER ANTERIOR RESECTION;  Surgeon: Jina Nephew, MD;  Location: [facility];  Service: General;  Laterality: N/A;    Outpatient Medications Prior to Visit  Medication Sig Dispense Refill   aspirin 81 MG tablet Take 81 mg by mouth every morning.      atorvastatin  (LIPITOR) 20 MG tablet Take  1 tablet  Daily  for Cholesterol                                                          /  TAKE BY                                MOUTH                                   ONCE DAILY 90 tablet 3   Cholecalciferol (VITAMIN D  PO) Take 5,000 Int'l Units by mouth every morning.      ipratropium (ATROVENT ) 0.06 % nasal spray USE 1 TO 2 SPRAY(S) IN EACH NOSTRIL TWICE DAILY TO THREE TIMES DAILY AS NEEDED 45 mL 3   Magnesium 250 MG TABS Take 1 tablet by mouth daily.     Omega-3 Fatty Acids (FISH OIL PO) Take 1 tablet by mouth every morning.      tamsulosin  (FLOMAX ) 0.4 MG CAPS capsule Take 1 capsule (0.4 mg total) by mouth daily. 90 capsule 1   triamcinolone  ointment (KENALOG ) 0.1 % Apply 1 Application topically 2 (two) times daily. 80 g 3   Zinc 50 MG  TABS Take 1 tablet by mouth daily.     No facility-administered medications prior to visit.    Family History  Problem Relation Age of Onset   Heart disease Mother    Heart disease Father     Social History   Socioeconomic History   Marital status: Married    Spouse name: Not on file   Number of children: Not on file   Years of education: Not on file   Highest education level: Not on file  Occupational History   Not on file  Tobacco Use   Smoking status: Former    Current packs/day: 0.00    Types: Cigarettes    Quit date: 10/04/1965    Years since quitting: 58.8    Passive exposure: Never   Smokeless tobacco: Never  Vaping Use   Vaping status: Never Used  Substance and Sexual Activity   Alcohol use: Not Currently    Comment: rarely   Drug use: No   Sexual activity: Not Currently  Other Topics Concern   Not on file  Social History Narrative   Married, wife Heron   Retired   Social Drivers of Health   Financial Resource Strain: Low Risk  (05/29/2024)   Overall Financial Resource Strain (CARDIA)    Difficulty of Paying Living Expenses: Not hard at all  Food Insecurity: No Food Insecurity (05/29/2024)   Hunger Vital Sign    Worried About Running Out of Food in the Last Year: Never true    Ran Out of Food in the Last Year: Never true  Transportation Needs: No Transportation Needs (05/29/2024)   PRAPARE - Administrator, Civil Service (Medical): No    Lack of Transportation (Non-Medical): No  Physical Activity: Sufficiently Active (05/29/2024)   Exercise Vital Sign    Days of Exercise per Week: 6 days    Minutes of Exercise per Session: 40 min  Stress: No Stress Concern Present (05/29/2024)   [employer] of Occupational Health - Occupational Stress Questionnaire    Feeling of Stress: Not at all  Social Connections: Unknown (05/29/2024)   Social Connection and Isolation Panel    Frequency of Communication with Friends and Family: Twice a week     Frequency of Social Gatherings with Friends and Family: Not on file    Attends Religious Services: More than 4 times per year  Active Member of Clubs or Organizations: No    Attends Banker Meetings: Never    Marital Status: Married  Catering manager Violence: Not At Risk (05/29/2024)   Humiliation, Afraid, Rape, and Kick questionnaire    Fear of Current or Ex-Partner: No    Emotionally Abused: No    Physically Abused: No    Sexually Abused: No                                                                                                  Objective:  Physical Exam: BP (!) 151/78 (BP Location: Left Wrist, Patient Position: Sitting, Cuff Size: Normal)   Pulse 63   Temp (!) 96.8 F (36 C) (Temporal)   Ht 5' 10 (1.778 m)   Wt 161 lb 6.4 oz (73.2 kg)   SpO2 97%   BMI 23.16 kg/m    Physical Exam VITALS: BP- 147/67 GENERAL: Alert, cooperative, well developed, no acute distress HEENT: Normocephalic, normal oropharynx, moist mucous membranes CHEST: Clear to auscultation bilaterally, no wheezes, rhonchi, or crackles CARDIOVASCULAR: Normal heart rate and rhythm, S1 and S2 normal without murmurs ABDOMEN: Soft, non-tender, non-distended, without organomegaly, normal bowel sounds EXTREMITIES: No cyanosis or edema NEUROLOGICAL: Cranial nerves grossly intact, moves all extremities without gross motor or sensory deficit   VITALS: BP- 150/73 GENERAL: Alert, cooperative, well developed, no acute distress HEENT: Normocephalic, normal oropharynx, moist mucous membranes CHEST: Clear to auscultation bilaterally, no wheezes, rhonchi, or crackles CARDIOVASCULAR: Normal heart rate and rhythm, S1 and S2 normal without murmurs ABDOMEN: Soft, non-tender, non-distended, without organomegaly, normal bowel sounds EXTREMITIES: No cyanosis, edema, or leg swelling NEUROLOGICAL: Cranial nerves grossly intact, moves all extremities without gross motor or sensory deficit   Physical  Exam  No results found.  Recent Results (from the past 2160 hours)  TSH     Status: None   Collection Time: 06/04/24  8:33 AM  Result Value Ref Range   TSH 2.16 0.35 - 5.50 uIU/mL  Hepatic function panel     Status: Abnormal   Collection Time: 06/04/24  8:33 AM  Result Value Ref Range   Total Bilirubin 1.7 (H) 0.2 - 1.2 mg/dL   Bilirubin, Direct 0.4 (H) 0.0 - 0.3 mg/dL   Alkaline Phosphatase 58 39 - 117 U/L   AST 20 0 - 37 U/L   ALT 17 0 - 53 U/L   Total Protein 6.4 6.0 - 8.3 g/dL   Albumin 3.8 3.5 - 5.2 g/dL  VITAMIN D  25 Hydroxy (Vit-D Deficiency, Fractures)     Status: None   Collection Time: 06/04/24  8:33 AM  Result Value Ref Range   VITD 40.22 30.00 - 100.00 ng/mL  Comp Met (CMET)     Status: Abnormal   Collection Time: 06/04/24  8:33 AM  Result Value Ref Range   Sodium 138 135 - 145 mEq/L   Potassium 4.2 3.5 - 5.1 mEq/L   Chloride 104 96 - 112 mEq/L   CO2 26 19 - 32 mEq/L   Glucose, Bld 97 70 - 99 mg/dL   BUN 12 6 - 23 mg/dL  Creatinine, Ser 0.71 0.40 - 1.50 mg/dL   Total Bilirubin 1.7 (H) 0.2 - 1.2 mg/dL   Alkaline Phosphatase 58 39 - 117 U/L   AST 20 0 - 37 U/L   ALT 17 0 - 53 U/L   Total Protein 6.4 6.0 - 8.3 g/dL   Albumin 3.8 3.5 - 5.2 g/dL   GFR 18.97 >39.99 mL/min    Comment: Calculated using the CKD-EPI Creatinine Equation (2021)   Calcium  8.6 8.4 - 10.5 mg/dL  Hemoglobin J8r     Status: None   Collection Time: 06/04/24  8:33 AM  Result Value Ref Range   Hgb A1c MFr Bld 6.2 4.6 - 6.5 %    Comment: Glycemic Control Guidelines for People with Diabetes:Non Diabetic:  <6%Goal of Therapy: <7%Additional Action Suggested:  >8%   Basic Metabolic Panel (BMET)     Status: None   Collection Time: 06/04/24  8:33 AM  Result Value Ref Range   Sodium 138 135 - 145 mEq/L   Potassium 4.2 3.5 - 5.1 mEq/L   Chloride 104 96 - 112 mEq/L   CO2 26 19 - 32 mEq/L   Glucose, Bld 97 70 - 99 mg/dL   BUN 12 6 - 23 mg/dL   Creatinine, Ser 9.28 0.40 - 1.50 mg/dL   GFR 18.97  >39.99 mL/min    Comment: Calculated using the CKD-EPI Creatinine Equation (2021)   Calcium  8.6 8.4 - 10.5 mg/dL  Lipid panel     Status: None   Collection Time: 06/04/24  8:33 AM  Result Value Ref Range   Cholesterol 133 0 - 200 mg/dL    Comment: ATP III Classification       Desirable:  < 200 mg/dL               Borderline High:  200 - 239 mg/dL          High:  > = 759 mg/dL   Triglycerides 886.9 0.0 - 149.0 mg/dL    Comment: Normal:  <849 mg/dLBorderline High:  150 - 199 mg/dL   HDL 55.99 >60.99 mg/dL   VLDL 77.3 0.0 - 59.9 mg/dL   LDL Cholesterol 67 0 - 99 mg/dL   Total CHOL/HDL Ratio 3     Comment:                Men          Women1/2 Average Risk     3.4          3.3Average Risk          5.0          4.42X Average Risk          9.6          7.13X Average Risk          15.0          11.0                       NonHDL 89.22     Comment: NOTE:  Non-HDL goal should be 30 mg/dL higher than patient's LDL goal (i.e. LDL goal of < 70 mg/dL, would have non-HDL goal of < 100 mg/dL)  CBC w/Diff     Status: Abnormal   Collection Time: 06/04/24  8:33 AM  Result Value Ref Range   WBC 5.1 4.0 - 10.5 K/uL   RBC 4.73 4.22 - 5.81 Mil/uL   Hemoglobin 14.0 13.0 - 17.0 g/dL  HCT 42.6 39.0 - 52.0 %   MCV 90.1 78.0 - 100.0 fl   MCHC 32.9 30.0 - 36.0 g/dL   RDW 86.4 88.4 - 84.4 %   Platelets 140.0 (L) 150.0 - 400.0 K/uL   Neutrophils Relative % 63.5 43.0 - 77.0 %   Lymphocytes Relative 21.8 12.0 - 46.0 %   Monocytes Relative 8.5 3.0 - 12.0 %   Eosinophils Relative 5.7 (H) 0.0 - 5.0 %   Basophils Relative 0.5 0.0 - 3.0 %   Neutro Abs 3.2 1.4 - 7.7 K/uL   Lymphs Abs 1.1 0.7 - 4.0 K/uL   Monocytes Absolute 0.4 0.1 - 1.0 K/uL   Eosinophils Absolute 0.3 0.0 - 0.7 K/uL   Basophils Absolute 0.0 0.0 - 0.1 K/uL  PSA     Status: None   Collection Time: 06/04/24  2:09 PM  Result Value Ref Range   PSA 3.31 0.10 - 4.00 ng/mL    Comment: Test performed using Access Hybritech PSA Assay, a parmagnetic  partical, chemiluminecent immunoassay.  Urinalysis w microscopic + reflex cultur     Status: None   Collection Time: 06/04/24  2:56 PM   Specimen: Urine  Result Value Ref Range   Color, Urine DARK YELLOW YELLOW   APPearance CLEAR CLEAR   Specific Gravity, Urine 1.020 1.001 - 1.035   pH 6.5 5.0 - 8.0   Glucose, UA NEGATIVE NEGATIVE   Bilirubin Urine NEGATIVE NEGATIVE   Ketones, ur NEGATIVE NEGATIVE   Hgb urine dipstick NEGATIVE NEGATIVE   Protein, ur NEGATIVE NEGATIVE   Nitrites, Initial NEGATIVE NEGATIVE   Leukocyte Esterase NEGATIVE NEGATIVE   WBC, UA NONE SEEN 0 - 5 /HPF   RBC / HPF NONE SEEN 0 - 2 /HPF   Squamous Epithelial / HPF NONE SEEN < OR = 5 /HPF   Bacteria, UA NONE SEEN NONE SEEN /HPF   Hyaline Cast NONE SEEN NONE SEEN /LPF   Note      Comment: This urine was analyzed for the presence of WBC,  RBC, bacteria, casts, and other formed elements.  Only those elements seen were reported. . .   REFLEXIVE URINE CULTURE     Status: None   Collection Time: 06/04/24  2:56 PM  Result Value Ref Range   Reflexve Urine Culture      Comment: NO CULTURE INDICATED        Beverley Adine Hummer, MD, MS

## 2024-07-29 ENCOUNTER — Ambulatory Visit: Admitting: Dermatology

## 2024-09-23 ENCOUNTER — Encounter: Payer: Self-pay | Admitting: Gastroenterology

## 2024-09-23 ENCOUNTER — Ambulatory Visit: Admitting: Gastroenterology

## 2024-09-23 NOTE — Progress Notes (Deleted)
 Keith [name] [id number] 02-02-1932   Chief Complaint:  Referring Provider: Sebastian Beverley NOVAK, MD Primary GI MD:   HPI: Keith [name] is a 89 y.o. male with past medical history of rectal cancer 2016 s/p lower anterior resection, GERD, hemorrhoids, HLD, HTN, ascending aortic aneurysm, CAD who presents today to discuss colonoscopy.  Patient referred by PCP to discuss colonoscopy, history of rectal cancer.  Colonoscopy done 10 years ago and had virtual colonoscopy in 2021.  Has previously followed with Dr. Burnette at GI.  Last seen by cardiology 02/24/2024 as new patient for evaluation of heart murmur.  AI and MR suspected.  Echocardiogram ordered (has not been completed).  Stress test advised for chest pain in setting of coronary and aortic atherosclerosis.  AAA and TAA monitored by vascular.  Patient opted to cancel stress test and echocardiogram, per chart review was planning to get them done prior to follow-up with cardiology next year.  Discussed the use of AI scribe software for clinical note transcription with the patient, who gave verbal consent to proceed.  History of Present Illness       Previous GI Procedures/Imaging   CT virtual colonoscopy 10/23/2019 IMPRESSION: Status post left hemicolectomy.   No significant colonic polyp, mass, apple core lesion, or stricture.   3.3 cm infrarenal abdominal aortic aneurysm. Recommend followup by ultrasound in 3 years. This recommendation follows ACR consensus guidelines: White Paper of the ACR Incidental Findings Committee II on Vascular Findings. J Am Coll Radiol 2013; 10:789-794. Aortic aneurysm NOS (ICD10-I71.9)   Additional ancillary findings as above.  Lower EUS 06/29/2015 FINDINGS: External hemorrhoids, otherwise normal digital rectal examination; no palpable mass. Polypectomy scar noted for distal rectal polyp without residual polyp tissue identified. Ulcerated mass seen near 20cm from the anal verge, consistent  with partial prior polypectomy site, with neighboring tattoo markings noted. After instilling water within the colonic lumen to facilitate acoustic coupling, the lesion was visualized via endorectal ultrasound. Due to angulation, I was unable to traverse the entire lesion, but was able to visualize the majority of the lesion under ultrasound. Visualized portion of the lesion appears to be confined to the muscularis propria. No perilesional adenopathy was seen. Please note that cautery artifact can distort tissue layers, and makes both the sensitivity and specificity of endorectal ultrasound in this setting less accurate.  STAGING: T2 N0 Mx via endorectal ultrasound  Past Medical History:  Diagnosis Date   Cancer (HCC)    cancer of recto-sigmoid junction-dx 07/2015   Colon cancer (HCC) 07/15/2015   Elevated PSA    GERD (gastroesophageal reflux disease)    Hemorrhoids    Hyperlipidemia    Hypertension    Pneumonia    age 24   Vitamin D  deficiency     Past Surgical History:  Procedure Laterality Date   CATARACT EXTRACTION, BILATERAL Bilateral 2021   Dr. Octavia   EUS N/A 06/29/2015   Procedure: LOWER ENDOSCOPIC ULTRASOUND (EUS);  Surgeon: Elsie Burnette, MD;  Location: [facility];  Service: Endoscopy;  Laterality: N/A;   HERNIA REPAIR     left inguinal   XI ROBOTIC ASSISTED LOWER ANTERIOR RESECTION N/A 08/02/2015   Procedure: XI ROBOTIC ASSISTED LOWER ANTERIOR RESECTION;  Surgeon: Jina Nephew, MD;  Location: [facility];  Service: General;  Laterality: N/A;    Current Outpatient Medications  Medication Sig Dispense Refill   aspirin 81 MG tablet Take 81 mg by mouth every morning.      atorvastatin  (LIPITOR) 20 MG tablet Take  1 tablet  Daily  for Cholesterol                                                          /                                 TAKE BY                                MOUTH                                   ONCE DAILY 90 tablet 3   Cholecalciferol (VITAMIN D  PO) Take 5,000  Int'l Units by mouth every morning.      ipratropium (ATROVENT ) 0.06 % nasal spray USE 1 TO 2 SPRAY(S) IN EACH NOSTRIL TWICE DAILY TO THREE TIMES DAILY AS NEEDED 45 mL 3   Magnesium 250 MG TABS Take 1 tablet by mouth daily.     Omega-3 Fatty Acids (FISH OIL PO) Take 1 tablet by mouth every morning.      tamsulosin  (FLOMAX ) 0.4 MG CAPS capsule Take 1 capsule (0.4 mg total) by mouth daily. 90 capsule 1   triamcinolone  ointment (KENALOG ) 0.1 % Apply 1 Application topically 2 (two) times daily. 80 g 3   Zinc 50 MG TABS Take 1 tablet by mouth daily.     No current facility-administered medications for this visit.    Allergies as of 09/23/2024 - Review Complete 07/08/2024  Allergen Reaction Noted   Penicillins  09/13/2013    Family History  Problem Relation Age of Onset   Heart disease Mother    Heart disease Father     Social History[1]   Review of Systems:    Constitutional: No weight loss, fever, chills, weakness or fatigue Eyes: No change in vision Ears, Nose, Throat:  No change in hearing or congestion Skin: No rash or itching Cardiovascular: No chest pain, chest pressure or palpitations   Respiratory: No SOB or cough Gastrointestinal: See HPI and otherwise negative Genitourinary: No dysuria or change in urinary frequency Neurological: No headache, dizziness or syncope Musculoskeletal: No new muscle or joint pain Hematologic: No bleeding or bruising    Physical Exam:  Vital signs: There were no vitals taken for this visit.  Constitutional: NAD, Well developed, Well nourished, alert and cooperative Head:  Normocephalic and atraumatic.  Eyes: No scleral icterus. Conjunctiva pink. Mouth: No oral lesions. Respiratory: Respirations even and unlabored. Lungs clear to auscultation bilaterally.  No wheezes, crackles, or rhonchi.  Cardiovascular:  Regular rate and rhythm. No murmurs. No peripheral edema. Gastrointestinal:  Soft, nondistended, nontender. No rebound or guarding.  Normal bowel sounds. No appreciable masses or hepatomegaly. Rectal:  Not performed.  Neurologic:  Alert and oriented x4;  grossly normal neurologically.  Skin:   Dry and intact without significant lesions or rashes. Psychiatric: Oriented to person, place and time. Demonstrates good judgement and reason without abnormal affect or behaviors.   RELEVANT LABS AND IMAGING: CBC    Component Value Date/Time   WBC 5.1 06/04/2024 0833   RBC 4.73 06/04/2024 0833   HGB  14.0 06/04/2024 0833   HGB 14.2 02/23/2016 0930   HCT 42.6 06/04/2024 0833   HCT 42.9 02/23/2016 0930   PLT 140.0 (L) 06/04/2024 0833   PLT 126 (L) 02/23/2016 0930   MCV 90.1 06/04/2024 0833   MCV 100.4 (H) 02/23/2016 0930   MCH 29.5 08/21/2023 1447   MCHC 32.9 06/04/2024 0833   RDW 13.5 06/04/2024 0833   RDW 16.7 (H) 02/23/2016 0930   LYMPHSABS 1.1 06/04/2024 0833   LYMPHSABS 1.6 02/23/2016 0930   MONOABS 0.4 06/04/2024 0833   MONOABS 0.5 02/23/2016 0930   EOSABS 0.3 06/04/2024 0833   EOSABS 0.2 02/23/2016 0930   BASOSABS 0.0 06/04/2024 0833   BASOSABS 0.0 02/23/2016 0930    CMP     Component Value Date/Time   NA 138 06/04/2024 0833   NA 138 06/04/2024 0833   NA 140 09/10/2017 1037   K 4.2 06/04/2024 0833   K 4.2 06/04/2024 0833   K 4.3 09/10/2017 1037   CL 104 06/04/2024 0833   CL 104 06/04/2024 0833   CO2 26 06/04/2024 0833   CO2 26 06/04/2024 0833   CO2 28 09/10/2017 1037   GLUCOSE 97 06/04/2024 0833   GLUCOSE 97 06/04/2024 0833   GLUCOSE 90 09/10/2017 1037   BUN 12 06/04/2024 0833   BUN 12 06/04/2024 0833   BUN 13.3 09/10/2017 1037   CREATININE 0.71 06/04/2024 0833   CREATININE 0.71 06/04/2024 0833   CREATININE 0.70 08/21/2023 1447   CREATININE 0.8 09/10/2017 1037   CALCIUM  8.6 06/04/2024 0833   CALCIUM  8.6 06/04/2024 0833   CALCIUM  9.3 09/10/2017 1037   PROT 6.4 06/04/2024 0833   PROT 6.4 06/04/2024 0833   PROT 6.3 (L) 02/23/2016 0930   ALBUMIN 3.8 06/04/2024 0833   ALBUMIN 3.8 06/04/2024  0833   ALBUMIN 3.5 02/23/2016 0930   AST 20 06/04/2024 0833   AST 20 06/04/2024 0833   AST 22 02/23/2016 0930   ALT 17 06/04/2024 0833   ALT 17 06/04/2024 0833   ALT 15 02/23/2016 0930   ALKPHOS 58 06/04/2024 0833   ALKPHOS 58 06/04/2024 0833   ALKPHOS 62 02/23/2016 0930   BILITOT 1.7 (H) 06/04/2024 0833   BILITOT 1.7 (H) 06/04/2024 0833   BILITOT 1.72 (H) 02/23/2016 0930   GFRNONAA >60 01/20/2023 1652   GFRNONAA 85 01/23/2021 1139   GFRAA 98 01/23/2021 1139     Assessment/Plan:   Assessment & Plan   Request past GI records     Camie Furbish, PA-C [facility] 09/23/2024, 12:59 PM  Patient Care Team: Sebastian Beverley NOVAK, MD as PCP - General (Family Medicine) Elmira Newman PARAS, MD as PCP - Cardiology (Cardiology) Kenn Dunnings, OD (Optometry) Matilda Senior, MD as Consulting Physician (Urology) Arvell Norleen LABOR, MD as Referring Physician (Orthopedic Surgery) Steva Devere CROME, RN as Registered Nurse (Medical Oncology) Cloretta Arley NOVAK, MD as Consulting Physician (Oncology) Burnette Fallow, MD as Consulting Physician (Gastroenterology) Serene Gaile ORN, MD as Consulting Physician (Vascular Surgery) Lucas Dorise POUR, MD as Consulting Physician (Cardiothoracic Surgery)      [1]  Social History Tobacco Use   Smoking status: Former    Current packs/day: 0.00    Types: Cigarettes    Quit date: 10/04/1965    Years since quitting: 59.0    Passive exposure: Never   Smokeless tobacco: Never  Vaping Use   Vaping status: Never Used  Substance Use Topics   Alcohol use: Not Currently    Comment: rarely   Drug use: No

## 2024-10-02 ENCOUNTER — Other Ambulatory Visit: Payer: Self-pay | Admitting: Family Medicine

## 2024-10-02 DIAGNOSIS — Z79899 Other long term (current) drug therapy: Secondary | ICD-10-CM

## 2024-10-30 ENCOUNTER — Ambulatory Visit: Admitting: Gastroenterology

## 2024-11-09 ENCOUNTER — Ambulatory Visit: Admitting: Gastroenterology

## 2024-11-16 ENCOUNTER — Ambulatory Visit: Admitting: Gastroenterology

## 2025-06-04 ENCOUNTER — Ambulatory Visit
